# Patient Record
Sex: Female | Born: 1960 | Race: Black or African American | Hispanic: No | Marital: Single | Smoking: Never smoker
Health system: Southern US, Community
[De-identification: ages and names within clinical notes are randomized; demographics above are authoritative.]

## PROBLEM LIST (undated history)

## (undated) DIAGNOSIS — I499 Cardiac arrhythmia, unspecified: Secondary | ICD-10-CM

## (undated) DIAGNOSIS — K219 Gastro-esophageal reflux disease without esophagitis: Secondary | ICD-10-CM

## (undated) DIAGNOSIS — D649 Anemia, unspecified: Secondary | ICD-10-CM

## (undated) DIAGNOSIS — I10 Essential (primary) hypertension: Secondary | ICD-10-CM

## (undated) DIAGNOSIS — R51 Headache: Secondary | ICD-10-CM

## (undated) DIAGNOSIS — F419 Anxiety disorder, unspecified: Secondary | ICD-10-CM

## (undated) HISTORY — PX: FOOT SURGERY: SHX648

## (undated) HISTORY — DX: Essential (primary) hypertension: I10

## (undated) HISTORY — PX: WISDOM TOOTH EXTRACTION: SHX21

## (undated) HISTORY — PX: COLPOSCOPY: SHX161

## (undated) HISTORY — DX: Headache: R51

---

## 1998-07-12 ENCOUNTER — Inpatient Hospital Stay (HOSPITAL_COMMUNITY): Admission: AD | Admit: 1998-07-12 | Discharge: 1998-07-15 | Payer: Self-pay | Admitting: Obstetrics and Gynecology

## 1999-01-28 ENCOUNTER — Other Ambulatory Visit: Admission: RE | Admit: 1999-01-28 | Discharge: 1999-01-28 | Payer: Self-pay | Admitting: Obstetrics and Gynecology

## 1999-02-18 ENCOUNTER — Emergency Department (HOSPITAL_COMMUNITY): Admission: EM | Admit: 1999-02-18 | Discharge: 1999-02-18 | Payer: Self-pay | Admitting: Emergency Medicine

## 1999-02-21 ENCOUNTER — Encounter: Admission: RE | Admit: 1999-02-21 | Discharge: 1999-02-21 | Payer: Self-pay | Admitting: Emergency Medicine

## 2000-02-03 ENCOUNTER — Encounter: Payer: Self-pay | Admitting: Emergency Medicine

## 2000-02-03 ENCOUNTER — Encounter: Admission: RE | Admit: 2000-02-03 | Discharge: 2000-02-03 | Payer: Self-pay | Admitting: Emergency Medicine

## 2000-02-27 ENCOUNTER — Other Ambulatory Visit: Admission: RE | Admit: 2000-02-27 | Discharge: 2000-02-27 | Payer: Self-pay | Admitting: Obstetrics and Gynecology

## 2001-03-11 ENCOUNTER — Emergency Department (HOSPITAL_COMMUNITY): Admission: EM | Admit: 2001-03-11 | Discharge: 2001-03-11 | Payer: Self-pay | Admitting: Emergency Medicine

## 2002-02-24 ENCOUNTER — Other Ambulatory Visit: Admission: RE | Admit: 2002-02-24 | Discharge: 2002-02-24 | Payer: Self-pay | Admitting: Obstetrics and Gynecology

## 2003-06-29 ENCOUNTER — Other Ambulatory Visit: Admission: RE | Admit: 2003-06-29 | Discharge: 2003-06-29 | Payer: Self-pay | Admitting: Obstetrics and Gynecology

## 2004-08-29 ENCOUNTER — Other Ambulatory Visit: Admission: RE | Admit: 2004-08-29 | Discharge: 2004-08-29 | Payer: Self-pay | Admitting: Family Medicine

## 2005-09-29 ENCOUNTER — Encounter: Admission: RE | Admit: 2005-09-29 | Discharge: 2005-09-29 | Payer: Self-pay | Admitting: Obstetrics and Gynecology

## 2008-10-06 ENCOUNTER — Ambulatory Visit (HOSPITAL_COMMUNITY): Admission: RE | Admit: 2008-10-06 | Discharge: 2008-10-06 | Payer: Self-pay | Admitting: Obstetrics and Gynecology

## 2010-06-02 ENCOUNTER — Encounter: Payer: Self-pay | Admitting: Obstetrics and Gynecology

## 2010-06-24 ENCOUNTER — Inpatient Hospital Stay (INDEPENDENT_AMBULATORY_CARE_PROVIDER_SITE_OTHER)
Admission: RE | Admit: 2010-06-24 | Discharge: 2010-06-24 | Disposition: A | Discharge status: Home / Self Care | Payer: 59 | Source: Ambulatory Visit | Attending: Emergency Medicine | Admitting: Emergency Medicine

## 2010-06-24 DIAGNOSIS — G43009 Migraine without aura, not intractable, without status migrainosus: Secondary | ICD-10-CM

## 2010-07-02 ENCOUNTER — Encounter: Payer: Self-pay | Admitting: Internal Medicine

## 2010-07-02 ENCOUNTER — Telehealth: Payer: Self-pay | Admitting: Internal Medicine

## 2010-07-02 ENCOUNTER — Ambulatory Visit (INDEPENDENT_AMBULATORY_CARE_PROVIDER_SITE_OTHER): Payer: 59 | Admitting: Internal Medicine

## 2010-07-02 DIAGNOSIS — R079 Chest pain, unspecified: Secondary | ICD-10-CM

## 2010-07-02 DIAGNOSIS — R51 Headache: Secondary | ICD-10-CM

## 2010-07-02 DIAGNOSIS — R030 Elevated blood-pressure reading, without diagnosis of hypertension: Secondary | ICD-10-CM

## 2010-07-02 MED ORDER — SUMATRIPTAN SUCCINATE 25 MG PO TABS: 25.0000 mg | ORAL_TABLET | ORAL | Status: AC | PRN

## 2010-07-02 MED ORDER — DICLOFENAC SODIUM 50 MG PO TBEC: 50.0000 mg | DELAYED_RELEASE_TABLET | Freq: Two times a day (BID) | ORAL | Status: DC | PRN

## 2010-07-02 NOTE — Progress Notes (Signed)
  Subjective:    Patient ID: Ann [name], female    DOB: 1960/06/30, 50 y.o.   MRN: [id number]  HPI Pt is a pleasant 50 yo female who presents to clinic for evaluation of headaches. Notes ~3wk h/o intermittent ha's now daily. Location initially upper paranasal but did not respond to otc sinus medication. Recently ha's have been right retororbital and posterior. Tylenol does not help. Does not identify obvious trigger. Went to UC received prescription for midrin and prednisone but did not fill. Notes now preceeding the ha's a decrease in visual acuity and what she describes as possible field cuts in ~2 o'clock and 7 o'clock position. Sx's resolve after 15-20 mins then ha develops. Denies focal weakness or difficulty with speech. Also has noted intermittent left arm numbness unrelated to ha that seems to originate from neck. Did have left upper CP previously associated with arm numbness and wonders if related to pinched nerve.   Reviewed PMH, PSH, medications, allergies, social hx and family hx.    Review of Systems  Constitutional: Negative for fever, chills and fatigue.  HENT: Positive for congestion. Negative for ear pain, facial swelling and neck pain.   Eyes: Positive for visual disturbance. Negative for photophobia, pain and discharge.  Respiratory: Negative for cough, shortness of breath and wheezing.   Cardiovascular: Positive for chest pain. Negative for palpitations.  Gastrointestinal: Positive for nausea. Negative for vomiting, abdominal pain, diarrhea, constipation and blood in stool.  Genitourinary: Negative for hematuria, flank pain, decreased urine volume and difficulty urinating.  Musculoskeletal: Positive for arthralgias. Negative for back pain and gait problem.  Skin: Negative for color change, pallor and rash.  Neurological: Positive for numbness and headaches. Negative for dizziness, tremors, seizures, syncope, facial asymmetry, speech difficulty and weakness.  Hematological:  Negative for adenopathy. Does not bruise/bleed easily.  Psychiatric/Behavioral: Negative for behavioral problems, confusion and agitation.       Objective:   Physical Exam  [nursing notereviewed. Constitutional: She appears well-developed and well-nourished. No distress.  HENT:  Head: Normocephalic and atraumatic.  Right Ear: External ear normal.  Left Ear: External ear normal.  Nose: Nose normal.  Mouth/Throat: Oropharynx is clear and moist. No oropharyngeal exudate.  Eyes: Conjunctivae and EOM are normal. Pupils are equal, round, and reactive to light. Right eye exhibits no discharge. Left eye exhibits no discharge. No scleral icterus. Right eye exhibits no nystagmus. Left eye exhibits no nystagmus.       No field cuts noted on exam  Neck: Neck supple. No JVD present.  Cardiovascular: Normal rate, regular rhythm and normal heart sounds.  Exam reveals no gallop and no friction rub.   No murmur heard. Pulmonary/Chest: Effort normal and breath sounds normal. No respiratory distress. She has no wheezes. She has no rales.  Musculoskeletal: Normal range of motion.  Lymphadenopathy:    She has no cervical adenopathy.  Neurological: She is alert. She has normal strength. She is not disoriented. She displays no tremor. No cranial nerve deficit or sensory deficit. She exhibits normal muscle tone. Coordination and gait normal.  Skin: She is not diaphoretic.          Assessment & Plan:

## 2010-07-02 NOTE — Telephone Encounter (Signed)
Pt was just in for ov and is req to get a doctors note for her work. Pls call pt when this is ready for pickup.

## 2010-07-02 NOTE — Telephone Encounter (Signed)
Pharmacy call to verify sig on imitrex

## 2010-07-02 NOTE — Telephone Encounter (Signed)
Done and pt has picked up 

## 2010-07-03 ENCOUNTER — Encounter: Payer: Self-pay | Admitting: Internal Medicine

## 2010-07-03 DIAGNOSIS — IMO0001 Reserved for inherently not codable concepts without codable children: Secondary | ICD-10-CM | POA: Insufficient documentation

## 2010-07-03 DIAGNOSIS — R519 Headache, unspecified: Secondary | ICD-10-CM | POA: Insufficient documentation

## 2010-07-03 DIAGNOSIS — R51 Headache: Secondary | ICD-10-CM | POA: Insufficient documentation

## 2010-07-03 DIAGNOSIS — R079 Chest pain, unspecified: Secondary | ICD-10-CM | POA: Insufficient documentation

## 2010-07-03 NOTE — Assessment & Plan Note (Signed)
New onset HA pattern with visual disturbance (field cuts). Schedule brain mri. Attempt nsaid prn with food and no other nsaid for mild ha. Attempt imitrex prn more severe ha prn. Schedule formal eye exam and close followup.

## 2010-07-03 NOTE — Assessment & Plan Note (Signed)
Atypical sx's. EKG obtained demonstrates nsr 77 with nl intervals, nl axis and no evidence of acute ischemic change.

## 2010-07-03 NOTE — Assessment & Plan Note (Signed)
Isolated elevation. Monitor as outpt, record results for review and followup in clinic as scheduled.

## 2010-07-09 ENCOUNTER — Other Ambulatory Visit: Payer: 59

## 2010-07-11 ENCOUNTER — Ambulatory Visit
Admission: RE | Admit: 2010-07-11 | Discharge: 2010-07-11 | Disposition: A | Discharge status: Home / Self Care | Payer: 59 | Source: Ambulatory Visit | Attending: Internal Medicine | Admitting: Internal Medicine

## 2010-07-11 DIAGNOSIS — R51 Headache: Secondary | ICD-10-CM

## 2010-07-15 ENCOUNTER — Telehealth: Payer: Self-pay

## 2010-07-15 NOTE — Telephone Encounter (Signed)
Pt aware.

## 2010-07-15 NOTE — Telephone Encounter (Signed)
Message copied by Kyung Rudd on Mon Jul 15, 2010  5:11 PM ------      Message from: Letitia Libra, Maisie Fus      Created: Fri Jul 12, 2010 12:18 PM       pls notify mri of brain did NOT show any mass/tumor or stroke

## 2010-07-16 ENCOUNTER — Ambulatory Visit: Payer: 59 | Admitting: Internal Medicine

## 2010-08-02 ENCOUNTER — Ambulatory Visit: Payer: 59 | Admitting: Internal Medicine

## 2010-09-26 ENCOUNTER — Other Ambulatory Visit (HOSPITAL_COMMUNITY): Payer: 59

## 2010-10-01 ENCOUNTER — Ambulatory Visit (HOSPITAL_COMMUNITY): Admission: RE | Admit: 2010-10-01 | Payer: 59 | Source: Ambulatory Visit | Admitting: Obstetrics and Gynecology

## 2011-07-31 ENCOUNTER — Encounter: Payer: 59 | Admitting: Obstetrics & Gynecology

## 2011-08-07 ENCOUNTER — Encounter: Payer: Self-pay | Admitting: Obstetrics & Gynecology

## 2011-08-07 ENCOUNTER — Ambulatory Visit (INDEPENDENT_AMBULATORY_CARE_PROVIDER_SITE_OTHER): Payer: Self-pay | Admitting: Obstetrics & Gynecology

## 2011-08-07 VITALS — BP 136/72 | HR 92 | Temp 99.4°F | Ht 63.0 in | Wt 233.1 lb

## 2011-08-07 DIAGNOSIS — N920 Excessive and frequent menstruation with regular cycle: Secondary | ICD-10-CM

## 2011-08-07 DIAGNOSIS — D219 Benign neoplasm of connective and other soft tissue, unspecified: Secondary | ICD-10-CM | POA: Insufficient documentation

## 2011-08-07 DIAGNOSIS — D259 Leiomyoma of uterus, unspecified: Secondary | ICD-10-CM

## 2011-08-07 MED ORDER — MEDROXYPROGESTERONE ACETATE 10 MG PO TABS: 20.0000 mg | ORAL_TABLET | Freq: Every day | ORAL | Status: DC

## 2011-08-07 NOTE — Progress Notes (Signed)
History:  51 y.o. G3P2 here today for discussion about management of menorrhagia and fibroids.  She was a patient of Dr. Harlene Salts but lost her insurance and was sent here.  In 05/2011, she underwent endometrial biopsy and pap smear that were normal.  In-office ultrasound revealed 14 x 13 x 13 cm fibroid uterus with multiple fibroids, biggest 4-5 cm. No mention about location within uterus, but there was no difficulty in obtaining the endometrial biopsy; Dr. Solon Augusta does write that the cervix is deviated anteriorly because of a fibroid in the cul-de-sac. Patient does not want "to be cut", does not want medications/IUD, is interested in endometrial ablation.  No other associated symptoms.  The following portions of the patient's history were reviewed and updated as appropriate: allergies, current medications, past family history, past medical history, past social history, past surgical history and problem list.  Review of Systems:  A comprehensive review of systems was negative.  Objective:  Physical Exam Blood pressure 136/72, pulse 92, temperature 99.4 F (37.4 C), temperature source Oral, height 5\' 3"  (1.6 m), weight 233 lb 1.6 oz (105.733 kg), last menstrual period 07/21/2011. Gen: NAD Abd: Soft, nontender and nondistended; enlarged uterus palpated Pelvic: Deferred as per patient request  Assessment & Plan:   Discussed endometrial ablation in the form of HTA; risks/benefits reviewed in detail.    Discussed alternative surgeries with myomectomy, hysterectomy; also other modalities with hormones, Depo Lupron and she declines all these.    All questions were answered.  She was told that she will be contacted by our surgical scheduler regarding the time and date of her surgery; routine preoperative instructions of having nothing to eat or drink after midnight on the day prior to surgery and also coming to the hospital 1 1/2 hours prior to her time of surgery were also emphasized.  She was told she may  be called for a preoperative appointment about a week prior to surgery and will be given further preoperative instructions at that visit.  Printed patient education handouts about the procedure was given to the patient to review at home.  In the meantime, Provera was e-prescribed to help with the bleeding; bleeding and pain precautions reviewed.

## 2011-08-07 NOTE — Patient Instructions (Signed)
Fibroids You have been diagnosed as having a fibroid. Fibroids are smooth muscle lumps (tumors) which can occur any place in a woman's body. They are usually in the womb (uterus). The most common problem (symptom) of fibroids is bleeding. Over time this may cause low red blood cells (anemia). Other symptoms include feelings of pressure and pain in the pelvis. The diagnosis (learning what is wrong) of fibroids is made by physical exam. Sometimes tests such as an ultrasound are used. This is helpful when fibroids are felt around the ovaries and to look for tumors. TREATMENT   Most fibroids do not need surgical or medical treatment. Sometimes a tissue sample (biopsy) of the lining of the uterus is done to rule out cancer. If there is no cancer and only a small amount of bleeding, the problem can be watched.   Hormonal treatment can improve the problem.   When surgery is needed, it can consist of removing the fibroid. Vaginal birth may not be possible after the removal of fibroids. This depends on where they are and the extent of surgery. When pregnancy occurs with fibroids it is usually normal.   Your caregiver can help decide which treatments are best for you.  HOME CARE INSTRUCTIONS   Do not use aspirin as this may increase bleeding problems.   If your periods (menses) are heavy, record the number of pads or tampons used per month. Bring this information to your caregiver. This can help them determine the best treatment for you.  SEEK IMMEDIATE MEDICAL CARE IF:  You have pelvic pain or cramps not controlled with medications, or experience a sudden increase in pain.   You have an increase of pelvic bleeding between and during menses.   You feel lightheaded or have fainting spells.   You develop worsening belly (abdominal) pain.  Document Released: 04/25/2000 Document Revised: 04/17/2011 Document Reviewed: 12/16/2007 [facility] Patient Information 2012 [place], [place].  Uterine Artery  Embolization for Fibroids Uterine fibroids are non-cancerous (benign) smooth muscle tumors of the uterus. When they become large, they may produce symptoms of pain and bleeding. Fibroids are sometimes individually removed during surgery or removed with the uterus (hysterectomy).  One non-surgical treatment used to shrink fibroids is called uterine artery embolization. A specialist (interventional radiologist) uses a thin plastic hose(catheter) to inject material that blocks off the blood supply to the fibroid. In time, this causes the fibroid to shrink. PROCEDURE  Under local anesthetic (a medication that numbs part of the body) the radiologist makes a small cut in the groin. A catheter is then inserted into the main artery of the leg. Using fluoroscopy, your radiologist guides the catheter through the artery to the uterus. A series of images are taken while dye is injected. This is done to provide a road map of the blood supply to the uterus and fibroids. Tiny plastic spheres about the size of sand grains are then injected through the catheter. Metal coils may sometimes also be used to help block the artery. The particles lodge in tiny branches of the uterine artery that supplies blood to the fibroids. The procedure is repeated on the artery that supplies the other side of the uterus. The hospital stay is usually overnight. Normal activity can resume after about a week. Mild pain and cramping following the procedure is easily treated with medication and anti-inflammatory drugs. These usually last only a couple days.  RISKS AND COMPLICATIONS  Injury to the uterus from decreased blood supply may happen.   This could  require removal of the uterus (hysterectomy).   Pain and bleeding can occur.   Infection and abscess (a cyst filled with pus).   A cyst filled with blood (hematoma).   Blood infection (septicemia).   Amenorrhea (no menstrual period).   Dying of tissue cells that cannot recover  (necrosis) to the bladder or lips of the vagina.   Fistula (a connection between organs or from organ to the skin).   Blood clot in the lung (pulmonary embolus).   Rarely death.  EXPECTED OUTCOME An ultrasound or MRI is done in 6 months to make sure the fibroids have shrunk. The fibroids usually shrink to about half their original size. In most cases these effects are long lasting.   The uterus also shrinks but does not die. You may not be able to get pregnant following this procedure.   It cannot be estimated what the effects of the procedure will be on menses. Usually there is less bleeding.   The procedure may cause premature menopause or loss of menstrual cycle.  HOME CARE INSTRUCTIONS   Follow your caregiver's advice regarding medications given to you, diet, activity and when to begin sexual activity.   See your caregiver for follow up care as directed.   Do not take aspirin it can cause bleeding. Only take over-the-counter or prescription medicines for pain, discomfort, or fever as directed by your caregiver.   Care for and change dressing as directed.  SEEK MEDICAL CARE IF:   You develop a temperature of 102 F (38.9 C) or higher.   There is redness, swelling and pain around the wound.   You have pus draining from the wound.   You develop a rash.  SEEK IMMEDIATE MEDICAL CARE IF:   You have bleeding from the wound.   You have difficulty breathing.   You develop chest pain.   You develop belly (abdominal) pain.   You develop leg pain.   You become dizzy and pass out.  Document Released: 07/14/2005 Document Revised: 04/17/2011 Document Reviewed: 06/10/2007 [facility] Patient Information 2012 [place], [place].  Myomectomy Myoma is a non-cancerous tumor made up of fibrous tissue. It is also called leiomyoma, but more often called a fibroid tumor. Myomectomy is the removal of a fibroid tumor without removing another organ, like the uterus or ovary, with it. Fibroids  range from the size of a pea to a grapefruit. They are rarely cancerous. Myomas only need treatment when they are growing or when they cause symptoms, such aspain, pressure, bleeding, and pain with intercourse. LET YOUR CAREGIVER KNOW ABOUT:  Any allergies, especially to medicines.   If you develop a cold or an infection before your surgery.   Medicines taken, including vitamins, herbs, eyedrops, over-ther-counter medicines, and creams.   Use of steroids (by mouth or creams).   Previous problems with numbing medicines.   History of blood clots or other bleeding problems.   Other health problems, such as diabetes, kidney, heart, or lung problems.   Previous surgery.   Possibility of pregnancy, if this applies.  RISKS AND COMPLICATIONS   Excessive bleeding.   Infection.   Injury to other organs.   Blood clots in the legs, chest, and brain.   Scar tissue (adhesions) on other organs and in the pelvis.   Death during or after the surgery.  BEFORE THE PROCEDURE  Follow your caregiver's advice regarding your surgery and preparing for surgery.   Avoid taking aspirin or blood thinners as directed by your caregiver.  DO NOT eat or drink anything after midnight on the night before surgery, or as directed by your caregiver.   DO NOT smoke (if you smoke) for 2 weeks before the surgery.   DO NOT drink alcohol the day before the surgery.   If you are admitted the day of the surgery,arrive1 hour before your surgery is scheduled.   Arrange to have someone take you home from the hospital.   Arrange to have someone care for you when you go home.  PROCEDURE There are several ways to perform a myomectomy:  Hysteroscopy myomectomy. A lighted tube is inserted inside the uterus. The tube will remove the fibroid. This is used when the fibroid is inside the cavity of the uterus.   Laparoscopic myomectomy. A long, lighted tube is inserted through 2 or 3 small incisions to see the organs  in the pelvis. The fibroid is removed.   Myomectomy through a sugical cut (incicion) in the abdomen. The fibroid is removed through an incision made in the stomach. This way is performed when thethe fibroid cannot be removed with a hysteroscope or laprascope.  AFTER THE PROCEDURE  If you had laparoscopic or hysteroscopic myomectomy, you may go home the same day or stay overnight.   If you had abdominal myomectomy, you may stay in the hospital a few days.   Your intravenous (IV)access tube and catheter will be removed in 1 or 2 days.   If you stay in the hospital, your caregiver will order pain medicine and a sleeping pill, if needed.   You may be placed on an antibiotic medicine, if needed.   You may be given written instructions and medicines before you are sent home.  Document Released: 02/23/2007 Document Revised: 04/17/2011 Document Reviewed: 03/07/2009 [facility] Patient Information 2012 [place], [place].  Endometrial Ablation Endometrial ablation removes the lining of the uterus (endometrium). It is usually a same day, outpatient treatment. Ablation helps avoid major surgery (such as a hysterectomy). A hysterectomy is removal of the cervix and uterus. Endometrial ablation has less risk and complications, has a shorter recovery period and is less expensive. After endometrial ablation, most women will have little or no menstrual bleeding. You may not keep your fertility. Pregnancy is no longer likely after this procedure but if you are pre-menopausal, you still need to use a reliable method of birth control following the procedure because pregnancy can occur. REASONS TO HAVE THE PROCEDURE MAY INCLUDE:  Heavy periods.   Bleeding that is causing anemia.   Anovulatory bleeding, very irregular, bleeding.   Bleeding submucous fibroids (on the lining inside the uterus) if they are smaller than 3 centimeters.  REASONS NOT TO HAVE THE PROCEDURE MAY INCLUDE:  You wish to have more children.     You have a pre-cancerous or cancerous problem. The cause of any abnormal bleeding must be diagnosed before having the procedure.   You have pain coming from the uterus.   You have a submucus fibroid larger than 3 centimeters.   You recently had a baby.   You recently had an infection in the uterus.   You have a severe retro-flexed, tipped uterus and cannot insert the instrument to do the ablation.   You had a Cesarean section or deep major surgery on the uterus.   The inner cavity of the uterus is too large for the endometrial ablation instrument.  RISKS AND COMPLICATIONS   Perforation of the uterus.   Bleeding.   Infection of the uterus, bladder or vagina.  Injury to surrounding organs.   Cutting the cervix.   An air bubble to the lung (air embolus).   Pregnancy following the procedure.   Failure of the procedure to help the problem requiring hysterectomy.   Decreased ability to diagnose cancer in the lining of the uterus.  BEFORE THE PROCEDURE  The lining of the uterus must be tested to make sure there is no pre-cancerous or cancer cells present.   Medications may be given to make the lining of the uterus thinner.   Ultrasound may be used to evaluate the size and look for abnormalities of the uterus.   Future pregnancy is not desired.  PROCEDURE  There are different ways to destroy the lining of the uterus.   Resectoscope - radio frequency-alternating electric current is the most common one used.   Cryotherapy - freezing the lining of the uterus.   Heated Free Liquid - heated salt (saline) solution inserted into the uterus.   Microwave - uses high energy microwaves in the uterus.   Thermal Balloon - a catheter with a balloon tip is inserted into the uterus and filled with heated fluid.  Your caregiver will talk with you about the method used in this clinic. They will also instruct you on the pros and cons of the procedure. Endometrial ablation is  performed along with a procedure called operative hysteroscopy. A narrow viewing tube is inserted through the birth canal (vagina) and through the cervix into the uterus. A tiny camera attached to the viewing tube (hysteroscope) allows the uterine cavity to be shown on a TV monitor during surgery. Your uterus is filled with a harmless liquid to make the procedure easier. The lining of the uterus is then removed. The lining can also be removed with a resectoscope which allows your surgeon to cut away the lining of the uterus under direct vision. Usually, you will be able to go home within an hour after the procedure. HOME CARE INSTRUCTIONS   Do not drive for 24 hours.   No tampons, douching or intercourse for 2 weeks or until your caregiver approves.   Rest at home for 24 to 48 hours. You may then resume normal activities unless told differently by your caregiver.   Take your temperature two times a day for 4 days, and record it.   Take any medications your caregiver has ordered, as directed.   Use some form of contraception if you are pre-menopausal and do not want to get pregnant.  Bleeding after the procedure is normal. It varies from light spotting and mildly watery to bloody discharge for 4 to 6 weeks. You may also have mild cramping. Only take over-the-counter or prescription medicines for pain, discomfort, or fever as directed by your caregiver. Do not use aspirin, as this may aggravate bleeding. Frequent urination during the first 24 hours is normal. You will not know how effective your surgery is until at least 3 months after the surgery. SEEK IMMEDIATE MEDICAL CARE IF:   Bleeding is heavier than a normal menstrual cycle.   An oral temperature above 102 F (38.9 C) develops.   You have increasing cramps or pains not relieved with medication or develop belly (abdominal) pain which does not seem to be related to the same area of earlier cramping and pain.   You are light headed, weak or  have fainting episodes.   You develop pain in the shoulder strap areas.   You have chest or leg pain.   You have abnormal  vaginal discharge.   You have painful urination.  Document Released: 03/07/2004 Document Revised: 04/17/2011 Document Reviewed: 06/05/2007 [facility] Patient Information 2012 [place], [place].  Hysterectomy Information  A hysterectomy is a procedure where your uterus is surgically removed. It will no longer be possible to have menstrual periods or to become pregnant. The tubes and ovaries can be removed (bilateral salpingo-oopherectomy) during this surgery as well.  REASONS FOR A HYSTERECTOMY  Persistent, abnormal bleeding.   Lasting (chronic) pelvic pain or infection.   The lining of the uterus (endometrium) starts growing outside the uterus (endometriosis).   The endometrium starts growing in the muscle of the uterus (adenomyosis).   The uterus falls down into the vagina (pelvic organ prolapse).   Symptomatic uterine fibroids.   Precancerous cells.   Cervical cancer or uterine cancer.  TYPES OF HYSTERECTOMIES  Supracervical hysterectomy. This type removes the top part of the uterus, but not the cervix.   Total hysterectomy. This type removes the uterus and cervix.   Radical hysterectomy. This type removes the uterus, cervix, and the fibrous tissue that holds the uterus in place in the pelvis (parametrium).  WAYS A HYSTERECTOMY CAN BE PERFORMED  Abdominal hysterectomy. A large surgical cut (incision) is made in the abdomen. The uterus is removed through this incision.   Vaginal hysterectomy. An incision is made in the vagina. The uterus is removed through this incision. There are no abdominal incisions.   Conventional laparoscopic hysterectomy. A thin, lighted tube with a camera (laparoscope) is inserted into 3 or 4 small incisions in the abdomen. The uterus is cut into small pieces. The small pieces are removed through the incisions, or they are removed  through the vagina.   Laparoscopic assisted vaginal hysterectomy (LAVH). Three or four small incisions are made in the abdomen. Part of the surgery is performed laparoscopically and part vaginally. The uterus is removed through the vagina.   Robot-assisted laparoscopic hysterectomy. A laparoscope is inserted into 3 or 4 small incisions in the abdomen. A computer-controlled device is used to give the surgeon a 3D image. This allows for more precise movements of surgical instruments. The uterus is cut into small pieces and removed through the incisions or removed through the vagina.  RISKS OF HYSTERECTOMY   Bleeding and risk of blood transfusion. Tell your caregiver if you do not want to receive any blood products.   Blood clots in the legs or lung.   Infection.   Injury to surrounding organs.   Anesthesia problems or side effects.   Conversion to an abdominal hysterectomy.  WHAT TO EXPECT AFTER A HYSTERECTOMY  You will be given pain medicine.   You will need to have someone with you for the first 3 to 5 days after you go home.   You will need to follow up with your surgeon in 2 to 4 weeks after surgery to evaluate your progress.   You may have early menopause symptoms like hot flashes, night sweats, and insomnia.   If you had a hysterectomy for a problem that was not a cancer or a condition that could lead to cancer, then you no longer need Pap tests. However, even if you no longer need a Pap test, a regular exam is a good idea to make sure no other problems are starting.  Document Released: 10/22/2000 Document Revised: 04/17/2011 Document Reviewed: 12/07/2010 [facility] Patient Information 2012 [place], [place].

## 2011-08-18 ENCOUNTER — Encounter (HOSPITAL_COMMUNITY): Payer: Self-pay | Admitting: Pharmacist

## 2011-08-18 ENCOUNTER — Ambulatory Visit (INDEPENDENT_AMBULATORY_CARE_PROVIDER_SITE_OTHER): Payer: Self-pay | Admitting: Advanced Practice Midwife

## 2011-08-18 ENCOUNTER — Encounter: Payer: Self-pay | Admitting: Advanced Practice Midwife

## 2011-08-18 VITALS — BP 142/95 | HR 80 | Temp 98.1°F | Ht 61.0 in | Wt 229.4 lb

## 2011-08-18 DIAGNOSIS — N760 Acute vaginitis: Secondary | ICD-10-CM | POA: Insufficient documentation

## 2011-08-18 DIAGNOSIS — Z113 Encounter for screening for infections with a predominantly sexual mode of transmission: Secondary | ICD-10-CM

## 2011-08-18 DIAGNOSIS — N920 Excessive and frequent menstruation with regular cycle: Secondary | ICD-10-CM

## 2011-08-18 DIAGNOSIS — B373 Candidiasis of vulva and vagina: Secondary | ICD-10-CM

## 2011-08-18 LAB — POCT PREGNANCY, URINE: Preg Test, Ur: NEGATIVE

## 2011-08-18 MED ORDER — TERCONAZOLE 80 MG VA SUPP: 80.0000 mg | Freq: Every day | VAGINAL | Status: AC

## 2011-08-18 NOTE — Progress Notes (Signed)
  Subjective:    Ann [name] is a 51 y.o. female who presents for sexually transmitted disease check. Sexual history reviewed with the patient. STI Exposure: sexual contact with individual with uncertain background 1 week ago. Previous history of STI none. Current symptoms vaginal discharge: white, vaginal irritation: moderate. Contraception: none Menstrual History: OB History    Grav Para Term Preterm Abortions TAB SAB Ect Mult Living                   Patient's last menstrual period was 07/21/2011.    The following portions of the patient's history were reviewed and updated as appropriate: allergies, current medications, past family history, past medical history, past social history, past surgical history and problem list.  Review of Systems Pertinent items are noted in HPI.    Objective:    BP 142/95  Pulse 80  Temp(Src) 98.1 F (36.7 C) (Oral)  Ht 5\' 1"  (1.549 m)  Wt 229 lb 6.4 oz (104.055 kg)  BMI 43.34 kg/m2  LMP 07/21/2011 General:   alert and no distress  Lymph Nodes:   Cervical, supraclavicular, and axillary nodes normal.  Pelvis:  Vulva and vagina appear normal. Bimanual exam reveals normal uterus and adnexa. External genitalia: normal general appearance Cervix: normal appearance  Cultures:  GC and Chlamydia genprobes and wet prep      Assessment:    Possible STD exposure    Plan:    See orders for STD cultures and assays RTC PRN   Concerned over cost of surgery. Financial forms willnot be reviewed for 4 wks. Instructed to talk to billing office

## 2011-08-18 NOTE — Patient Instructions (Signed)
Vaginitis Vaginitis is an infection. It causes soreness, swelling, and redness (inflammation) of the vagina. Many of these infections are sexually transmitted diseases (STDs). Having unprotected sex can cause further problems and complications such as:  Chronic pelvic pain.   Infertility.   Unwanted pregnancy.   Abortion.   Tubal pregnancy.   Infection passed on to the newborn.   Cancer.  CAUSES   Monilia. This is a yeast or fungus infection, not an STD.   Bacterial vaginosis. The normal balance of bacteria in the vagina is disrupted and is replaced by an overgrowth of certain bacteria.   Gonorrhea, chlamydia. These are bacterial infections that are STDs.   Vaginal sponges, diaphragms, and intrauterine devices.   Trichomoniasis. This is a STD infection caused by a parasite.   Viruses like herpes and human papillomavirus. Both are STDs.   Pregnancy.   Immunosuppression. This occurs with certain conditions such as HIV infection or cancer.   Using bubble bath.   Taking certain antibiotic medicines.   Sporadic recurrence can occur if you become sick.   Diabetes.   Steroids.   Allergic reaction. If you have an allergy to:   Douches.   Soaps.   Spermicides.   Condoms.   Scented tampons or vaginal sprays.  SYMPTOMS   Abnormal vaginal discharge.   Itching of the vagina.   Pain in the vagina.   Swelling of the vagina.  In some cases, there are no symptoms. TREATMENT  Treatment will vary depending on the type of infection.  Bacteria or trichomonas are usually treated with oral antibiotics and sometimes vaginal cream or suppositories.   Monilia vaginitis is usually treated with vaginal creams, suppositories, or oral antifungal pills.   Viral vaginitis has no cure. However, the symptoms of herpes (a viral vaginitis) can be treated to relieve the discomfort. Human papillomavirus has no symptoms. However, there are treatments for the diseases caused by human  papillomavirus.   With allergic vaginitis, you need to stop using the product that is causing the problem. Vaginal creams can be used to treat the symptoms.   When treating an STD, the sex partner should also be treated.  HOME CARE INSTRUCTIONS   Take all the medicines as directed by your caregiver.   Do not use scented tampons, soaps, or vaginal sprays.   Do not douche.   Tell your sex partner if you have a vaginal infection or an STD.   Do not have sexual intercourse until you have treated the vaginitis.   Practice safe sex by using condoms.  SEEK MEDICAL CARE IF:   You have abdominal pain.   Your symptoms get worse during treatment.  Document Released: 02/23/2007 Document Revised: 04/17/2011 Document Reviewed: 10/19/2008 [employer] Patient Information 2012 [employer]. 

## 2011-08-19 LAB — WET PREP, GENITAL

## 2011-08-25 ENCOUNTER — Other Ambulatory Visit (HOSPITAL_COMMUNITY): Payer: Self-pay

## 2011-08-28 ENCOUNTER — Encounter (HOSPITAL_COMMUNITY): Admission: RE | Payer: Self-pay | Source: Ambulatory Visit

## 2011-08-28 ENCOUNTER — Ambulatory Visit (HOSPITAL_COMMUNITY): Admission: RE | Admit: 2011-08-28 | Payer: Self-pay | Source: Ambulatory Visit | Admitting: Obstetrics & Gynecology

## 2011-08-28 SURGERY — DILATATION & CURETTAGE/HYSTEROSCOPY WITH HYDROTHERMAL ABLATION
Anesthesia: Choice | Site: Vagina

## 2012-08-06 ENCOUNTER — Encounter (HOSPITAL_COMMUNITY): Payer: Self-pay | Admitting: *Deleted

## 2012-08-06 ENCOUNTER — Emergency Department (HOSPITAL_COMMUNITY)
Admission: EM | Admit: 2012-08-06 | Discharge: 2012-08-06 | Disposition: A | Discharge status: Home / Self Care | Payer: Self-pay | Source: Home / Self Care | Attending: Family Medicine | Admitting: Family Medicine

## 2012-08-06 DIAGNOSIS — M62838 Other muscle spasm: Secondary | ICD-10-CM | POA: Insufficient documentation

## 2012-08-06 DIAGNOSIS — M25519 Pain in unspecified shoulder: Secondary | ICD-10-CM

## 2012-08-06 DIAGNOSIS — D259 Leiomyoma of uterus, unspecified: Secondary | ICD-10-CM

## 2012-08-06 DIAGNOSIS — M25512 Pain in left shoulder: Secondary | ICD-10-CM

## 2012-08-06 DIAGNOSIS — R51 Headache: Secondary | ICD-10-CM

## 2012-08-06 DIAGNOSIS — I10 Essential (primary) hypertension: Secondary | ICD-10-CM

## 2012-08-06 DIAGNOSIS — D219 Benign neoplasm of connective and other soft tissue, unspecified: Secondary | ICD-10-CM

## 2012-08-06 DIAGNOSIS — R030 Elevated blood-pressure reading, without diagnosis of hypertension: Secondary | ICD-10-CM

## 2012-08-06 MED ORDER — ACETAMINOPHEN 325 MG PO TABS: 650.0000 mg | ORAL_TABLET | Freq: Four times a day (QID) | ORAL | Status: DC | PRN

## 2012-08-06 MED ORDER — LISINOPRIL-HYDROCHLOROTHIAZIDE 10-12.5 MG PO TABS: 1.0000 | ORAL_TABLET | Freq: Every day | ORAL | Status: DC

## 2012-08-06 MED ORDER — CYCLOBENZAPRINE HCL 5 MG PO TABS: 5.0000 mg | ORAL_TABLET | Freq: Every evening | ORAL | Status: DC | PRN

## 2012-08-06 NOTE — ED Provider Notes (Signed)
History     CSN: [id number]  Arrival date & time 08/06/12  1511   First MD Initiated Contact with Patient 08/06/12 1531      Chief Complaint  Patient presents with  . Shoulder Pain   Patient is a 52 y.o. female presenting with shoulder pain.  Shoulder Pain   Pt reports that she has been having neck pain to left side of neck to left shoulder and elbow.  Pt reports that she only feels discomfort with certain twisting and pulling movements of the neck and shoulder.  No loss of strength.  No chest pain or shortness of breath.  No headaches reported.   Past Medical History  Diagnosis Date  . Headache        Uterine fibroids   History reviewed. No pertinent past surgical history.  Family History  Problem Relation Age of Onset  . Arthritis Mother   . Stroke Mother   . Hypertension Mother   . Arthritis Father   . Cancer Father     prostate  . Stroke Father   . Alcohol abuse Brother   . Stroke Brother   . Cancer Paternal Aunt     breast and uterine    History  Substance Use Topics  . Smoking status: Never Smoker   . Smokeless tobacco: Never Used  . Alcohol Use: Yes    OB History   Grav Para Term Preterm Abortions TAB SAB Ect Mult Living                 Review of Systems  Constitutional: Negative.   HENT: Negative.   Musculoskeletal: Positive for myalgias and arthralgias.  All other systems reviewed and are negative.    Allergies  Review of patient's allergies indicates no known allergies.  Home Medications   Current Outpatient Rx  Name  Route  Sig  Dispense  Refill  . acetaminophen (TYLENOL) 500 MG tablet   Oral   Take 500 mg by mouth every 6 (six) hours as needed.           . Cholecalciferol (VITAMIN D PO)   Oral   Take 1 tablet by mouth once a week.         . IBUPROFEN PO   Oral   Take 500 mg by mouth as needed. Prn pain         . medroxyPROGESTERone (PROVERA) 10 MG tablet   Oral   Take 2 tablets (20 mg total) by mouth daily.   30  tablet   2   . Multiple Vitamin (MULTIVITAMIN) tablet   Oral   Take 1 tablet by mouth daily.           BP 165/96  Pulse 81  Temp(Src) 98.3 F (36.8 C) (Oral)  SpO2 100%  LMP 05/20/2012  Physical Exam  Nursing note and vitals reviewed. Constitutional: She is oriented to person, place, and time. She appears well-developed and well-nourished.  HENT:  Head: Normocephalic and atraumatic.  Eyes: Conjunctivae and EOM are normal. Pupils are equal, round, and reactive to light.  Neck: Normal range of motion. Neck supple.  Cardiovascular: Normal rate, regular rhythm and normal heart sounds.   Pulmonary/Chest: Effort normal and breath sounds normal.  Abdominal: Soft. Bowel sounds are normal.  Musculoskeletal: Normal range of motion. She exhibits tenderness. She exhibits no edema.       Right shoulder: She exhibits tenderness. She exhibits normal range of motion, no bony tenderness, no swelling, no effusion and no crepitus.  Arms: Neurological: She is alert and oriented to person, place, and time. No cranial nerve deficit. Coordination normal.  Skin: Skin is warm and dry.  Psychiatric: She has a normal mood and affect. Her behavior is normal. Judgment and thought content normal.   ED Course  Procedures (including critical care time)  Labs Reviewed - No data to display No results found.  No diagnosis found.  MDM  IMPRESSION Hypertension, untreated  Neck spasm, Neck pain Muscle spasm   RECOMMENDATIONS / PLAN Treat hypertension today zestoretic 10/12.5 - take 1 po daily Trial of acetaminophen 625 mg po every 6 hours prn pain Cyclobenzaprine 5 mg po every evening as needed for spasm RTC in 3 days if no improvement  FOLLOW UP 2 weeks for BP check 1 month for office visit for HTN  The patient was given clear instructions to go to ER or return to medical center if symptoms don't improve, worsen or new problems develop.  The patient verbalized understanding.  The patient  was told to call to get lab results if they haven't heard anything in the next week.           Cleora Fleet, MD 08/07/12 1104

## 2012-08-06 NOTE — ED Notes (Signed)
Pt states pain in left shoulder extending to neck and midway into her back X 2 days.

## 2012-08-09 NOTE — ED Notes (Signed)
Patient called stating her flexeril  Script was not sent to [facility]-pat the nurse called it in to [facility] @ [phone]

## 2012-08-18 ENCOUNTER — Emergency Department (INDEPENDENT_AMBULATORY_CARE_PROVIDER_SITE_OTHER): Payer: Self-pay

## 2012-08-18 ENCOUNTER — Emergency Department (HOSPITAL_COMMUNITY)
Admission: EM | Admit: 2012-08-18 | Discharge: 2012-08-18 | Disposition: A | Discharge status: Home / Self Care | Payer: Self-pay | Source: Home / Self Care

## 2012-08-18 ENCOUNTER — Encounter (HOSPITAL_COMMUNITY): Payer: Self-pay

## 2012-08-18 DIAGNOSIS — M25519 Pain in unspecified shoulder: Secondary | ICD-10-CM

## 2012-08-18 DIAGNOSIS — M25512 Pain in left shoulder: Secondary | ICD-10-CM

## 2012-08-18 DIAGNOSIS — M62838 Other muscle spasm: Secondary | ICD-10-CM

## 2012-08-18 MED ORDER — OXYCODONE-ACETAMINOPHEN 5-325 MG PREPACK: 1.0000 | ORAL_TABLET | Freq: Three times a day (TID) | ORAL | Status: DC | PRN

## 2012-08-18 MED ORDER — CYCLOBENZAPRINE HCL 5 MG PO TABS: 5.0000 mg | ORAL_TABLET | Freq: Three times a day (TID) | ORAL | Status: DC | PRN

## 2012-08-18 NOTE — ED Provider Notes (Signed)
History     CSN: [id number]  Arrival date & time 08/18/12  1224   First MD Initiated Contact with Patient 08/18/12 1241      Chief Complaint  Patient presents with  . Neck Pain    (Consider location/radiation/quality/duration/timing/severity/associated sxs/prior treatment) HPI Pt is 52 yo female who presents with main concern of progressively worsening neck pain, intermittent and sharp, radiating to shoulders and worse with movement, no specific alleviating factors, no similar events in the past. She denies any specific focal neurological weakness, no specific systemic symptoms. No known traumas.  Past Medical History  Diagnosis Date  . Headache     History reviewed. No pertinent past surgical history.  Family History  Problem Relation Age of Onset  . Arthritis Mother   . Stroke Mother   . Hypertension Mother   . Arthritis Father   . Cancer Father     prostate  . Stroke Father   . Alcohol abuse Brother   . Stroke Brother   . Cancer Paternal Aunt     breast and uterine    History  Substance Use Topics  . Smoking status: Never Smoker   . Smokeless tobacco: Never Used  . Alcohol Use: Yes    OB History   Grav Para Term Preterm Abortions TAB SAB Ect Mult Living                  Review of Systems Constitutional: Negative for fever, chills, diaphoresis, activity change, appetite change and fatigue.  HENT: Negative for ear pain, nosebleeds, congestion, facial swelling, rhinorrhea, neck pain, neck stiffness and ear discharge.   Eyes: Negative for pain, discharge, redness, itching and visual disturbance.  Respiratory: Negative for cough, choking, chest tightness, shortness of breath, wheezing and stridor.   Cardiovascular: Negative for chest pain, palpitations and leg swelling.  Gastrointestinal: Negative for abdominal distention.  Genitourinary: Negative for dysuria, urgency, frequency, hematuria, flank pain, decreased urine volume, difficulty urinating and  dyspareunia.  Musculoskeletal: Negative for back pain, joint swelling, arthralgias and gait problem.  Neurological: Negative for dizziness, tremors, seizures, syncope, facial asymmetry, speech difficulty, weakness, light-headedness, numbness and headaches.  Hematological: Negative for adenopathy. Does not bruise/bleed easily.  Psychiatric/Behavioral: Negative for hallucinations, behavioral problems, confusion, dysphoric mood, decreased concentration and agitation.    Allergies  Review of patient's allergies indicates no known allergies.  Home Medications   Current Outpatient Rx  Name  Route  Sig  Dispense  Refill  . acetaminophen (TYLENOL) 325 MG tablet   Oral   Take 2 tablets (650 mg total) by mouth every 6 (six) hours as needed for pain.   30 tablet      . Cholecalciferol (VITAMIN D PO)   Oral   Take 1 tablet by mouth once a week.         . cyclobenzaprine (FLEXERIL) 5 MG tablet   Oral   Take 1 tablet (5 mg total) by mouth 3 (three) times daily as needed for muscle spasms.   65 tablet   0   . IBUPROFEN PO   Oral   Take 500 mg by mouth as needed. Prn pain         . lisinopril-hydrochlorothiazide (ZESTORETIC) 10-12.5 MG per tablet   Oral   Take 1 tablet by mouth daily.   30 tablet   3   . EXPIRED: medroxyPROGESTERone (PROVERA) 10 MG tablet   Oral   Take 2 tablets (20 mg total) by mouth daily.   30 tablet  2   . Multiple Vitamin (MULTIVITAMIN) tablet   Oral   Take 1 tablet by mouth daily.         Marland Kitchen oxyCODONE-acetaminophen (PERCOCET) 5-325 mg TABS   Oral   Take 6 tablets by mouth every 8 (eight) hours as needed.   45 tablet   0     BP 125/83  Pulse 93  Temp(Src) 97.7 F (36.5 C) (Oral)  Resp 17  SpO2 100%  LMP 05/20/2012  Physical Exam  Constitutional: Appears well-developed and well-nourished. No distress.  HENT: Normocephalic. External right and left ear normal. Oropharynx is clear and moist.  Eyes: Conjunctivae and EOM are normal. PERRLA,  no scleral icterus.  Neck: ROM difficult to examine due to pain. Neck supple. No JVD. No tracheal deviation. No thyromegaly.  CVS: RRR, S1/S2 +, no murmurs, no gallops, no carotid bruit.  Pulmonary: Effort and breath sounds normal, no stridor, rhonchi, wheezes, rales.  Abdominal: Soft. BS +,  no distension, tenderness, rebound or guarding.  Musculoskeletal: Normal range of motion. No edema and no tenderness.  Lymphadenopathy: No lymphadenopathy noted, cervical, inguinal. Neuro: Alert. Normal reflexes, muscle tone coordination. No cranial nerve deficit. Skin: Skin is warm and dry. No rash noted. Not diaphoretic. No erythema. No pallor.  Psychiatric: Normal mood and affect. Behavior, judgment, thought content normal.    ED Course  Procedures (including critical care time)  Labs Reviewed - No data to display No results found.   1. Shoulder pain, left - no acute findings on xray, analgesia as needed, discussed physical therapy, pt will decide and let us know   2. Neck muscle spasm - analgesia as needed      MDM  Refill on medications, neck pain

## 2012-08-18 NOTE — ED Notes (Signed)
Patient states still having pain in her left side of neck and shoulder area Denies any injury

## 2012-11-17 ENCOUNTER — Ambulatory Visit: Payer: Medicaid Other | Attending: Family Medicine | Admitting: Internal Medicine

## 2012-11-17 ENCOUNTER — Other Ambulatory Visit (HOSPITAL_COMMUNITY)
Admission: RE | Admit: 2012-11-17 | Discharge: 2012-11-17 | Disposition: A | Discharge status: Home / Self Care | Payer: No Typology Code available for payment source | Source: Ambulatory Visit | Attending: Family Medicine | Admitting: Family Medicine

## 2012-11-17 ENCOUNTER — Encounter: Payer: Self-pay | Admitting: Internal Medicine

## 2012-11-17 ENCOUNTER — Other Ambulatory Visit (HOSPITAL_COMMUNITY)
Admission: RE | Admit: 2012-11-17 | Discharge: 2012-11-17 | Disposition: A | Discharge status: Home / Self Care | Payer: Medicaid Other | Source: Ambulatory Visit | Attending: Internal Medicine | Admitting: Internal Medicine

## 2012-11-17 VITALS — BP 108/72 | HR 74 | Temp 98.7°F | Resp 16 | Ht 62.0 in | Wt 216.0 lb

## 2012-11-17 DIAGNOSIS — N760 Acute vaginitis: Secondary | ICD-10-CM

## 2012-11-17 DIAGNOSIS — Z0000 Encounter for general adult medical examination without abnormal findings: Secondary | ICD-10-CM | POA: Insufficient documentation

## 2012-11-17 DIAGNOSIS — Z1151 Encounter for screening for human papillomavirus (HPV): Secondary | ICD-10-CM | POA: Insufficient documentation

## 2012-11-17 DIAGNOSIS — Z23 Encounter for immunization: Secondary | ICD-10-CM | POA: Insufficient documentation

## 2012-11-17 DIAGNOSIS — Z113 Encounter for screening for infections with a predominantly sexual mode of transmission: Secondary | ICD-10-CM | POA: Insufficient documentation

## 2012-11-17 DIAGNOSIS — E669 Obesity, unspecified: Secondary | ICD-10-CM | POA: Insufficient documentation

## 2012-11-17 DIAGNOSIS — Z01419 Encounter for gynecological examination (general) (routine) without abnormal findings: Secondary | ICD-10-CM | POA: Insufficient documentation

## 2012-11-17 DIAGNOSIS — B3731 Acute candidiasis of vulva and vagina: Secondary | ICD-10-CM | POA: Insufficient documentation

## 2012-11-17 DIAGNOSIS — I10 Essential (primary) hypertension: Secondary | ICD-10-CM | POA: Insufficient documentation

## 2012-11-17 DIAGNOSIS — B373 Candidiasis of vulva and vagina: Secondary | ICD-10-CM | POA: Insufficient documentation

## 2012-11-17 LAB — LIPID PANEL
HDL: 64 mg/dL (ref >39)
Triglycerides: 80 mg/dL (ref <150)

## 2012-11-17 LAB — POCT URINE PREGNANCY: Preg Test, Ur: NEGATIVE

## 2012-11-17 LAB — BASIC METABOLIC PANEL
Potassium: 4.2 mEq/L (ref 3.5–5.3)
Sodium: 136 mEq/L (ref 135–145)

## 2012-11-17 LAB — CBC
MCHC: 33.6 g/dL (ref 30.0–36.0)
Platelets: 264 10*3/uL (ref 150–400)
RDW: 15.3 % (ref 11.5–15.5)

## 2012-11-17 LAB — HEMOGLOBIN A1C
Hgb A1c MFr Bld: 5.3 % (ref <5.7)
Mean Plasma Glucose: 105 mg/dL (ref <117)

## 2012-11-17 MED ORDER — FLUCONAZOLE 100 MG PO TABS: 100.0000 mg | ORAL_TABLET | Freq: Every day | ORAL | Status: DC

## 2012-11-17 NOTE — Progress Notes (Signed)
Pt here for annual physical. Denies pain. vss

## 2012-11-17 NOTE — Patient Instructions (Signed)
Low impact exercise 30 minutes a day 5 times a week, low carbohydrate heart healthy diet.  Call back in 2 days to get all test results. Followup with recommended GI physician in [facility] physician for colonoscopy, mammogram and Pap smear.

## 2012-11-17 NOTE — Progress Notes (Signed)
Patient ID: Ann [name], female   DOB: 03-06-1961, 52 y.o.   MRN: [id number] Patient Demographics  Ann [name], is a 52 y.o. female  WUJ:[id number]  [id number]  DOB - [birth year]  Chief Complaint  Patient presents with  . Annual Exam        Subjective:   Subjective and review of systems.  [name] with History of hypertension, obesity, recurrent vaginitis is here for routine and will history and physical, her only complaint is some intermittent vaginal discharge which is itchy at times. She has had multiple infections with candida in the past.  Denies any subjective complaints except as above, no active headache, no chest abdominal pain at this time, not short of breath. No focal weakness which is new. Full 10 point review of systems was obtained except as above all other review of systems is negative.  Objective:   Past medical and surgical history      Date Noted  . Hypertension 08/06/2012  . Neck muscle spasm 08/06/2012  . Shoulder pain 08/06/2012  . Vaginitis 08/18/2011  . Fibroids 08/07/2011  . Menorrhagia 08/07/2011  . Headache 07/03/2010  . Chest pain 07/03/2010  . Elevated BP 07/03/2010   Personal history. Denies regular smoking, occasional wine intake.  Family history positive for CVA in her brother in his 23s, no history of breast cancer or colon cancer  Filed Vitals:   11/17/12 0923  BP: 108/72  Pulse: 74  Temp: 98.7 F (37.1 C)  TempSrc: Oral  Resp: 16  Height: 5\' 2"  (1.575 m)  Weight: 216 lb (97.977 kg)  SpO2: 100%     Exam  Awake Alert, Oriented X 3, No new F.N deficits, Normal affect [place].AT,PERRAL Bilateral breast exam unremarkable no palpable lumps or masses, no palpable lymph nodes cervical or axillary area. Supple Neck,No JVD, No cervical lymphadenopathy appriciated.  Symmetrical Chest wall movement, Good air movement bilaterally, CTAB RRR,No Gallops,Rubs or new Murmurs, No Parasternal Heave +ve B.Sounds, Abd Soft, Non tender,  No organomegaly appriciated, No rebound - guarding or rigidity. No Cyanosis, Clubbing or edema, No new Rash or bruise   Bimanual vaginal exam reveals no adnexal tenderness, scant yellowish discharge no foul smell.    Data Review   CBC No results found for this basename: WBC, HGB, HCT, PLT, MCV, MCH, MCHC, RDW, NEUTRABS, LYMPHSABS, MONOABS, EOSABS, BASOSABS, BANDABS, BANDSABD,  in the last 168 hours  Chemistries   No results found for this basename: NA, K, CL, CO2, GLUCOSE, BUN, CREATININE, GFRCGP, CALCIUM, MG, AST, ALT, ALKPHOS, BILITOT,  in the last 168 hours ------------------------------------------------------------------------------------------------------------------ No results found for this basename: HGBA1C,  in the last 72 hours ------------------------------------------------------------------------------------------------------------------ No results found for this basename: CHOL, HDL, LDLCALC, TRIG, CHOLHDL, LDLDIRECT,  in the last 72 hours ------------------------------------------------------------------------------------------------------------------ No results found for this basename: TSH, T4TOTAL, FREET3, T3FREE, THYROIDAB,  in the last 72 hours ------------------------------------------------------------------------------------------------------------------ No results found for this basename: VITAMINB12, FOLATE, FERRITIN, TIBC, IRON, RETICCTPCT,  in the last 72 hours  Coagulation profile  No results found for this basename: INR, PROTIME,  in the last 168 hours     Prior to Admission medications   Medication Sig Start Date End Date Taking? Authorizing Provider  acetaminophen (TYLENOL) 325 MG tablet Take 2 tablets (650 mg total) by mouth every 6 (six) hours as needed for pain. 08/06/12   Clanford Cyndie Mull, MD  Cholecalciferol (VITAMIN D PO) Take 1 tablet by mouth once a week.    Historical Provider, MD  cyclobenzaprine (FLEXERIL) 5  MG tablet Take 1 tablet (5 mg  total) by mouth 3 (three) times daily as needed for muscle spasms. 08/18/12   Dorothea Ogle, MD  fluconazole (DIFLUCAN) 100 MG tablet Take 1 tablet (100 mg total) by mouth daily. 11/17/12   Leroy Sea, MD  IBUPROFEN PO Take 500 mg by mouth as needed. Prn pain    Historical Provider, MD  lisinopril-hydrochlorothiazide (ZESTORETIC) 10-12.5 MG per tablet Take 1 tablet by mouth daily. 08/06/12   Clanford Cyndie Mull, MD  medroxyPROGESTERone (PROVERA) 10 MG tablet Take 2 tablets (20 mg total) by mouth daily. 08/07/11 08/06/12  Tereso Newcomer, MD  Multiple Vitamin (MULTIVITAMIN) tablet Take 1 tablet by mouth daily.    Historical Provider, MD  oxyCODONE-acetaminophen (PERCOCET) 5-325 mg TABS Take 6 tablets by mouth every 8 (eight) hours as needed. 08/18/12   Dorothea Ogle, MD     Assessment & Plan   1. Hypertension. Stable continue present lisinopril HCTZ combination.   2. Annual history and physical. Ordered referrals for mammogram, Pap smear and colonoscopy. Ordered baseline labs CBC, BMP, lipid panel, TSH and A1c. She received tetanus vaccineTDaVP.   3. Recurrent Candida infection. Gave her Diflucan prescription we'll treat her for 5 days as this has been recurrent for the last 6 months, have sent out urine ancillary specimen for STD testing. Patient to come back in couple of weeks for followup results. She's been told to call in 2 days to get results over the phone also.   4. Obesity counseled on diet and low impact exercise 35 minutes today at least 5 times a week.    Leroy Sea M.D on 11/17/2012 at 9:38 AM

## 2012-11-18 LAB — URINALYSIS W MICROSCOPIC + REFLEX CULTURE
Bacteria, UA: NONE SEEN
Bilirubin Urine: NEGATIVE
Casts: NONE SEEN
Glucose, UA: NEGATIVE mg/dL
Hgb urine dipstick: NEGATIVE
Ketones, ur: NEGATIVE mg/dL
Nitrite: NEGATIVE
pH: 5.5 (ref 5.0–8.0)

## 2012-11-19 ENCOUNTER — Telehealth: Payer: Self-pay | Admitting: Family Medicine

## 2012-11-19 LAB — URINE CULTURE
Colony Count: NO GROWTH
Organism ID, Bacteria: NO GROWTH

## 2012-11-19 NOTE — Telephone Encounter (Signed)
Patient given results Already taking medication

## 2012-11-19 NOTE — Telephone Encounter (Signed)
Pt was here 11/17/12 and had tetnus shot and bloodwork done and was wondering if there was an additional cost for TB shot because she wants a vaccine. Please call back to clarify why she did not receive during visit and if there is an additional charge if she comes back.

## 2012-11-19 NOTE — Telephone Encounter (Signed)
Message copied by Lestine Mount on Fri Nov 19, 2012  5:38 PM ------      Message from: Cleora Fleet      Created: Fri Nov 19, 2012  5:09 PM       Please inform patient that her Pap test came back negative.  There was a positive yeast test from the findings.  Recommend she take fluconazole 150 mg take one by mouth x1 dose.  Dispense #1 no refills.  Please call if her patient.  Repeat Pap test in 3 years.            Rodney Langton, MD, CDE, FAAFP      [facility]      [facility]      [place], [place]        ------

## 2012-11-19 NOTE — Progress Notes (Signed)
Quick Note:  Please inform patient that her Pap test came back negative. There was a positive yeast test from the findings. Recommend she take fluconazole 150 mg take one by mouth x1 dose. Dispense #1 no refills. Please call if her patient. Repeat Pap test in 3 years.  Rodney Langton, MD, CDE, FAAFP [facility] [place], [place]   ______

## 2012-11-22 ENCOUNTER — Ambulatory Visit: Payer: Medicaid Other | Attending: Family Medicine

## 2012-11-22 ENCOUNTER — Telehealth: Payer: Self-pay

## 2012-11-22 DIAGNOSIS — Z0000 Encounter for general adult medical examination without abnormal findings: Secondary | ICD-10-CM

## 2012-11-22 NOTE — Telephone Encounter (Signed)
Patient aware of lab results Has taken all of her medication

## 2012-11-22 NOTE — Telephone Encounter (Signed)
Message copied by Lestine Mount on Mon Nov 22, 2012 12:40 PM ------      Message from: [facility]      Created: Mon Nov 22, 2012  8:46 AM       Please let the patient know her cultures came back positive for Candida for which she is being treated already ------

## 2012-11-22 NOTE — Progress Notes (Signed)
Quick Note:  Please let the patient know her cultures came back positive for Candida for which she is being treated already ______

## 2012-11-24 ENCOUNTER — Ambulatory Visit: Payer: Medicaid Other | Attending: Family Medicine

## 2012-11-24 ENCOUNTER — Encounter: Payer: Self-pay | Admitting: Internal Medicine

## 2012-11-24 ENCOUNTER — Encounter: Payer: Self-pay | Admitting: *Deleted

## 2012-11-24 DIAGNOSIS — Z0000 Encounter for general adult medical examination without abnormal findings: Secondary | ICD-10-CM

## 2012-11-24 LAB — TB SKIN TEST
Induration: 0 mm
TB Skin Test: NEGATIVE

## 2012-11-24 NOTE — Progress Notes (Signed)
Patient's TB skin test is negative. Letter printed.

## 2012-11-26 ENCOUNTER — Ambulatory Visit: Payer: Medicaid Other

## 2012-12-23 ENCOUNTER — Telehealth: Payer: Self-pay | Admitting: Family Medicine

## 2012-12-23 NOTE — Telephone Encounter (Signed)
Pt needs a refill on lisinopril-hydrochlorothiazide (ZESTORETIC) 10-12.5 MG per tablet Today please; pt would like script sent to [facility] in [place] please; pt was treated for a yeast infection early July and there has been a reoccurance; pt would like to be advised on what to do about infection.

## 2012-12-23 NOTE — Telephone Encounter (Signed)
12/23/12  Spoke with patient  Regarding refill on medication has an appointment schedule for Monday 12/27/12  P.[facility] BSN [facility]

## 2012-12-24 ENCOUNTER — Encounter: Payer: Self-pay | Admitting: Internal Medicine

## 2012-12-24 ENCOUNTER — Ambulatory Visit: Payer: No Typology Code available for payment source | Attending: Family Medicine | Admitting: Internal Medicine

## 2012-12-24 VITALS — BP 125/84 | HR 87 | Temp 98.6°F | Resp 16 | Ht 62.0 in | Wt 219.8 lb

## 2012-12-24 DIAGNOSIS — I10 Essential (primary) hypertension: Secondary | ICD-10-CM

## 2012-12-24 DIAGNOSIS — N760 Acute vaginitis: Secondary | ICD-10-CM | POA: Insufficient documentation

## 2012-12-24 MED ORDER — LISINOPRIL-HYDROCHLOROTHIAZIDE 10-12.5 MG PO TABS: 1.0000 | ORAL_TABLET | Freq: Every day | ORAL | Status: DC

## 2012-12-24 NOTE — Progress Notes (Signed)
Patient ID: Ann [name], female   DOB: 1961/01/19, 52 y.o.   MRN: [id number]  CC: Follow-up  HPI: Ms. Ann [name] is here for followup and also to know that lab results. No specific complaint today except that her vaginal itching persists despite management with Diflucan. She denies any vaginal discharge, she has appointment with OB/GYN next week for Pap smear and evaluation of uterine fibroid.   No Known Allergies Past Medical History  Diagnosis Date  . [id number](478.2)    Current Outpatient Prescriptions on File Prior to Visit  Medication Sig Dispense Refill  . Cholecalciferol (VITAMIN D PO) Take 1 tablet by mouth once a week.      . Multiple Vitamin (MULTIVITAMIN) tablet Take 1 tablet by mouth daily.      Marland Kitchen acetaminophen (TYLENOL) 325 MG tablet Take 2 tablets (650 mg total) by mouth every 6 (six) hours as needed for pain.  30 tablet    . cyclobenzaprine (FLEXERIL) 5 MG tablet Take 1 tablet (5 mg total) by mouth 3 (three) times daily as needed for muscle spasms.  65 tablet  0  . fluconazole (DIFLUCAN) 100 MG tablet Take 1 tablet (100 mg total) by mouth daily.  5 tablet  0  . IBUPROFEN PO Take 500 mg by mouth as needed. Prn pain      . medroxyPROGESTERone (PROVERA) 10 MG tablet Take 2 tablets (20 mg total) by mouth daily.  30 tablet  2  . oxyCODONE-acetaminophen (PERCOCET) 5-325 mg TABS Take 6 tablets by mouth every 8 (eight) hours as needed.  45 tablet  0   No current facility-administered medications on file prior to visit.   Family History  Problem Relation Age of Onset  . Arthritis Mother   . Stroke Mother   . Hypertension Mother   . Arthritis Father   . Cancer Father     prostate  . Stroke Father   . Alcohol abuse Brother   . Stroke Brother   . Cancer Paternal Aunt     breast and uterine   History   Social History  . Marital Status: Single    Spouse Name: N/A    Number of Children: N/A  . Years of Education: N/A   Occupational History  . Not on file.   Social History  Main Topics  . Smoking status: Never Smoker   . Smokeless tobacco: Never Used  . Alcohol Use: Yes  . Drug Use: No  . Sexual Activity:    Other Topics Concern  . Not on file   Social History Narrative  . No narrative on file    Review of Systems: Constitutional: Negative for fever, chills, diaphoresis, activity change, appetite change and fatigue. HENT: Negative for ear pain, nosebleeds, congestion, facial swelling, rhinorrhea, neck pain, neck stiffness and ear discharge.  Eyes: Negative for pain, discharge, redness, itching and visual disturbance. Respiratory: Negative for cough, choking, chest tightness, shortness of breath, wheezing and stridor.  Cardiovascular: Negative for chest pain, palpitations and leg swelling. Gastrointestinal: Negative for abdominal distention. Genitourinary: Negative for dysuria, urgency, frequency, hematuria, flank pain, decreased urine volume, difficulty urinating and dyspareunia.  Musculoskeletal: Negative for back pain, joint swelling, arthralgias and gait problem. Neurological: Negative for dizziness, tremors, seizures, syncope, facial asymmetry, speech difficulty, weakness, light-headedness, numbness and headaches.  Hematological: Negative for adenopathy. Does not bruise/bleed easily. Psychiatric/Behavioral: Negative for hallucinations, behavioral problems, confusion, dysphoric mood, decreased concentration and agitation.    Objective:   Filed Vitals:   12/24/12 1539  BP: 125/84  Pulse: 87  Temp: 98.6 F (37 C)  Resp: 16    Physical Exam: Constitutional: Patient appears well-developed and well-nourished. No distress. HENT: Normocephalic, atraumatic, External right and left ear normal. Oropharynx is clear and moist.  Eyes: Conjunctivae and EOM are normal. PERRLA, no scleral icterus. Neck: Normal ROM. Neck supple. No JVD. No tracheal deviation. No thyromegaly. CVS: RRR, S1/S2 +, no murmurs, no gallops, no carotid bruit.  Pulmonary: Effort  and breath sounds normal, no stridor, rhonchi, wheezes, rales.  Abdominal: Soft. BS +,  no distension, tenderness, rebound or guarding.  Musculoskeletal: Normal range of motion. No edema and no tenderness.  Lymphadenopathy: No lymphadenopathy noted, cervical, inguinal or axillary Neuro: Alert. Normal reflexes, muscle tone coordination. No cranial nerve deficit. Skin: Skin is warm and dry. No rash noted. Not diaphoretic. No erythema. No pallor. Psychiatric: Normal mood and affect. Behavior, judgment, thought content normal.  Lab Results  Component Value Date   WBC 4.5 11/17/2012   HGB 13.0 11/17/2012   HCT 38.7 11/17/2012   MCV 85.6 11/17/2012   PLT 264 11/17/2012   Lab Results  Component Value Date   CREATININE 1.07 11/17/2012   BUN 18 11/17/2012   NA 136 11/17/2012   K 4.2 11/17/2012   CL 102 11/17/2012   CO2 28 11/17/2012    Lab Results  Component Value Date   HGBA1C 5.3 11/17/2012   Lipid Panel     Component Value Date/Time   CHOL 167 11/17/2012 1001   TRIG 80 11/17/2012 1001   HDL 64 11/17/2012 1001   CHOLHDL 2.6 11/17/2012 1001   VLDL 16 11/17/2012 1001   LDLCALC 87 11/17/2012 1001       Assessment and plan:   Patient Active Problem List   Diagnosis Date Noted  . Vaginitis and vulvovaginitis 12/24/2012  . Essential hypertension, benign 12/24/2012  . Hypertension 08/06/2012  . Neck muscle spasm 08/06/2012  . Shoulder pain 08/06/2012  . Vaginitis 08/18/2011  . Fibroids 08/07/2011  . Menorrhagia 08/07/2011  . Headache 07/03/2010  . Chest pain 07/03/2010  . Elevated BP 07/03/2010   Refill lisinopril-hydrochlorothiazide 10-2.5 mg tablet by mouth daily She has an appointment with OB/GYN next week for Pap smear and fibroid evaluation and she would like to be treated for her vaginitis at that time Lab results were reviewed with patient, mostly normal  Ann [name] was given clear instructions to go to ER or return to the clinic if symptoms don't improve, worsen or new problems develop.   Ann [name] verbalized understanding.  Ann [name] was told to call to get lab results if hasn't heard anything in the next week.        Jeanann Lewandowsky, MD [facility] [place], [place] [phone]   12/24/2012, 3:50 PM

## 2012-12-24 NOTE — Progress Notes (Signed)
PT HERE FOR BP MEDICATION REFILL. RAN OUT X 1 WEEK AGO. C/O SLIGHT H/A C/O RE OCCURING  UTI INFECTION. ITCH,BURN AND IRRITATION.PT HAS [facility]PT WITH [facility] NEXT THURSDAY

## 2012-12-27 ENCOUNTER — Ambulatory Visit: Payer: Medicaid Other

## 2012-12-29 ENCOUNTER — Ambulatory Visit (AMBULATORY_SURGERY_CENTER): Payer: Self-pay

## 2012-12-29 VITALS — Ht 62.0 in | Wt 216.4 lb

## 2012-12-29 DIAGNOSIS — Z1211 Encounter for screening for malignant neoplasm of colon: Secondary | ICD-10-CM

## 2012-12-29 MED ORDER — MOVIPREP 100 G PO SOLR: 1.0000 | Freq: Once | ORAL | Status: DC

## 2012-12-30 ENCOUNTER — Encounter: Payer: Medicaid Other | Admitting: Obstetrics & Gynecology

## 2013-01-13 ENCOUNTER — Encounter: Payer: Self-pay | Admitting: Internal Medicine

## 2013-02-09 ENCOUNTER — Encounter: Payer: Self-pay | Admitting: Obstetrics & Gynecology

## 2013-02-09 ENCOUNTER — Ambulatory Visit (INDEPENDENT_AMBULATORY_CARE_PROVIDER_SITE_OTHER): Payer: No Typology Code available for payment source | Admitting: Obstetrics & Gynecology

## 2013-02-09 VITALS — BP 121/85 | HR 82 | Temp 97.4°F | Ht 62.0 in | Wt 216.5 lb

## 2013-02-09 DIAGNOSIS — D259 Leiomyoma of uterus, unspecified: Secondary | ICD-10-CM

## 2013-02-09 DIAGNOSIS — Z23 Encounter for immunization: Secondary | ICD-10-CM

## 2013-02-09 MED ORDER — INFLUENZA VIRUS VACC SPLIT PF IM SUSP: 0.5000 mL | INTRAMUSCULAR | Status: AC

## 2013-02-09 NOTE — Patient Instructions (Signed)
Hysterectomy Information  A hysterectomy is a procedure where your uterus is surgically removed. It will no longer be possible to have menstrual periods or to become pregnant. The tubes and ovaries can be removed (bilateral salpingo-oopherectomy) during this surgery as well.  REASONS FOR A HYSTERECTOMY  Persistent, abnormal bleeding.  Lasting (chronic) pelvic pain or infection.  The lining of the uterus (endometrium) starts growing outside the uterus (endometriosis).  The endometrium starts growing in the muscle of the uterus (adenomyosis).  The uterus falls down into the vagina (pelvic organ prolapse).  Symptomatic uterine fibroids.  Precancerous cells.  Cervical cancer or uterine cancer. TYPES OF HYSTERECTOMIES  Supracervical hysterectomy. This type removes the top part of the uterus, but not the cervix.  Total hysterectomy. This type removes the uterus and cervix.  Radical hysterectomy. This type removes the uterus, cervix, and the fibrous tissue that holds the uterus in place in the pelvis (parametrium). WAYS A HYSTERECTOMY CAN BE PERFORMED  Abdominal hysterectomy. A large surgical cut (incision) is made in the abdomen. The uterus is removed through this incision.  Vaginal hysterectomy. An incision is made in the vagina. The uterus is removed through this incision. There are no abdominal incisions.  Conventional laparoscopic hysterectomy. A thin, lighted tube with a camera (laparoscope) is inserted into 3 or 4 small incisions in the abdomen. The uterus is cut into small pieces. The small pieces are removed through the incisions, or they are removed through the vagina.  Laparoscopic assisted vaginal hysterectomy (LAVH). Three or four small incisions are made in the abdomen. Part of the surgery is performed laparoscopically and part vaginally. The uterus is removed through the vagina.  Robot-assisted laparoscopic hysterectomy. A laparoscope is inserted into 3 or 4 small  incisions in the abdomen. A computer-controlled device is used to give the surgeon a 3D image. This allows for more precise movements of surgical instruments. The uterus is cut into small pieces and removed through the incisions or removed through the vagina. RISKS OF HYSTERECTOMY   Bleeding and risk of blood transfusion. Tell your caregiver if you do not want to receive any blood products.  Blood clots in the legs or lung.  Infection.  Injury to surrounding organs.  Anesthesia problems or side effects.  Conversion to an abdominal hysterectomy. WHAT TO EXPECT AFTER A HYSTERECTOMY  You will be given pain medicine.  You will need to have someone with you for the first 3 to 5 days after you go home.  You will need to follow up with your surgeon in 2 to 4 weeks after surgery to evaluate your progress.  You may have early menopause symptoms like hot flashes, night sweats, and insomnia.  If you had a hysterectomy for a problem that was not a cancer or a condition that could lead to cancer, then you no longer need Pap tests. However, even if you no longer need a Pap test, a regular exam is a good idea to make sure no other problems are starting. Document Released: 10/22/2000 Document Revised: 07/21/2011 Document Reviewed: 12/07/2010 [facility] Patient Information 2014 [place], [place]. Uterine Fibroid A uterine fibroid is a growth (tumor) that occurs in a woman's uterus. This type of tumor is not cancerous and does not spread out of the uterus. A woman can have one or many fibroids, and the fiboid(s) can become quite large. A fibroid can vary in size, weight, and where it grows in the uterus. Most fibroids do not require medical treatment, but some can cause pain  or heavy bleeding during and between periods. CAUSES  A fibroid is the result of a single uterine cell that keeps growing (unregulated), which is different than most cells in the human body. Most cells have a control mechanism that keeps  them from reproducing without control.  SYMPTOMS   Bleeding.  Pelvic pain and pressure.  Bladder problems due to the size of the fibroid.  Infertility and miscarriages depending on the size and location of the fibroid. DIAGNOSIS  A diagnosis is made by physical exam. Your caregiver may feel the lumpy tumors during a pelvic exam. Important information regarding size, location, and number of tumors can be gained by having an ultrasound. It is rare that other tests, such as a CT scan or MRI, are needed. TREATMENT   Your caregiver may recommend watchful waiting. This involves getting the fibroid checked by your caregiver to see if the fibroids grow or shrink.   Hormonal treatment or an intrauterine device (IUD) may be prescribed.   Surgery may be needed to remove the fibroids (myomectomy) or the uterus (hysterectomy). This depends on your situation. When fibroids interfere with fertility and a woman wants to become pregnant, a caregiver may recommend having the fibroids removed.  HOME CARE INSTRUCTIONS  Home care depends on how you were treated. In general:   Keep all follow-up appointments with your caregiver.   Only take medicine as told by your caregiver. Do not take aspirin. It can cause bleeding.   If you have excessive periods and soak tampons or pads in a half hour or less, contact your caregiver immediately. If your periods are troublesome but not so heavy, lie down with your feet raised slightly above your heart. Place cold packs on your lower abdomen.   If your periods are heavy, write down the number of pads or tampons you use per month. Bring this information to your caregiver.   Talk to your caregiver about taking iron pills.   Include green vegetables in your diet.   If you were prescribed a hormonal treatment, take the hormonal medicines as directed.   If you need surgery, ask your caregiver for information on your specific surgery.  SEEK IMMEDIATE MEDICAL  CARE IF:  You have pelvic pain or cramps not controlled with medicines.   You have a sudden increase in pelvic pain.   You have an increase of bleeding between and during periods.   You feel lightheaded or have fainting episodes.  MAKE SURE YOU:  Understand these instructions.  Will watch your condition.  Will get help right away if you are not doing well or get worse. Document Released: 04/25/2000 Document Revised: 07/21/2011 Document Reviewed: 05/19/2011 [facility] Patient Information 2014 [place], [place].

## 2013-02-09 NOTE — Addendum Note (Signed)
Addended by: Toula Moos on: 02/09/2013 04:44 PM   Modules accepted: Orders

## 2013-02-09 NOTE — Progress Notes (Signed)
Subjective:     Patient ID: Ann [name], female   DOB: June 22, 1960, 52 y.o.   MRN: [id number]  HPI Pt c/o pelvic pain and pressure and frequent voids.  She reports that she was evaluated and advised to have a hyst with Dr. Tresa Res in 2008 but, lost her job.  She is not having regular cycles but, she feels that her pressure is worse than previously.  Her last PAP was earlier this year.  Past Medical History  Diagnosis Date  . Headache(784.0)   . Hypertension    Past Surgical History  Procedure Laterality Date  . Colposcopy    . Foot surgery      right 5th toe-bone removed n1997   Current Outpatient Prescriptions on File Prior to Visit  Medication Sig Dispense Refill  . acetaminophen (TYLENOL) 325 MG tablet Take 2 tablets (650 mg total) by mouth every 6 (six) hours as needed for pain.  30 tablet    . IBUPROFEN PO Take 500 mg by mouth as needed. Prn pain      . lisinopril-hydrochlorothiazide (ZESTORETIC) 10-12.5 MG per tablet Take 1 tablet by mouth daily.  30 tablet  3  . Multiple Vitamin (MULTIVITAMIN) tablet Take 1 tablet by mouth daily.      . medroxyPROGESTERone (PROVERA) 10 MG tablet Take 2 tablets (20 mg total) by mouth daily.  30 tablet  2  . MOVIPREP 100 G SOLR Take 1 kit (200 g total) by mouth once.  1 kit  0   No current facility-administered medications on file prior to visit.   History   Social History  . Marital Status: Single    Spouse Name: N/A    Number of Children: N/A  . Years of Education: N/A   Occupational History  . Not on file.   Social History Main Topics  . Smoking status: Never Smoker   . Smokeless tobacco: Never Used  . Alcohol Use: Yes  . Drug Use: No  . Sexual Activity: Not on file   Other Topics Concern  . Not on file   Social History Narrative  . No narrative on file   Family History  Problem Relation Age of Onset  . Arthritis Mother   . Stroke Mother   . Hypertension Mother   . Arthritis Father   . Cancer Father     prostate  .  Stroke Father   . Alcohol abuse Brother   . Stroke Brother   . Cancer Paternal Aunt     breast and uterine  . Colon cancer Neg Hx        Review of Systems     Objective:   Physical Exam BP 121/85  Pulse 82  Temp(Src) 97.4 F (36.3 C) (Oral)  Ht 5\' 2"  (1.575 m)  Wt 216 lb 8 oz (98.204 kg)  BMI 39.59 kg/m2 Lungs: CTA CV: RRR Abd: obese, NT, ND. Mass palpable at umb GU: EGBUS: no lesions Vagina: no blood in vault Cervix: no lesion; no CMT Uterus: 20 weeks sized with wide base. Adnexa: difficult to assess due to mass effect of uterus        Assessment:     Uterine fibroids. symptomatic Pt wants surgical management.  D/w pt surgical options.  suspect too large for laparoscopic hyst especially due to wide base.     Plan:     Patient desires surgical management with TAH with bilateral salpingectomy.  The risks of surgery were discussed in detail with the patient including but  not limited to: bleeding which may require transfusion or reoperation; infection which may require prolonged hospitalization or re-hospitalization and antibiotic therapy; injury to bowel, bladder, ureters and major vessels or other surrounding organs; need for additional procedures including laparotomy; thromboembolic phenomenon, incisional problems and other postoperative or anesthesia complications.  Patient was told that the likelihood that her condition and symptoms will be treated effectively with this surgical management was very high; the postoperative expectations were also discussed in detail. The patient also understands the alternative treatment options which were discussed in full. All questions were answered.  She was told that she will be contacted by our surgical scheduler regarding the time and date of her surgery; routine preoperative instructions of having nothing to eat or drink after midnight on the day prior to surgery and also coming to the hospital 1 1/2 hours prior to her time of  surgery were also emphasized.  She was told she may be called for a preoperative appointment about a week prior to surgery and will be given further preoperative instructions at that visit. Printed patient education handouts about the procedure were given to the patient to review at home.

## 2013-02-15 ENCOUNTER — Ambulatory Visit (HOSPITAL_COMMUNITY)
Admission: RE | Admit: 2013-02-15 | Discharge: 2013-02-15 | Disposition: A | Discharge status: Home / Self Care | Payer: No Typology Code available for payment source | Source: Ambulatory Visit | Attending: Obstetrics & Gynecology | Admitting: Obstetrics & Gynecology

## 2013-02-15 DIAGNOSIS — D259 Leiomyoma of uterus, unspecified: Secondary | ICD-10-CM | POA: Insufficient documentation

## 2013-02-15 DIAGNOSIS — N949 Unspecified condition associated with female genital organs and menstrual cycle: Secondary | ICD-10-CM | POA: Insufficient documentation

## 2013-02-17 ENCOUNTER — Encounter (HOSPITAL_COMMUNITY): Payer: Self-pay | Admitting: Emergency Medicine

## 2013-02-17 ENCOUNTER — Emergency Department (INDEPENDENT_AMBULATORY_CARE_PROVIDER_SITE_OTHER)
Admission: EM | Admit: 2013-02-17 | Discharge: 2013-02-17 | Disposition: A | Discharge status: Nursing Home (Includes ICF) | Payer: No Typology Code available for payment source | Source: Home / Self Care | Attending: Family Medicine | Admitting: Family Medicine

## 2013-02-17 ENCOUNTER — Emergency Department (HOSPITAL_COMMUNITY)
Admission: EM | Admit: 2013-02-17 | Discharge: 2013-02-17 | Disposition: A | Discharge status: Home / Self Care | Payer: No Typology Code available for payment source | Attending: Emergency Medicine | Admitting: Emergency Medicine

## 2013-02-17 ENCOUNTER — Ambulatory Visit: Payer: Medicaid Other | Admitting: Internal Medicine

## 2013-02-17 DIAGNOSIS — R002 Palpitations: Secondary | ICD-10-CM | POA: Insufficient documentation

## 2013-02-17 DIAGNOSIS — R0789 Other chest pain: Secondary | ICD-10-CM | POA: Diagnosis present

## 2013-02-17 DIAGNOSIS — R079 Chest pain, unspecified: Secondary | ICD-10-CM

## 2013-02-17 DIAGNOSIS — R0602 Shortness of breath: Secondary | ICD-10-CM | POA: Insufficient documentation

## 2013-02-17 DIAGNOSIS — I493 Ventricular premature depolarization: Secondary | ICD-10-CM

## 2013-02-17 DIAGNOSIS — R0601 Orthopnea: Secondary | ICD-10-CM

## 2013-02-17 DIAGNOSIS — I4949 Other premature depolarization: Secondary | ICD-10-CM

## 2013-02-17 DIAGNOSIS — Z79899 Other long term (current) drug therapy: Secondary | ICD-10-CM | POA: Insufficient documentation

## 2013-02-17 DIAGNOSIS — I10 Essential (primary) hypertension: Secondary | ICD-10-CM | POA: Insufficient documentation

## 2013-02-17 LAB — COMPREHENSIVE METABOLIC PANEL
ALT: 7 U/L (ref 0–35)
AST: 10 U/L (ref 0–37)
Alkaline Phosphatase: 32 U/L — ABNORMAL LOW (ref 39–117)
CO2: 16 mEq/L — ABNORMAL LOW (ref 19–32)
Calcium: 5.2 mg/dL — CL (ref 8.4–10.5)
Chloride: 120 mEq/L — ABNORMAL HIGH (ref 96–112)
GFR calc Af Amer: >90 mL/min (ref >90)
GFR calc non Af Amer: >90 mL/min (ref >90)
Glucose, Bld: 69 mg/dL — ABNORMAL LOW (ref 70–99)
Potassium: 2.3 mEq/L — CL (ref 3.5–5.1)
Sodium: 146 mEq/L — ABNORMAL HIGH (ref 135–145)
Total Bilirubin: 0.2 mg/dL — ABNORMAL LOW (ref 0.3–1.2)

## 2013-02-17 LAB — CBC WITH DIFFERENTIAL/PLATELET
Hemoglobin: 14.6 g/dL (ref 12.0–15.0)
Lymphocytes Relative: 31 % (ref 12–46)
Lymphs Abs: 1.9 10*3/uL (ref 0.7–4.0)
MCV: 85.3 fL (ref 78.0–100.0)
Monocytes Relative: 5 % (ref 3–12)
Neutro Abs: 3.8 10*3/uL (ref 1.7–7.7)
Neutrophils Relative %: 64 % (ref 43–77)
Platelets: 202 10*3/uL (ref 150–400)
RBC: 4.96 MIL/uL (ref 3.87–5.11)
WBC: 6 10*3/uL (ref 4.0–10.5)

## 2013-02-17 LAB — CBC
Hemoglobin: 9.8 g/dL — ABNORMAL LOW (ref 12.0–15.0)
MCH: 28.6 pg (ref 26.0–34.0)
Platelets: 147 10*3/uL — ABNORMAL LOW (ref 150–400)
RBC: 3.43 MIL/uL — ABNORMAL LOW (ref 3.87–5.11)
WBC: 4 10*3/uL (ref 4.0–10.5)

## 2013-02-17 LAB — POCT I-STAT TROPONIN I: Troponin i, poc: 0 ng/mL (ref 0.00–0.08)

## 2013-02-17 LAB — PROTIME-INR: INR: 1.21 (ref 0.00–1.49)

## 2013-02-17 LAB — BASIC METABOLIC PANEL
CO2: 25 mEq/L (ref 19–32)
Calcium: 9.4 mg/dL (ref 8.4–10.5)
Creatinine, Ser: 0.88 mg/dL (ref 0.50–1.10)
GFR calc non Af Amer: 75 mL/min — ABNORMAL LOW (ref >90)
Glucose, Bld: 87 mg/dL (ref 70–99)
Potassium: 3.7 mEq/L (ref 3.5–5.1)
Sodium: 141 mEq/L (ref 135–145)

## 2013-02-17 LAB — APTT: aPTT: 38 seconds — ABNORMAL HIGH (ref 24–37)

## 2013-02-17 MED ORDER — ALPRAZOLAM 0.25 MG PO TABS: 0.2500 mg | ORAL_TABLET | Freq: Three times a day (TID) | ORAL | Status: DC | PRN

## 2013-02-17 MED ORDER — NITROGLYCERIN 0.4 MG SL SUBL
SUBLINGUAL_TABLET | SUBLINGUAL | Status: AC
  Filled 2013-02-17: qty 25

## 2013-02-17 MED ORDER — ASPIRIN 81 MG PO CHEW
324.0000 mg | CHEWABLE_TABLET | Freq: Once | ORAL | Status: AC
  Administered 2013-02-17: 324 mg via ORAL

## 2013-02-17 MED ORDER — NITROGLYCERIN 0.4 MG SL SUBL
0.4000 mg | SUBLINGUAL_TABLET | SUBLINGUAL | Status: DC | PRN
  Administered 2013-02-17: 0.4 mg via SUBLINGUAL

## 2013-02-17 MED ORDER — RANITIDINE HCL 150 MG PO CAPS: 150.0000 mg | ORAL_CAPSULE | Freq: Every day | ORAL | Status: DC

## 2013-02-17 MED ORDER — ASPIRIN 81 MG PO CHEW
CHEWABLE_TABLET | ORAL | Status: AC
  Filled 2013-02-17: qty 4

## 2013-02-17 MED ORDER — SODIUM CHLORIDE 0.9 % IV SOLN
Freq: Once | INTRAVENOUS | Status: AC
  Administered 2013-02-17: 12:00:00 via INTRAVENOUS

## 2013-02-17 MED ORDER — ASPIRIN 81 MG PO CHEW: 324.0000 mg | CHEWABLE_TABLET | Freq: Once | ORAL | Status: DC

## 2013-02-17 MED ORDER — SODIUM CHLORIDE 0.9 % IV SOLN
1000.0000 mL | INTRAVENOUS | Status: DC
  Administered 2013-02-17: 1000 mL via INTRAVENOUS

## 2013-02-17 NOTE — ED Provider Notes (Signed)
CSN: [id number]     Arrival date & time 02/17/13  1247 History  First MD Initiated Contact with Patient 02/17/13 1248     Chief Complaint  Patient presents with  . Chest Pain   HPI Patient presents to the emergency room for further evaluation after being seen in urgent care today. Patient reports having episodes of chest pain and some shortness of breath associated with palpitations. She is has been having episodes off and on for at least for several weeks. She feels that her symptoms have increased over the last several days. Patient states that she has intermittent episodes lasting just a few minutes of tightness and heaviness. Patient states the symptoms will often be followed up burping and belching. She feels short of breath during these episodes but her dyspnea resolves when the episodes resolve. She denies any diaphoresis. Symptoms from the substernal part of her chest and her epigastric region. The pain does not radiate. She denies any weakness, numbness, diaphoresis or syncope. She does not have any nausea vomiting or abdominal pain. The patient does have a history of hypertension but denies any history of heart disease. There is no history of heart disease. She does not smoke. She has not noticed any leg swelling and does not have any history of DVT or PE  Patient was seen in urgent care Center today and was sent to return for further valuation for Past Medical History  Diagnosis Date  . Headache(784.0)   . Hypertension    Past Surgical History  Procedure Laterality Date  . Colposcopy    . Foot surgery      right 5th toe-bone removed n1997   Family History  Problem Relation Age of Onset  . Arthritis Mother   . Stroke Mother   . Hypertension Mother   . Arthritis Father   . Cancer Father     prostate  . Stroke Father   . Alcohol abuse Brother   . Stroke Brother   . Cancer Paternal Aunt     breast and uterine  . Colon cancer Neg Hx    History  Substance Use Topics  .  Smoking status: Never Smoker   . Smokeless tobacco: Never Used  . Alcohol Use: Yes   OB History   Grav Para Term Preterm Abortions TAB SAB Ect Mult Living   4 3 2 1 1 1  0 0 0 2     Review of Systems  All other systems reviewed and are negative.    Allergies  Review of patient's allergies indicates no known allergies.  Home Medications   Current Outpatient Rx  Name  Route  Sig  Dispense  Refill  . acetaminophen (TYLENOL) 500 MG tablet   Oral   Take 1,000 mg by mouth every 6 (six) hours as needed (for headache).         Marland Kitchen ibuprofen (ADVIL,MOTRIN) 200 MG tablet   Oral   Take 400 mg by mouth every 8 (eight) hours as needed for headache.          . lisinopril-hydrochlorothiazide (ZESTORETIC) 10-12.5 MG per tablet   Oral   Take 1 tablet by mouth daily.   30 tablet   3   . OVER THE COUNTER MEDICATION   Oral   Take by mouth daily. Apple cider vinegar and honey in hot water.          BP 113/64  Pulse 73  Temp(Src) 99.1 F (37.3 C) (Oral)  Resp 15  Ht 5'  2" (1.575 m)  Wt 216 lb (97.977 kg)  BMI 39.5 kg/m2  SpO2 100% Physical Exam  Nursing note and vitals reviewed. Constitutional: She appears well-developed and well-nourished. No distress.  HENT:  Head: Normocephalic and atraumatic.  Right Ear: External ear normal.  Left Ear: External ear normal.  Eyes: Conjunctivae are normal. Right eye exhibits no discharge. Left eye exhibits no discharge. No scleral icterus.  Neck: Neck supple. No tracheal deviation present.  Cardiovascular: Normal rate, regular rhythm and intact distal pulses.   Pulmonary/Chest: Effort normal and breath sounds normal. No stridor. No respiratory distress. She has no wheezes. She has no rales.  Abdominal: Soft. Bowel sounds are normal. She exhibits no distension. There is no tenderness. There is no rebound and no guarding.  Musculoskeletal: She exhibits no edema and no tenderness.  Neurological: She is alert. She has normal strength. No  sensory deficit. Cranial nerve deficit:  no gross defecits noted. She exhibits normal muscle tone. She displays no seizure activity. Coordination normal.  Skin: Skin is warm and dry. No rash noted.  Psychiatric: She has a normal mood and affect.    ED Course  Procedures (including critical care time) Labs Review Labs Reviewed  CBC - Abnormal; Notable for the following:    RBC 3.43 (*)    Hemoglobin 9.8 (*)    HCT 29.1 (*)    Platelets 147 (*)    All other components within normal limits  COMPREHENSIVE METABOLIC PANEL - Abnormal; Notable for the following:    Sodium 146 (*)    Potassium 2.3 (*)    Chloride 120 (*)    CO2 16 (*)    Glucose, Bld 69 (*)    Calcium 5.2 (*)    Total Protein 4.5 (*)    Albumin 2.1 (*)    Alkaline Phosphatase 32 (*)    Total Bilirubin 0.2 (*)    All other components within normal limits  APTT - Abnormal; Notable for the following:    aPTT 38 (*)    All other components within normal limits  PROTIME-INR  LIPASE, BLOOD  BASIC METABOLIC PANEL  POCT I-STAT TROPONIN I   Imaging Review No results found.  EKG Interpretation     Ventricular Rate:  100 PR Interval:  154 QRS Duration: 92 QT Interval:  341 QTC Calculation: 440 R Axis:   35 Text Interpretation:  Sinus tachycardia Multiple ventricular premature complexes Consider right atrial enlargement           EKG compared to EKG performed 07/02/2010 no significant changes other than the PVCs MDM  Pt presents with chest pain that sounds atypical in nature.  Initial troponin negative.  Will check second set.  Electrolytes are significantly abnormal which does not fit clinical scenario. Plan on repeat bmet and troponin.  If negative, pt is low risk and symptoms are not concerning for ACS I will start her on a PPI and follow up with pcp as outpatient  Celene Kras, MD 02/17/13 1627

## 2013-02-17 NOTE — ED Provider Notes (Signed)
Ann [name] is a 52 y.o. female who presents to Urgent Care today for central chest pain palpitations or orthopnea. Patient has noted intermittent episodes of chest tightness and heaviness over the past few weeks. Over the past few days her symptoms become more frequent. This morning she noticed central chest heaviness and tightness described as a weight pressing on her chest. The symptoms have persisted all morning and not been alleviated. She additionally notes an inability to lay flat and she becomes short of breath. Additionally she notes palpitations. She denies significant chest pain or radiating pain weakness or numbness. She denies any significant trouble breathing currently. She denies any nausea vomiting or diarrhea. She has not tried any medications for this issue yet. Her past medical history significant for hypertension for which she takes lisinopril.   Past Medical History  Diagnosis Date  . Headache(784.0)   . Hypertension    History  Substance Use Topics  . Smoking status: Never Smoker   . Smokeless tobacco: Never Used  . Alcohol Use: Yes   ROS as above Medications reviewed. Current Facility-Administered Medications  Medication Dose Route Frequency Provider Last Rate Last Dose  . 0.9 %  sodium chloride infusion   Intravenous Once Rodolph Bong, MD      . aspirin chewable tablet 324 mg  324 mg Oral Once Rodolph Bong, MD      . nitroGLYCERIN (NITROSTAT) SL tablet 0.4 mg  0.4 mg Sublingual Q5 min PRN Rodolph Bong, MD       Current Outpatient Prescriptions  Medication Sig Dispense Refill  . lisinopril-hydrochlorothiazide (ZESTORETIC) 10-12.5 MG per tablet Take 1 tablet by mouth daily.  30 tablet  3  . acetaminophen (TYLENOL) 325 MG tablet Take 2 tablets (650 mg total) by mouth every 6 (six) hours as needed for pain.  30 tablet    . IBUPROFEN PO Take 500 mg by mouth as needed. Prn pain      . medroxyPROGESTERone (PROVERA) 10 MG tablet Take 2 tablets (20 mg total) by mouth  daily.  30 tablet  2  . MOVIPREP 100 G SOLR Take 1 kit (200 g total) by mouth once.  1 kit  0  . Multiple Vitamin (MULTIVITAMIN) tablet Take 1 tablet by mouth daily.        Exam:  BP 156/89  Pulse 83  Temp(Src) 99.3 F (37.4 C) (Oral)  Resp 18  SpO2 100% Gen: Well NAD HEENT: EOMI,  MMM Lungs: CTABL Nl WOB Heart: RRR no MRG Abd: NABS, NT, ND Exts: Non edematous BL  LE, warm and well perfused.   No results found for this or any previous visit (from the past 24 hour(s)). No results found.  Twelve-lead EKG shows sinus rhythm at 108 beats per minute with frequent PVCs (more than 10 in 60 seconds). Additionally she has inverted T waves in V1 V2 V3 which is new. She does not have any Q waves or ST segment elevation or depression  Assessment and Plan: 52 y.o. female with chest pain palpitations or orthopnea. This is concerning for acute coronary syndrome. Plan to give nitroglycerin, aspirin, oxygen. Additionally we'll start IV and transferred to the emergency room via EMS for further evaluation and management.      Rodolph Bong, MD 02/17/13 [phone]

## 2013-02-17 NOTE — ED Notes (Signed)
Dr. Roselyn Bering at the bedside.

## 2013-02-17 NOTE — ED Notes (Signed)
Per [name], RN... Nitrostat 0.4mg  given to Ann [name], EMT for [facility]... Pt c/o cest pain to him 9/10

## 2013-02-17 NOTE — ED Provider Notes (Signed)
4:25 PM Accepted care from Dr. Lynelle Doctor. 52F here w/ atypical cp. Labs appear abn w/ electrolyte abnormalities, but suspect lab error as pt appears well otherwise. Plan to get delta trop and repeat labwork.   5:52 PM: I interpreted/reviewed the labs and/or imaging which were non-contributory. Delta trop neg, pt continues to appear well. Will start zantac and give pt a few xanax as she thinks anxiety may be a component.  I have discussed the diagnosis/risks/treatment options with the patient and believe the pt to be eligible for discharge home to follow-up with pcp as scheduled next wednesday. We also discussed returning to the ED immediately if new or worsening sx occur. We discussed the sx which are most concerning (e.g., worsening cp, sob, fever) that necessitate immediate return. Any new prescriptions provided to the patient are listed below.  New Prescriptions   ALPRAZOLAM (XANAX) 0.25 MG TABLET    Take 1 tablet (0.25 mg total) by mouth 3 (three) times daily as needed for anxiety.   RANITIDINE (ZANTAC) 150 MG CAPSULE    Take 1 capsule (150 mg total) by mouth daily.   Clinical Impression 1. Atypical chest pain      Junius Argyle, MD 02/17/13 [phone]

## 2013-02-17 NOTE — ED Notes (Signed)
Dr. Lynelle Doctor back in to speak with patient

## 2013-02-17 NOTE — ED Notes (Signed)
Report given to [facility]  

## 2013-02-17 NOTE — ED Notes (Signed)
Pt c/o chest discomfort onset 4 days Reports this morning the pain was worse... Describes pain as "heavy" w/palpitations  At the moment, has no pain... Pain radiated to the back w/sharp pains and to left shoulder and also to epigastric/abd area Denies: HA, SOB, blurry vision, edema, weakness She is alert and oriented w/no signs of acute distress.... Husband is in the room w/pt.

## 2013-02-17 NOTE — ED Notes (Signed)
Lorelle Gibbs from the lab called to report new Hemoglobin that was significantly higher than previous result. [facility] RN notified.

## 2013-02-17 NOTE — ED Notes (Signed)
[facility] Tx for on/off CP. ASA 324mg  given, Nitro x1. CP free at this time. Multiple PVC, occasional bigeminy, trigeminy. NO SOB.

## 2013-02-17 NOTE — ED Notes (Signed)
Placed pt on monitor and on 2 litters of O2 per Dr. Denyse Amass.... Pt is stable

## 2013-02-18 ENCOUNTER — Ambulatory Visit: Payer: No Typology Code available for payment source | Attending: Internal Medicine | Admitting: Internal Medicine

## 2013-02-18 ENCOUNTER — Ambulatory Visit
Admission: RE | Admit: 2013-02-18 | Discharge: 2013-02-18 | Disposition: A | Discharge status: Home / Self Care | Payer: Medicaid Other | Source: Ambulatory Visit | Attending: Internal Medicine | Admitting: Internal Medicine

## 2013-02-18 VITALS — BP 119/81 | HR 86 | Temp 98.1°F | Resp 16 | Wt 215.6 lb

## 2013-02-18 DIAGNOSIS — I10 Essential (primary) hypertension: Secondary | ICD-10-CM

## 2013-02-18 DIAGNOSIS — R0789 Other chest pain: Secondary | ICD-10-CM | POA: Insufficient documentation

## 2013-02-18 LAB — COMPREHENSIVE METABOLIC PANEL
ALT: 13 U/L (ref 0–35)
AST: 19 U/L (ref 0–37)
Albumin: 4.1 g/dL (ref 3.5–5.2)
CO2: 28 mEq/L (ref 19–32)
Calcium: 9.8 mg/dL (ref 8.4–10.5)
Chloride: 103 mEq/L (ref 96–112)
Potassium: 4.2 mEq/L (ref 3.5–5.3)
Sodium: 137 mEq/L (ref 135–145)
Total Protein: 7.6 g/dL (ref 6.0–8.3)

## 2013-02-18 MED ORDER — LORAZEPAM 0.5 MG PO TABS: 0.5000 mg | ORAL_TABLET | Freq: Two times a day (BID) | ORAL | Status: DC | PRN

## 2013-02-18 NOTE — Patient Instructions (Signed)
Chest Pain (Nonspecific) °It is often hard to give a specific diagnosis for the cause of chest pain. There is always a chance that your pain could be related to something serious, such as a heart attack or a blood clot in the lungs. You need to follow up with your caregiver for further evaluation. °CAUSES  °· Heartburn. °· Pneumonia or bronchitis. °· Anxiety or stress. °· Inflammation around your heart (pericarditis) or lung (pleuritis or pleurisy). °· A blood clot in the lung. °· A collapsed lung (pneumothorax). It can develop suddenly on its own (spontaneous pneumothorax) or from injury (trauma) to the chest. °· Shingles infection (herpes zoster virus). °The chest wall is composed of bones, muscles, and cartilage. Any of these can be the source of the pain. °· The bones can be bruised by injury. °· The muscles or cartilage can be strained by coughing or overwork. °· The cartilage can be affected by inflammation and become sore (costochondritis). °DIAGNOSIS  °Lab tests or other studies, such as X-rays, electrocardiography, stress testing, or cardiac imaging, may be needed to find the cause of your pain.  °TREATMENT  °· Treatment depends on what may be causing your chest pain. Treatment may include: °· Acid blockers for heartburn. °· Anti-inflammatory medicine. °· Pain medicine for inflammatory conditions. °· Antibiotics if an infection is present. °· You may be advised to change lifestyle habits. This includes stopping smoking and avoiding alcohol, caffeine, and chocolate. °· You may be advised to keep your head raised (elevated) when sleeping. This reduces the chance of acid going backward from your stomach into your esophagus. °· Most of the time, nonspecific chest pain will improve within 2 to 3 days with rest and mild pain medicine. °HOME CARE INSTRUCTIONS  °· If antibiotics were prescribed, take your antibiotics as directed. Finish them even if you start to feel better. °· For the next few days, avoid physical  activities that bring on chest pain. Continue physical activities as directed. °· Do not smoke. °· Avoid drinking alcohol. °· Only take over-the-counter or prescription medicine for pain, discomfort, or fever as directed by your caregiver. °· Follow your caregiver's suggestions for further testing if your chest pain does not go away. °· Keep any follow-up appointments you made. If you do not go to an appointment, you could develop lasting (chronic) problems with pain. If there is any problem keeping an appointment, you must call to reschedule. °SEEK MEDICAL CARE IF:  °· You think you are having problems from the medicine you are taking. Read your medicine instructions carefully. °· Your chest pain does not go away, even after treatment. °· You develop a rash with blisters on your chest. °SEEK IMMEDIATE MEDICAL CARE IF:  °· You have increased chest pain or pain that spreads to your arm, neck, jaw, back, or abdomen. °· You develop shortness of breath, an increasing cough, or you are coughing up blood. °· You have severe back or abdominal pain, feel nauseous, or vomit. °· You develop severe weakness, fainting, or chills. °· You have a fever. °THIS IS AN EMERGENCY. Do not wait to see if the pain will go away. Get medical help at once. Call your local emergency services (911 in [place] Do not drive yourself to the hospital. °MAKE SURE YOU:  °· Understand these instructions. °· Will watch your condition. °· Will get help right away if you are not doing well or get worse. °Document Released: 02/05/2005 Document Revised: 07/21/2011 Document Reviewed: 12/02/2007 °ExitCare® Patient Information ©2014 [employer]   [employer]. ° °

## 2013-02-18 NOTE — Progress Notes (Signed)
Patient was seen yesterday at [facility] for racing heart Was transferred to the ED States she was diagnosed with anxiety Feels much better today

## 2013-02-18 NOTE — Progress Notes (Signed)
Patient ID: Ann [name], female   DOB: October 16, 1960, 52 y.o.   MRN: [id number]  CC: follow up  HPI: Pt is 52 yo female with HTN who presents to clinic for follow up. She explains she was in ED yesterday for evaluation of persistent substernal chest pain associated with palpitations and shortness of breath. She explains this started several days prior to this visit and it concerned her and for that reason she went to emergency department. She says the pain is sharp occasionally throbbing, 5/10 in severity and when present it radiates to neck and bilateral arms. She denies similar episodes in the past. She denies any specific alleviating or aggravating factors. In emergency department blood work was done and she tells me she was told that her heart was fine and the symptoms were most likely related to acid reflux. She was sent home with followup with primary care physician.  No Known Allergies  Past Medical History  Diagnosis Date  . Headache(784.0)   . Hypertension    Current Outpatient Prescriptions on File Prior to Visit  Medication Sig Dispense Refill  . acetaminophen (TYLENOL) 500 MG tablet Take 1,000 mg by mouth every 6 (six) hours as needed (for headache).      Marland Kitchen ibuprofen (ADVIL,MOTRIN) 200 MG tablet Take 400 mg by mouth every 8 (eight) hours as needed for headache.       . lisinopril-hydrochlorothiazide (ZESTORETIC) 10-12.5 MG per tablet Take 1 tablet by mouth daily.  30 tablet  3  . OVER THE COUNTER MEDICATION Take by mouth daily. Apple cider vinegar and honey in hot water.      . ranitidine (ZANTAC) 150 MG capsule Take 1 capsule (150 mg total) by mouth daily.  30 capsule  1   No current facility-administered medications on file prior to visit.   Family History  Problem Relation Age of Onset  . Arthritis Mother   . Stroke Mother   . Hypertension Mother   . Arthritis Father   . Cancer Father     prostate  . Stroke Father   . Alcohol abuse Brother   . Stroke Brother   . Cancer  Paternal Aunt     breast and uterine  . Colon cancer Neg Hx    History   Social History  . Marital Status: Single    Spouse Name: N/A    Number of Children: N/A  . Years of Education: N/A   Occupational History  . Not on file.   Social History Main Topics  . Smoking status: Never Smoker   . Smokeless tobacco: Never Used  . Alcohol Use: Yes  . Drug Use: No  . Sexual Activity: Not on file   Other Topics Concern  . Not on file   Social History Narrative  . No narrative on file    Review of Systems  Constitutional: Negative for fever, chills, diaphoresis, activity change, appetite change and fatigue.  HENT: Negative for ear pain, nosebleeds, congestion, facial swelling, rhinorrhea, neck pain, neck stiffness and ear discharge.   Eyes: Negative for pain, discharge, redness, itching and visual disturbance.  Respiratory: Negative for cough, choking, chest tightness, shortness of breath, wheezing and stridor.   Cardiovascular: Per history of present illness.  Gastrointestinal: Negative for abdominal distention.  Genitourinary: Negative for dysuria, urgency, frequency, hematuria, flank pain, decreased urine volume, difficulty urinating and dyspareunia.  Musculoskeletal: Negative for back pain, joint swelling, arthralgias and gait problem.  Neurological: Negative for dizziness, tremors, seizures, syncope, facial asymmetry, speech  difficulty, weakness, light-headedness, numbness and headaches.  Hematological: Negative for adenopathy. Does not bruise/bleed easily.  Psychiatric/Behavioral: Negative for hallucinations, behavioral problems, confusion, dysphoric mood, decreased concentration and agitation.    Objective:   Filed Vitals:   02/18/13 1134  BP: 119/81  Pulse: 86  Temp: 98.1 F (36.7 C)  Resp: 16    Physical Exam  Constitutional: Appears well-developed and well-nourished. No distress.  HENT: Normocephalic. External right and left ear normal. Oropharynx is clear and  moist.  Eyes: Conjunctivae and EOM are normal. PERRLA, no scleral icterus.  Neck: Normal ROM. Neck supple. No JVD. No tracheal deviation. No thyromegaly.  CVS: RRR, S1/S2 +, no murmurs, no gallops, no carotid bruit.  Pulmonary: Effort and breath sounds normal, no stridor, rhonchi, wheezes, rales.  Abdominal: Soft. BS +,  no distension, tenderness, rebound or guarding.  Musculoskeletal: Normal range of motion. No edema and no tenderness.  Lymphadenopathy: No lymphadenopathy noted, cervical, inguinal. Neuro: Alert. Normal reflexes, muscle tone coordination. No cranial nerve deficit. Skin: Skin is warm and dry. No rash noted. Not diaphoretic. No erythema. No pallor.  Psychiatric: Normal mood and affect. Behavior, judgment, thought content normal.   Lab Results  Component Value Date   WBC 6.0 02/17/2013   HGB 14.6 02/17/2013   HCT 42.3 02/17/2013   MCV 85.3 02/17/2013   PLT 202 02/17/2013   Lab Results  Component Value Date   CREATININE 0.88 02/17/2013   BUN 11 02/17/2013   NA 141 02/17/2013   K 3.7 02/17/2013   CL 104 02/17/2013   CO2 25 02/17/2013    Lab Results  Component Value Date   HGBA1C 5.3 11/17/2012   Lipid Panel     Component Value Date/Time   CHOL 167 11/17/2012 1001   TRIG 80 11/17/2012 1001   HDL 64 11/17/2012 1001   CHOLHDL 2.6 11/17/2012 1001   VLDL 16 11/17/2012 1001   LDLCALC 87 11/17/2012 1001       Assessment and plan:   Patient Active Problem List   Diagnosis Date Noted  . Atypical chest pain  -  atypical in nature however I'm not entirely able to rule out cardiac etiology. Initial blood work in emergency department indicated severe hypokalemia with potassium of 2.3 and calcium of 5.6. Repeat blood work within normal limits. I am not sure which blood test is the correct one and will therefore have to repeat these blood tests today to ensure that electrolytes are stable and within normal limits. I will also check TSH to ensure the thyroid gland functioning is stable. I will  send patient for chest x-ray as I do not have one available from emergency department. Patient reports it was not done.  02/17/2013

## 2013-02-19 LAB — TROPONIN I: Troponin I: <0.01 ng/mL (ref <0.06)

## 2013-02-23 ENCOUNTER — Ambulatory Visit: Payer: Medicaid Other

## 2013-03-01 ENCOUNTER — Encounter (HOSPITAL_COMMUNITY): Payer: Self-pay | Admitting: Pharmacist

## 2013-03-03 ENCOUNTER — Encounter (HOSPITAL_COMMUNITY): Payer: Self-pay

## 2013-03-03 ENCOUNTER — Encounter (HOSPITAL_COMMUNITY)
Admission: RE | Admit: 2013-03-03 | Discharge: 2013-03-03 | Disposition: A | Discharge status: Home / Self Care | Payer: Medicaid Other | Source: Ambulatory Visit | Attending: Obstetrics & Gynecology | Admitting: Obstetrics & Gynecology

## 2013-03-03 DIAGNOSIS — Z01812 Encounter for preprocedural laboratory examination: Secondary | ICD-10-CM | POA: Insufficient documentation

## 2013-03-03 HISTORY — DX: Cardiac arrhythmia, unspecified: I49.9

## 2013-03-03 HISTORY — DX: Anxiety disorder, unspecified: F41.9

## 2013-03-03 HISTORY — DX: Gastro-esophageal reflux disease without esophagitis: K21.9

## 2013-03-03 HISTORY — DX: Anemia, unspecified: D64.9

## 2013-03-03 LAB — BASIC METABOLIC PANEL
CO2: 28 mEq/L (ref 19–32)
Calcium: 9.5 mg/dL (ref 8.4–10.5)
Creatinine, Ser: 0.95 mg/dL (ref 0.50–1.10)
GFR calc non Af Amer: 68 mL/min — ABNORMAL LOW (ref >90)
Potassium: 3.9 mEq/L (ref 3.5–5.1)

## 2013-03-03 LAB — CBC
Hemoglobin: 13.9 g/dL (ref 12.0–15.0)
MCH: 28.1 pg (ref 26.0–34.0)
MCHC: 33.2 g/dL (ref 30.0–36.0)
MCV: 84.8 fL (ref 78.0–100.0)
Platelets: 223 10*3/uL (ref 150–400)
RBC: 4.94 MIL/uL (ref 3.87–5.11)
RDW: 15.4 % (ref 11.5–15.5)

## 2013-03-03 NOTE — Patient Instructions (Signed)
Your procedure is scheduled on: 03/23/2013  Enter through the Main Entrance of [facility] at: 0700am  Pick up the phone at the desk and dial [phone].  Call this number if you have problems the morning of surgery: [phone].  Remember: Do NOT eat food: AFTER MIDNIGHT 03/22/2013 Do NOT drink clear liquids after: AFTER MIDNIGHT 03/22/2013 Take these medicines the morning of surgery with a SIP OF WATER: LISINOPRIL-HYDROCHLOROTHIAZIDE, ZANTAC, ATIVAN IF NEEDED  *REFER TO YOUR SURGEON ABOUT TAKING ASA AND IBUPROFEN PRIOR TO SURGERY  Do NOT wear jewelry (body piercing), make-up, or nail polish. Do NOT wear lotions, powders, or perfumes.  You may wear deoderant. Do NOT shave for 48 hours prior to surgery. Do NOT bring valuables to the hospital. Contacts, dentures, or bridgework may not be worn into surgery. Leave suitcase in car.  After surgery it may be brought to your room.  For patients admitted to the hospital, checkout time is 11:00 AM the day of discharge.

## 2013-03-09 ENCOUNTER — Encounter: Payer: Self-pay | Admitting: Obstetrics & Gynecology

## 2013-03-09 ENCOUNTER — Ambulatory Visit (INDEPENDENT_AMBULATORY_CARE_PROVIDER_SITE_OTHER): Payer: No Typology Code available for payment source | Admitting: Obstetrics & Gynecology

## 2013-03-09 VITALS — BP 123/80 | HR 76 | Temp 98.8°F | Ht 62.0 in | Wt 216.0 lb

## 2013-03-09 DIAGNOSIS — D259 Leiomyoma of uterus, unspecified: Secondary | ICD-10-CM

## 2013-03-09 DIAGNOSIS — D219 Benign neoplasm of connective and other soft tissue, unspecified: Secondary | ICD-10-CM

## 2013-03-09 NOTE — Progress Notes (Signed)
Patient ID: Ann [name], female   DOB: [birth year], 52 y.o.   MRN: [id number] Patient desires surgical management with total abdominal hysterectomy with bilateral salpingectomy.  The risks of surgery were discussed in detail with the patient including but not limited to: bleeding which may require transfusion or reoperation; infection which may require prolonged hospitalization or re-hospitalization and antibiotic therapy; injury to bowel, bladder, ureters and major vessels or other surrounding organs; need for additional procedures including laparotomy; thromboembolic phenomenon, incisional problems and other postoperative or anesthesia complications.  Patient was told that the likelihood that her condition and symptoms will be treated effectively with this surgical management was very high; the postoperative expectations were also discussed in detail. The patient also understands the alternative treatment options which were discussed in full. All questions were answered.   Pt with get preop clearance from primary care physician Dr. Tyrell Antonio.

## 2013-03-09 NOTE — Patient Instructions (Signed)
Hysterectomy  Care After  Refer to this sheet in the next few weeks. These instructions provide you with information on caring for yourself after your procedure. Your caregiver may also give you more specific instructions. Your treatment has been planned according to current medical practices, but problems sometimes occur. Call your caregiver if you have any problems or questions after your procedure.  HOME CARE INSTRUCTIONS   Healing will take time. You may have discomfort, tenderness, swelling, and bruising at the surgical site for about 2 weeks. This is normal and will get better as time goes on.  · Only take over-the-counter or prescription medicines for pain, discomfort, or fever as directed by your caregiver.  · Do not take aspirin. It can cause bleeding.  · Do not drive when taking pain medicine.  · Follow your caregiver's advice regarding exercise, lifting, driving, and general activities.  · Resume your usual diet as directed and allowed.  · Get plenty of rest and sleep.  · Do not douche, use tampons, or have sexual intercourse for at least 6 weeks or until your caregiver gives you permission.  · Change your bandages (dressings) as directed by your caregiver.  · Monitor your temperature.  · Take showers instead of baths for 2 to 3 weeks.  · Do not drink alcohol until your caregiver gives you permission.  · If you are constipated, you may take a mild laxative with your caregiver's permission. Bran foods may help with constipation problems. Drinking enough fluids to keep your urine clear or pale yellow may help as well.  · Try to have someone home with you for 1 or 2 weeks to help around the house.  · Keep all of your follow-up appointments as directed by your caregiver.  SEEK MEDICAL CARE IF:   · You have swelling, redness, or increasing pain in the surgical cut (incision) area.  · You have pus coming from the incision.  · You notice a bad smell coming from the incision or dressing.  · You have swelling,  redness, or pain around the intravenous (IV) site.  · Your incision breaks open.  · You feel dizzy or lightheaded.  · You have pain or bleeding when you urinate.  · You have persistent diarrhea.  · You have persistent nausea and vomiting.  · You have abnormal vaginal discharge.  · You have a rash.  · You have any type of abnormal reaction or develop an allergy to your medicine.  · Your pain is not controlled with your prescribed medicine.  SEEK IMMEDIATE MEDICAL CARE IF:   · You have a fever.  · You have severe abdominal pain.  · You have chest pain.  · You have shortness of breath.  · You faint.  · You have pain, swelling, or redness of your leg.  · You have heavy vaginal bleeding with blood clots.  MAKE SURE YOU:  · Understand these instructions.  · Will watch your condition.  · Will get help right away if you are not doing well or get worse.  Document Released: 11/15/2004 Document Revised: 07/21/2011 Document Reviewed: 12/13/2010  ExitCare® Patient Information ©2014 [employer].      Hysterectomy Information   A hysterectomy is a procedure where your uterus is surgically removed. It will no longer be possible to have menstrual periods or to become pregnant. The tubes and ovaries can be removed (bilateral salpingo-oopherectomy) during this surgery as well.   REASONS FOR A HYSTERECTOMY  · Persistent, abnormal bleeding.  ·   Lasting (chronic) pelvic pain or infection.  · The lining of the uterus (endometrium) starts growing outside the uterus (endometriosis).  · The endometrium starts growing in the muscle of the uterus (adenomyosis).  · The uterus falls down into the vagina (pelvic organ prolapse).  · Symptomatic uterine fibroids.  · Precancerous cells.  · Cervical cancer or uterine cancer.  TYPES OF HYSTERECTOMIES  · Supracervical hysterectomy. This type removes the top part of the uterus, but not the cervix.  · Total hysterectomy. This type removes the uterus and cervix.  · Radical hysterectomy. This type removes  the uterus, cervix, and the fibrous tissue that holds the uterus in place in the pelvis (parametrium).  WAYS A HYSTERECTOMY CAN BE PERFORMED  · Abdominal hysterectomy. A large surgical cut (incision) is made in the abdomen. The uterus is removed through this incision.  · Vaginal hysterectomy. An incision is made in the vagina. The uterus is removed through this incision. There are no abdominal incisions.  · Conventional laparoscopic hysterectomy. A thin, lighted tube with a camera (laparoscope) is inserted into 3 or 4 small incisions in the abdomen. The uterus is cut into small pieces. The small pieces are removed through the incisions, or they are removed through the vagina.  · Laparoscopic assisted vaginal hysterectomy (LAVH). Three or four small incisions are made in the abdomen. Part of the surgery is performed laparoscopically and part vaginally. The uterus is removed through the vagina.  · Robot-assisted laparoscopic hysterectomy. A laparoscope is inserted into 3 or 4 small incisions in the abdomen. A computer-controlled device is used to give the surgeon a 3D image. This allows for more precise movements of surgical instruments. The uterus is cut into small pieces and removed through the incisions or removed through the vagina.  RISKS OF HYSTERECTOMY   · Bleeding and risk of blood transfusion. Tell your caregiver if you do not want to receive any blood products.  · Blood clots in the legs or lung.  · Infection.  · Injury to surrounding organs.  · Anesthesia problems or side effects.  · Conversion to an abdominal hysterectomy.  WHAT TO EXPECT AFTER A HYSTERECTOMY  · You will be given pain medicine.  · You will need to have someone with you for the first 3 to 5 days after you go home.  · You will need to follow up with your surgeon in 2 to 4 weeks after surgery to evaluate your progress.  · You may have early menopause symptoms like hot flashes, night sweats, and insomnia.  · If you had a hysterectomy for a  problem that was not a cancer or a condition that could lead to cancer, then you no longer need Pap tests. However, even if you no longer need a Pap test, a regular exam is a good idea to make sure no other problems are starting.  Document Released: 10/22/2000 Document Revised: 07/21/2011 Document Reviewed: 12/07/2010  ExitCare® Patient Information ©2014 [employer].

## 2013-03-10 ENCOUNTER — Inpatient Hospital Stay (HOSPITAL_COMMUNITY): Admission: RE | Admit: 2013-03-10 | Payer: No Typology Code available for payment source | Source: Ambulatory Visit

## 2013-03-10 ENCOUNTER — Telehealth: Payer: Self-pay | Admitting: Internal Medicine

## 2013-03-10 NOTE — Telephone Encounter (Signed)
Pt came in to request a clearance letter for her up coming Hysterectomy surgery

## 2013-03-11 NOTE — Telephone Encounter (Signed)
Spoke with pt regarding medical clearance for upcoming surgery. Informed pt we will have paper ready for pick up Monday

## 2013-03-14 ENCOUNTER — Encounter: Payer: Self-pay | Admitting: Emergency Medicine

## 2013-03-17 ENCOUNTER — Encounter: Payer: Self-pay | Admitting: Internal Medicine

## 2013-03-23 ENCOUNTER — Inpatient Hospital Stay (HOSPITAL_COMMUNITY)
Admission: RE | Admit: 2013-03-23 | Discharge: 2013-03-24 | DRG: 743 | Disposition: A | Discharge status: Home / Self Care | Payer: Medicaid Other | Source: Ambulatory Visit | Attending: Obstetrics & Gynecology | Admitting: Obstetrics & Gynecology

## 2013-03-23 ENCOUNTER — Encounter (HOSPITAL_COMMUNITY): Payer: Self-pay

## 2013-03-23 ENCOUNTER — Encounter (HOSPITAL_COMMUNITY): Payer: Medicaid Other | Admitting: Anesthesiology

## 2013-03-23 ENCOUNTER — Encounter (HOSPITAL_COMMUNITY)
Admission: RE | Disposition: A | Discharge status: Home / Self Care | Payer: Self-pay | Source: Ambulatory Visit | Attending: Obstetrics & Gynecology

## 2013-03-23 ENCOUNTER — Inpatient Hospital Stay (HOSPITAL_COMMUNITY): Payer: Medicaid Other | Admitting: Anesthesiology

## 2013-03-23 DIAGNOSIS — D250 Submucous leiomyoma of uterus: Secondary | ICD-10-CM | POA: Diagnosis present

## 2013-03-23 DIAGNOSIS — D259 Leiomyoma of uterus, unspecified: Secondary | ICD-10-CM

## 2013-03-23 DIAGNOSIS — D251 Intramural leiomyoma of uterus: Secondary | ICD-10-CM | POA: Diagnosis present

## 2013-03-23 DIAGNOSIS — D219 Benign neoplasm of connective and other soft tissue, unspecified: Secondary | ICD-10-CM

## 2013-03-23 DIAGNOSIS — N949 Unspecified condition associated with female genital organs and menstrual cycle: Secondary | ICD-10-CM | POA: Diagnosis present

## 2013-03-23 DIAGNOSIS — D252 Subserosal leiomyoma of uterus: Principal | ICD-10-CM | POA: Diagnosis present

## 2013-03-23 DIAGNOSIS — N838 Other noninflammatory disorders of ovary, fallopian tube and broad ligament: Secondary | ICD-10-CM | POA: Diagnosis present

## 2013-03-23 HISTORY — PX: ABDOMINAL HYSTERECTOMY: SHX81

## 2013-03-23 HISTORY — PX: BILATERAL SALPINGECTOMY: SHX5743

## 2013-03-23 LAB — CBC
Hemoglobin: 13.8 g/dL (ref 12.0–15.0)
MCHC: 33.7 g/dL (ref 30.0–36.0)
MCV: 85.2 fL (ref 78.0–100.0)
Platelets: 238 10*3/uL (ref 150–400)
RBC: 4.81 MIL/uL (ref 3.87–5.11)
WBC: 7.8 10*3/uL (ref 4.0–10.5)

## 2013-03-23 LAB — TYPE AND SCREEN: ABO/RH(D): B POS

## 2013-03-23 LAB — ABO/RH: ABO/RH(D): B POS

## 2013-03-23 SURGERY — HYSTERECTOMY, ABDOMINAL
Anesthesia: General | Site: Abdomen | Wound class: Clean Contaminated

## 2013-03-23 MED ORDER — LIDOCAINE HCL (CARDIAC) 20 MG/ML IV SOLN
INTRAVENOUS | Status: AC
  Filled 2013-03-23: qty 5

## 2013-03-23 MED ORDER — POLYETHYLENE GLYCOL 3350 17 G PO PACK
17.0000 g | PACK | Freq: Every day | ORAL | Status: DC | PRN
  Filled 2013-03-23: qty 1

## 2013-03-23 MED ORDER — PROPOFOL 10 MG/ML IV BOLUS
INTRAVENOUS | Status: DC | PRN
  Administered 2013-03-23: 160 mg via INTRAVENOUS

## 2013-03-23 MED ORDER — MIDAZOLAM HCL 2 MG/2ML IJ SOLN
INTRAMUSCULAR | Status: DC | PRN
  Administered 2013-03-23: 2 mg via INTRAVENOUS

## 2013-03-23 MED ORDER — LORAZEPAM 1 MG PO TABS: 0.5000 mg | ORAL_TABLET | Freq: Two times a day (BID) | ORAL | Status: DC | PRN

## 2013-03-23 MED ORDER — ACETAMINOPHEN 160 MG/5ML PO SOLN
ORAL | Status: AC
  Administered 2013-03-23: 975 mg via ORAL
  Filled 2013-03-23: qty 40.6

## 2013-03-23 MED ORDER — METOCLOPRAMIDE HCL 5 MG/ML IJ SOLN: 10.0000 mg | Freq: Once | INTRAMUSCULAR | Status: DC | PRN

## 2013-03-23 MED ORDER — FENTANYL CITRATE 0.05 MG/ML IJ SOLN
INTRAMUSCULAR | Status: AC
  Filled 2013-03-23: qty 5

## 2013-03-23 MED ORDER — BUPIVACAINE HCL (PF) 0.5 % IJ SOLN
INTRAMUSCULAR | Status: AC
  Filled 2013-03-23: qty 30

## 2013-03-23 MED ORDER — NEOSTIGMINE METHYLSULFATE 1 MG/ML IJ SOLN
INTRAMUSCULAR | Status: AC
  Filled 2013-03-23: qty 1

## 2013-03-23 MED ORDER — SODIUM CHLORIDE 0.9 % IJ SOLN
INTRAMUSCULAR | Status: AC
  Filled 2013-03-23: qty 10

## 2013-03-23 MED ORDER — KETOROLAC TROMETHAMINE 30 MG/ML IJ SOLN: 30.0000 mg | Freq: Once | INTRAMUSCULAR | Status: DC

## 2013-03-23 MED ORDER — HYDROMORPHONE HCL PF 1 MG/ML IJ SOLN
INTRAMUSCULAR | Status: AC
  Administered 2013-03-23: 0.5 mg via INTRAVENOUS
  Filled 2013-03-23: qty 1

## 2013-03-23 MED ORDER — KETOROLAC TROMETHAMINE 30 MG/ML IJ SOLN
15.0000 mg | Freq: Once | INTRAMUSCULAR | Status: AC | PRN
  Administered 2013-03-23: 30 mg via INTRAVENOUS

## 2013-03-23 MED ORDER — MIDAZOLAM HCL 2 MG/2ML IJ SOLN
INTRAMUSCULAR | Status: AC
  Filled 2013-03-23: qty 2

## 2013-03-23 MED ORDER — DIPHENHYDRAMINE HCL 12.5 MG/5ML PO ELIX: 12.5000 mg | ORAL_SOLUTION | Freq: Four times a day (QID) | ORAL | Status: DC | PRN

## 2013-03-23 MED ORDER — NALOXONE HCL 0.4 MG/ML IJ SOLN: 0.4000 mg | INTRAMUSCULAR | Status: DC | PRN

## 2013-03-23 MED ORDER — KETOROLAC TROMETHAMINE 30 MG/ML IJ SOLN
INTRAMUSCULAR | Status: AC
  Filled 2013-03-23: qty 1

## 2013-03-23 MED ORDER — ONDANSETRON HCL 4 MG/2ML IJ SOLN
INTRAMUSCULAR | Status: AC
  Filled 2013-03-23: qty 2

## 2013-03-23 MED ORDER — PANTOPRAZOLE SODIUM 40 MG PO TBEC
DELAYED_RELEASE_TABLET | ORAL | Status: AC
  Filled 2013-03-23: qty 1

## 2013-03-23 MED ORDER — LACTATED RINGERS IV SOLN
INTRAVENOUS | Status: DC
  Administered 2013-03-23 (×3): via INTRAVENOUS

## 2013-03-23 MED ORDER — CEFAZOLIN SODIUM-DEXTROSE 2-3 GM-% IV SOLR
INTRAVENOUS | Status: AC
  Filled 2013-03-23: qty 50

## 2013-03-23 MED ORDER — GLYCOPYRROLATE 0.2 MG/ML IJ SOLN
INTRAMUSCULAR | Status: DC | PRN
  Administered 2013-03-23: 0.6 mg via INTRAVENOUS
  Administered 2013-03-23: 0.1 mg via INTRAVENOUS

## 2013-03-23 MED ORDER — HYDROCHLOROTHIAZIDE 12.5 MG PO CAPS
12.5000 mg | ORAL_CAPSULE | Freq: Every day | ORAL | Status: DC
  Filled 2013-03-23 (×2): qty 1

## 2013-03-23 MED ORDER — ONDANSETRON HCL 4 MG/2ML IJ SOLN: 4.0000 mg | Freq: Four times a day (QID) | INTRAMUSCULAR | Status: DC | PRN

## 2013-03-23 MED ORDER — PHENYLEPHRINE HCL 10 MG/ML IJ SOLN
INTRAMUSCULAR | Status: DC | PRN
  Administered 2013-03-23: 40 ug via INTRAVENOUS
  Administered 2013-03-23 (×2): 80 ug via INTRAVENOUS

## 2013-03-23 MED ORDER — PHENYLEPHRINE 40 MCG/ML (10ML) SYRINGE FOR IV PUSH (FOR BLOOD PRESSURE SUPPORT)
PREFILLED_SYRINGE | INTRAVENOUS | Status: AC
  Filled 2013-03-23: qty 5

## 2013-03-23 MED ORDER — IBUPROFEN 800 MG PO TABS: 800.0000 mg | ORAL_TABLET | Freq: Three times a day (TID) | ORAL | Status: DC | PRN

## 2013-03-23 MED ORDER — CEFAZOLIN SODIUM-DEXTROSE 2-3 GM-% IV SOLR
2.0000 g | INTRAVENOUS | Status: AC
  Administered 2013-03-23: 2 g via INTRAVENOUS

## 2013-03-23 MED ORDER — PROPOFOL 10 MG/ML IV EMUL
INTRAVENOUS | Status: AC
  Filled 2013-03-23: qty 20

## 2013-03-23 MED ORDER — OXYCODONE-ACETAMINOPHEN 5-325 MG PO TABS: 1.0000 | ORAL_TABLET | ORAL | Status: DC | PRN

## 2013-03-23 MED ORDER — PANTOPRAZOLE SODIUM 40 MG IV SOLR
40.0000 mg | Freq: Every day | INTRAVENOUS | Status: DC
  Administered 2013-03-23: 40 mg via INTRAVENOUS
  Filled 2013-03-23 (×2): qty 40

## 2013-03-23 MED ORDER — ROCURONIUM BROMIDE 100 MG/10ML IV SOLN
INTRAVENOUS | Status: DC | PRN
  Administered 2013-03-23: 50 mg via INTRAVENOUS
  Administered 2013-03-23: 10 mg via INTRAVENOUS

## 2013-03-23 MED ORDER — DIPHENHYDRAMINE HCL 50 MG/ML IJ SOLN: 12.5000 mg | Freq: Four times a day (QID) | INTRAMUSCULAR | Status: DC | PRN

## 2013-03-23 MED ORDER — KETOROLAC TROMETHAMINE 30 MG/ML IJ SOLN
30.0000 mg | Freq: Four times a day (QID) | INTRAMUSCULAR | Status: DC
  Administered 2013-03-23 – 2013-03-24 (×3): 30 mg via INTRAVENOUS
  Filled 2013-03-23 (×3): qty 1

## 2013-03-23 MED ORDER — MENTHOL 3 MG MT LOZG: 1.0000 | LOZENGE | OROMUCOSAL | Status: DC | PRN

## 2013-03-23 MED ORDER — HYDROMORPHONE HCL PF 1 MG/ML IJ SOLN
0.2500 mg | INTRAMUSCULAR | Status: DC | PRN
  Administered 2013-03-23: 0.5 mg via INTRAVENOUS
  Administered 2013-03-23 (×2): 0.25 mg via INTRAVENOUS

## 2013-03-23 MED ORDER — DOCUSATE SODIUM 100 MG PO CAPS
100.0000 mg | ORAL_CAPSULE | Freq: Two times a day (BID) | ORAL | Status: DC
  Administered 2013-03-23 – 2013-03-24 (×2): 100 mg via ORAL
  Filled 2013-03-23 (×2): qty 1

## 2013-03-23 MED ORDER — SIMETHICONE 80 MG PO CHEW: 80.0000 mg | CHEWABLE_TABLET | Freq: Four times a day (QID) | ORAL | Status: DC | PRN

## 2013-03-23 MED ORDER — ACETAMINOPHEN 160 MG/5ML PO SOLN
975.0000 mg | Freq: Once | ORAL | Status: AC
  Administered 2013-03-23: 975 mg via ORAL

## 2013-03-23 MED ORDER — LIDOCAINE HCL (CARDIAC) 20 MG/ML IV SOLN
INTRAVENOUS | Status: DC | PRN
  Administered 2013-03-23: 80 mg via INTRAVENOUS

## 2013-03-23 MED ORDER — ROCURONIUM BROMIDE 100 MG/10ML IV SOLN
INTRAVENOUS | Status: AC
  Filled 2013-03-23: qty 1

## 2013-03-23 MED ORDER — SODIUM CHLORIDE 0.9 % IJ SOLN: 9.0000 mL | INTRAMUSCULAR | Status: DC | PRN

## 2013-03-23 MED ORDER — MEPERIDINE HCL 25 MG/ML IJ SOLN: 6.2500 mg | INTRAMUSCULAR | Status: DC | PRN

## 2013-03-23 MED ORDER — GLYCOPYRROLATE 0.2 MG/ML IJ SOLN
INTRAMUSCULAR | Status: AC
  Filled 2013-03-23: qty 4

## 2013-03-23 MED ORDER — DEXAMETHASONE SODIUM PHOSPHATE 10 MG/ML IJ SOLN
INTRAMUSCULAR | Status: DC | PRN
  Administered 2013-03-23: 10 mg via INTRAVENOUS

## 2013-03-23 MED ORDER — KETOROLAC TROMETHAMINE 30 MG/ML IJ SOLN: 30.0000 mg | Freq: Four times a day (QID) | INTRAMUSCULAR | Status: DC

## 2013-03-23 MED ORDER — KCL IN DEXTROSE-NACL 20-5-0.45 MEQ/L-%-% IV SOLN
INTRAVENOUS | Status: DC
  Administered 2013-03-23 – 2013-03-24 (×3): via INTRAVENOUS
  Filled 2013-03-23 (×10): qty 1000

## 2013-03-23 MED ORDER — MORPHINE SULFATE (PF) 1 MG/ML IV SOLN
INTRAVENOUS | Status: DC
  Administered 2013-03-23: 4 mg via INTRAVENOUS
  Administered 2013-03-23: 6 mg via INTRAVENOUS
  Administered 2013-03-23: 12:00:00 via INTRAVENOUS
  Administered 2013-03-24: 1 mg via INTRAVENOUS
  Filled 2013-03-23: qty 25

## 2013-03-23 MED ORDER — ONDANSETRON HCL 4 MG/2ML IJ SOLN
INTRAMUSCULAR | Status: DC | PRN
  Administered 2013-03-23: 4 mg via INTRAVENOUS

## 2013-03-23 MED ORDER — ONDANSETRON HCL 4 MG PO TABS: 4.0000 mg | ORAL_TABLET | Freq: Four times a day (QID) | ORAL | Status: DC | PRN

## 2013-03-23 MED ORDER — DEXAMETHASONE SODIUM PHOSPHATE 10 MG/ML IJ SOLN
INTRAMUSCULAR | Status: AC
  Filled 2013-03-23: qty 1

## 2013-03-23 MED ORDER — PANTOPRAZOLE SODIUM 40 MG PO TBEC
40.0000 mg | DELAYED_RELEASE_TABLET | Freq: Once | ORAL | Status: AC
  Administered 2013-03-23: 40 mg via ORAL

## 2013-03-23 MED ORDER — LACTATED RINGERS IV SOLN
INTRAVENOUS | Status: DC
  Administered 2013-03-23: 08:00:00 via INTRAVENOUS

## 2013-03-23 MED ORDER — LISINOPRIL-HYDROCHLOROTHIAZIDE 10-12.5 MG PO TABS: 1.0000 | ORAL_TABLET | Freq: Every day | ORAL | Status: DC

## 2013-03-23 MED ORDER — NEOSTIGMINE METHYLSULFATE 1 MG/ML IJ SOLN
INTRAMUSCULAR | Status: DC | PRN
  Administered 2013-03-23: 3 mg via INTRAVENOUS

## 2013-03-23 MED ORDER — FENTANYL CITRATE 0.05 MG/ML IJ SOLN
INTRAMUSCULAR | Status: DC | PRN
  Administered 2013-03-23: 50 ug via INTRAVENOUS
  Administered 2013-03-23: 100 ug via INTRAVENOUS
  Administered 2013-03-23 (×2): 50 ug via INTRAVENOUS

## 2013-03-23 MED ORDER — BUPIVACAINE LIPOSOME 1.3 % IJ SUSP
20.0000 mL | Freq: Once | INTRAMUSCULAR | Status: DC
  Filled 2013-03-23: qty 20

## 2013-03-23 MED ORDER — BUPIVACAINE LIPOSOME 1.3 % IJ SUSP
INTRAMUSCULAR | Status: DC | PRN
  Administered 2013-03-23: 20 mL

## 2013-03-23 MED ORDER — LISINOPRIL 10 MG PO TABS
10.0000 mg | ORAL_TABLET | Freq: Every day | ORAL | Status: DC
  Filled 2013-03-23 (×2): qty 1

## 2013-03-23 SURGICAL SUPPLY — 52 items
APL SKNCLS STERI-STRIP NONHPOA (GAUZE/BANDAGES/DRESSINGS) ×2
BARRIER ADHS 3X4 INTERCEED (GAUZE/BANDAGES/DRESSINGS) IMPLANT
BENZOIN TINCTURE PRP APPL 2/3 (GAUZE/BANDAGES/DRESSINGS) ×1 IMPLANT
BINDER ABD UNIV 10 28-50 (GAUZE/BANDAGES/DRESSINGS) IMPLANT
BINDER ABD UNIV 12 45-62 (WOUND CARE) IMPLANT
BINDER ABDOM UNIV 10 (GAUZE/BANDAGES/DRESSINGS) ×3
BINDER ABDOMINAL 46IN 62IN (WOUND CARE)
BRR ADH 4X3 ABS CNTRL BYND (GAUZE/BANDAGES/DRESSINGS)
CANISTER SUCT 3000ML (MISCELLANEOUS) ×3 IMPLANT
CELLS DAT CNTRL 66122 CELL SVR (MISCELLANEOUS) IMPLANT
CHLORAPREP W/TINT 26ML (MISCELLANEOUS) ×3 IMPLANT
CLOTH BEACON ORANGE TIMEOUT ST (SAFETY) ×3 IMPLANT
CONT PATH 16OZ SNAP LID 3702 (MISCELLANEOUS) ×3 IMPLANT
DECANTER SPIKE VIAL GLASS SM (MISCELLANEOUS) ×1 IMPLANT
DRAPE WARM FLUID 44X44 (DRAPE) ×1 IMPLANT
DRESSING TELFA 8X3 (GAUZE/BANDAGES/DRESSINGS) ×3 IMPLANT
GAUZE SPONGE 4X4 12PLY STRL LF (GAUZE/BANDAGES/DRESSINGS) ×1 IMPLANT
GAUZE SPONGE 4X4 16PLY XRAY LF (GAUZE/BANDAGES/DRESSINGS) ×3 IMPLANT
GLOVE BIO SURGEON STRL SZ7 (GLOVE) ×3 IMPLANT
GLOVE BIOGEL PI IND STRL 7.0 (GLOVE) ×2 IMPLANT
GLOVE BIOGEL PI INDICATOR 7.0 (GLOVE) ×1
GOWN STRL REIN XL XLG (GOWN DISPOSABLE) ×9 IMPLANT
NEEDLE HYPO 22GX1.5 SAFETY (NEEDLE) ×3 IMPLANT
NS IRRIG 1000ML POUR BTL (IV SOLUTION) ×3 IMPLANT
PACK ABDOMINAL GYN (CUSTOM PROCEDURE TRAY) ×3 IMPLANT
PAD ABD 7.5X8 STRL (GAUZE/BANDAGES/DRESSINGS) ×5 IMPLANT
PAD OB MATERNITY 4.3X12.25 (PERSONAL CARE ITEMS) ×3 IMPLANT
PROTECTOR NERVE ULNAR (MISCELLANEOUS) ×3 IMPLANT
RETRACTOR WND ALEXIS 18 MED (MISCELLANEOUS) IMPLANT
RETRACTOR WND ALEXIS 25 LRG (MISCELLANEOUS) IMPLANT
RTRCTR WOUND ALEXIS 18CM MED (MISCELLANEOUS)
RTRCTR WOUND ALEXIS 25CM LRG (MISCELLANEOUS)
SPONGE GAUZE 4X4 12PLY (GAUZE/BANDAGES/DRESSINGS) ×3 IMPLANT
SPONGE LAP 18X18 X RAY DECT (DISPOSABLE) ×6 IMPLANT
SPONGE SURGIFOAM ABS GEL 12-7 (HEMOSTASIS) ×1 IMPLANT
STAPLER VISISTAT 35W (STAPLE) ×3 IMPLANT
STRIP CLOSURE SKIN 1/2X4 (GAUZE/BANDAGES/DRESSINGS) ×2 IMPLANT
SUT VIC AB 0 CT1 18XCR BRD8 (SUTURE) ×6 IMPLANT
SUT VIC AB 0 CT1 27 (SUTURE) ×6
SUT VIC AB 0 CT1 27XBRD ANBCTR (SUTURE) ×4 IMPLANT
SUT VIC AB 0 CT1 36 (SUTURE) ×3 IMPLANT
SUT VIC AB 0 CT1 8-18 (SUTURE) ×9
SUT VIC AB 3-0 CT1 27 (SUTURE) ×3
SUT VIC AB 3-0 CT1 TAPERPNT 27 (SUTURE) ×2 IMPLANT
SUT VIC AB 3-0 SH 27 (SUTURE)
SUT VIC AB 3-0 SH 27X BRD (SUTURE) IMPLANT
SUT VIC AB 4-0 KS 27 (SUTURE) IMPLANT
SUT VICRYL 0 TIES 12 18 (SUTURE) ×3 IMPLANT
SYR CONTROL 10ML LL (SYRINGE) ×3 IMPLANT
TOWEL OR 17X24 6PK STRL BLUE (TOWEL DISPOSABLE) ×6 IMPLANT
TRAY FOLEY CATH 14FR (SET/KITS/TRAYS/PACK) ×3 IMPLANT
WATER STERILE IRR 1000ML POUR (IV SOLUTION) ×3 IMPLANT

## 2013-03-23 NOTE — Op Note (Signed)
10:18 AM  PATIENT: Ann [name] 52 y.o. female  PRE-OPERATIVE DIAGNOSIS: cpt [phone] - Symptomatic uterine fibroids with abdominal pain  POST-OPERATIVE DIAGNOSIS: Symptomatic uterine fibroids  PROCEDURE: Procedure(s):  HYSTERECTOMY ABDOMINAL (N/A)  BILATERAL SALPINGECTOMY (Bilateral)  SURGEON: Surgeon(s) and Role:  * Willodean Rosenthal, MD - Primary  * Reva Bores, MD - Assisting  ANESTHESIA: local and general  EBL: Total I/O  In: 2000 [I.V.:2000]  Out: 650 [Urine:150; Blood:500]  BLOOD ADMINISTERED:none  DRAINS: none  LOCAL MEDICATIONS USED: OTHER Exparel  SPECIMEN: Source of Specimen: uterus, cervix and fallopian tubes  DISPOSITION OF SPECIMEN: PATHOLOGY  COUNTS: YES  TOURNIQUET: * No tourniquets in log *  DICTATION: .Note written in EPIC  PLAN OF CARE: Admit to inpatient  PATIENT DISPOSITION: PACU - hemodynamically stable.  Delay start of Pharmacological VTE agent (>24hrs) due to surgical blood loss or risk of bleeding: yes    INDICATIONS: The patient is a 51 yo [id number] with the aforementioned diagnoses who desires definitive surgical management. On the preoperative visit, the risks, benefits, indications, and alternatives of the procedure were reviewed with the patient. On the day of surgery, the risks of surgery were again discussed with the patient including but not limited to: bleeding which may require transfusion or reoperation; infection which may require antibiotics; injury to bowel, bladder, ureters or other surrounding organs; need for additional procedures; thromboembolic phenomenon, incisional problems and other postoperative/anesthesia complications. Written informed consent was obtained.  OPERATIVE FINDINGS: A 18 week sized fibroids uterus with multiple fibroids.  Normal tubes and ovaries bilaterally.  SPECIMENS: Uterus,cervix, bilateral fallopian tubes sent to pathology  COMPLICATIONS: none.  DESCRIPTION OF PROCEDURE: The patient received intravenous  antibiotics and had sequential compression devices applied to her lower extremities while in the preoperative area. She was taken to the operating room and placed under general anesthesia without difficulty. The abdomen and perineum were prepped and draped in a sterile manner, and she was placed in a dorsal supine position. A Foley catheter was inserted into the bladder and attached to constant drainage. After an adequate timeout was performed, a transverse skin incision was made. This incision was taken down to the fascia using a scalpel and cautery with care given to maintain good hemostasis. The fascia was grasped with kocher clamps, tented up and the rectus muscles were dissected off using sharp and blunt dissection on the superior and inferior aspects of the fascial incision. The peritoneum entered sharply without complication. This peritoneal cavity was entered digitally. Attention was then turned to the pelvis. The uterus at this point was noted to be mobilized and was delivered up out of the abdomen. The bowel was packed away with moist laparotomy sponges. The round ligaments on each side were clamped, suture ligated with 0 Vicryl, and transected with electrocautery allowing entry into the broad ligament. Of note, all sutures used in this procedure are 0 Vicryl unless otherwise noted. The anterior and posterior leaves of the broad ligament were separated, and the ureters were inspected to be safely away from the area of dissection bilaterally. The uteroovarian ligaments were bliaterally clamped and doubly suture ligated. The uterine arteries were skeletinized bilaterally A bladder flap was then created. The bladder was then bluntly dissected off the lower uterine segment and cervix with good hemostasis noted. The uterine arteries were then skeletonized bilaterally and then clamped, cut, and doubly suture ligated with care given to prevent ureteral injury. After the uterine arteries were clamped, the uterus  was removed with a scalpel and  the cervical stump was grasped with a Jacob's tenaculum. The uterosacral ligaments were then clamped, cut, and ligated bilaterally. Finally, the cardinal ligaments were clamped, cut, and ligated bilaterally. The uterus was removed.  The vaginal cuff was reefed with 0 vicryl in a running locked fashion and the cuff angles were closed with figure-of-eight sutures.   At this point the fallopian tubes were grasped with a Babcock clamp.  Using a kelly clamp the mesosalpinx was clamped cut and suture ligated, excellent hemostasis was noted.  The pelvis was irrigated and hemostasis was reconfirmed at all pedicles and along the pelvic sidewall. The ureters were inspected and noted to be peristalsing bilaterally. All laparotomy sponges and instruments were removed from the abdomen. A small piece of gel foam was placed over the vaginal cuff after the pelvis was irrigated.  The peritoneum was reapproximated with 0 vicryl.  The fascia was closed with 0 vicryl . The subcutaneous fascia was closed with 3-0 vicryl and the skin was a subcuticular suture of 4-0 vicryl. The incision was injected with 40cc of Exparel mixed with sterile saline. Benzoin and steristrips were applied.  Sponge, lap and needle counts were correct times two. The patient was taken to the recovery area awake, extubated and in stable condition. There were no immediate complications.

## 2013-03-23 NOTE — H&P (Signed)
HPI 52 yo G3P2102 LMP 09/2012 Pt c/o pelvic pain and pressure and frequent voids. She reports that she was evaluated and advised to have a hyst with Dr. Tresa Res in 2008 but, lost her job. She is not having regular cycles but, she feels that her pressure is worse than previously. Her last PAP was earlier this year.  Past Medical History   Diagnosis  Date   .  Headache(784.0)    .  Hypertension     Past Surgical History   Procedure  Laterality  Date   .  Colposcopy     .  Foot surgery       right 5th toe-bone removed [id number]    Current Outpatient Prescriptions on File Prior to Visit   Medication  Sig  Dispense  Refill   .  acetaminophen (TYLENOL) 325 MG tablet  Take 2 tablets (650 mg total) by mouth every 6 (six) hours as needed for pain.  30 tablet    .  IBUPROFEN PO  Take 500 mg by mouth as needed. Prn pain     .  lisinopril-hydrochlorothiazide (ZESTORETIC) 10-12.5 MG per tablet  Take 1 tablet by mouth daily.  30 tablet  3   .  Multiple Vitamin (MULTIVITAMIN) tablet  Take 1 tablet by mouth daily.     .  medroxyPROGESTERone (PROVERA) 10 MG tablet  Take 2 tablets (20 mg total) by mouth daily.  30 tablet  2   .  MOVIPREP 100 G SOLR  Take 1 kit (200 g total) by mouth once.  1 kit  0    No current facility-administered medications on file prior to visit.    History    Social History   .  Marital Status:  Single     Spouse Name:  N/A     Number of Children:  N/A   .  Years of Education:  N/A    Occupational History   .  Not on file.    Social History Main Topics   .  Smoking status:  Never Smoker   .  Smokeless tobacco:  Never Used   .  Alcohol Use:  Yes   .  Drug Use:  No   .  Sexual Activity:  Not on file    Other Topics  Concern   .  Not on file    Social History Narrative   .  No narrative on file    Family History   Problem  Relation  Age of Onset   .  Arthritis  Mother    .  Stroke  Mother    .  Hypertension  Mother    .  Arthritis  Father    .  Cancer  Father     prostate   .  Stroke  Father    .  Alcohol abuse  Brother    .  Stroke  Brother    .  Cancer  Paternal Aunt      breast and uterine   .  Colon cancer  Neg Hx    Review of Systems [place]  No Known Allergies   Objective:   Physical Exam BP 110/79  Pulse 73  Temp(Src) 98.6 F (37 C) (Oral)  Resp 20  SpO2 99% Pt in NAD Lungs: CTA  CV: RRR  Abd: obese, NT, ND. Mass palpable at umb  GU:  EGBUS: no lesions  Vagina: no blood in vault  Cervix: no lesion; no CMT  Uterus: 20 weeks  sized with wide base.  Adnexa: difficult to assess due to mass effect of uterus  Assessment:   Uterine fibroids. symptomatic Pt wants surgical management. D/w pt surgical options. suspect too large for laparoscopic hyst especially due to wide base.  Plan:   Patient desires surgical management with TAH with bilateral salpingectomy. The risks of surgery were discussed in detail with the patient including but not limited to: bleeding which may require transfusion or reoperation; infection which may require prolonged hospitalization or re-hospitalization and antibiotic therapy; injury to bowel, bladder, ureters and major vessels or other surrounding organs; need for additional procedures including laparotomy; thromboembolic phenomenon, incisional problems and other postoperative or anesthesia complications. Patient was told that the likelihood that her condition and symptoms will be treated effectively with this surgical management was very high; the postoperative expectations were also discussed in detail. The patient also understands the alternative treatment options which were discussed in full. All questions were answered.

## 2013-03-23 NOTE — Anesthesia Postprocedure Evaluation (Signed)
Anesthesia Post Note  Patient: Ann [name]  Procedure(s) Performed: Procedure(s): HYSTERECTOMY ABDOMINAL (N/A) BILATERAL SALPINGECTOMY (Bilateral)  Anesthesia type: General  Patient location: [facility]  Post pain: Pain level controlled  Post assessment: Post-op Vital signs reviewed  Last Vitals: BP 116/76  Pulse 90  Temp(Src) 36.3 C (Oral)  Resp 20  SpO2 100%  Post vital signs: Reviewed  Level of consciousness: awake  Complications: No apparent anesthesia complications

## 2013-03-23 NOTE — Transfer of Care (Signed)
Immediate Anesthesia Transfer of Care Note  Patient: Ann [name]  Procedure(s) Performed: Procedure(s): HYSTERECTOMY ABDOMINAL (N/A) BILATERAL SALPINGECTOMY (Bilateral)  Patient Location: PACU  Anesthesia Type:General  Level of Consciousness: awake, alert  and oriented  Airway & Oxygen Therapy: Patient Spontanous Breathing and Patient connected to nasal cannula oxygen  Post-op Assessment: Report given to PACU RN and Post -op Vital signs reviewed and stable  Post vital signs: Reviewed and stable  Complications: No apparent anesthesia complications

## 2013-03-23 NOTE — Anesthesia Preprocedure Evaluation (Addendum)
Anesthesia Evaluation  Patient identified by MRN, date of birth, ID band Patient awake    Reviewed: Allergy & Precautions, H&P , NPO status , Patient's Chart, lab work & pertinent test results, reviewed documented beta blocker date and time   History of Anesthesia Complications Negative for: history of anesthetic complications  Airway Mallampati: I TM Distance: >3 FB Neck ROM: full    Dental no notable dental hx. (+) Teeth Intact   Pulmonary neg pulmonary ROS,  breath sounds clear to auscultation  Pulmonary exam normal       Cardiovascular hypertension, On Medications + dysrhythmias (palpitations - evaluated and told not cardiac) Rhythm:regular Rate:Normal     Neuro/Psych  Headaches, PSYCHIATRIC DISORDERS (anxiety)  Neuromuscular disease (left shoulder and lower back pain, with left leg sciatica)    GI/Hepatic Neg liver ROS, GERD-  Medicated,  Endo/Other  Morbid obesity  Renal/GU negative Renal ROS  Female GU complaint     Musculoskeletal   Abdominal Normal abdominal exam  (+)   Peds  Hematology negative hematology ROS (+)   Anesthesia Other Findings   Reproductive/Obstetrics negative OB ROS                          Anesthesia Physical Anesthesia Plan  ASA: III  Anesthesia Plan: General ETT   Post-op Pain Management:    Induction:   Airway Management Planned:   Additional Equipment:   Intra-op Plan:   Post-operative Plan:   Informed Consent: I have reviewed the patients History and Physical, chart, labs and discussed the procedure including the risks, benefits and alternatives for the proposed anesthesia with the patient or authorized representative who has indicated his/her understanding and acceptance.   Dental Advisory Given  Plan Discussed with: CRNA and Surgeon  Anesthesia Plan Comments:         Anesthesia Quick Evaluation

## 2013-03-23 NOTE — Brief Op Note (Signed)
03/23/2013  10:18 AM  PATIENT:  Ann [name]  52 y.o. female  PRE-OPERATIVE DIAGNOSIS:  cpt [phone] - Symptomatic uterine fibroids with abdominal pain  POST-OPERATIVE DIAGNOSIS:  Symptomatic uterine fibroids   PROCEDURE:  Procedure(s): HYSTERECTOMY ABDOMINAL (N/A) BILATERAL SALPINGECTOMY (Bilateral)  SURGEON:  Surgeon(s) and Role:    * Willodean Rosenthal, MD - Primary    * Reva Bores, MD - Assisting  ANESTHESIA:   local and general  EBL:  Total I/O In: 2000 [I.V.:2000] Out: 650 [Urine:150; Blood:500]  BLOOD ADMINISTERED:none  DRAINS: none   LOCAL MEDICATIONS USED:  OTHER Exparel  SPECIMEN:  Source of Specimen:  uterus, cervix and fallopian tubes  DISPOSITION OF SPECIMEN:  PATHOLOGY  COUNTS:  YES  TOURNIQUET:  * No tourniquets in log *  DICTATION: .Note written in EPIC  PLAN OF CARE: Admit to inpatient   PATIENT DISPOSITION:  PACU - hemodynamically stable.   Delay start of Pharmacological VTE agent (>24hrs) due to surgical blood loss or risk of bleeding: yes

## 2013-03-23 NOTE — Anesthesia Procedure Notes (Signed)
Procedure Name: Intubation Date/Time: 03/23/2013 8:40 AM Performed by: Graciela Husbands Pre-anesthesia Checklist: Suction available, Emergency Drugs available, Timeout performed, Patient being monitored and Patient identified Patient Re-evaluated:Patient Re-evaluated prior to inductionOxygen Delivery Method: Circle system utilized Preoxygenation: Pre-oxygenation with 100% oxygen Intubation Type: IV induction Ventilation: Oral airway inserted - appropriate to patient size and Mask ventilation without difficulty Laryngoscope Size: Mac and 3 Grade View: Grade II Tube size: 7.0 mm Number of attempts: 1 Airway Equipment and Method: Patient positioned with wedge pillow and Stylet Placement Confirmation: ETT inserted through vocal cords under direct vision,  positive ETCO2 and breath sounds checked- equal and bilateral Secured at: 21 cm Tube secured with: Tape Dental Injury: Teeth and Oropharynx as per pre-operative assessment

## 2013-03-23 NOTE — Progress Notes (Signed)
Ur chart review completed.  

## 2013-03-23 NOTE — Anesthesia Postprocedure Evaluation (Signed)
  Anesthesia Post-op Note  Patient: Ann [name]  Procedure(s) Performed: Procedure(s): HYSTERECTOMY ABDOMINAL (N/A) BILATERAL SALPINGECTOMY (Bilateral) Patient is awake and responsive. Pain and nausea are reasonably well controlled. Vital signs are stable and clinically acceptable. Oxygen saturation is clinically acceptable. There are no apparent anesthetic complications at this time. Patient is ready for discharge.

## 2013-03-24 ENCOUNTER — Encounter (HOSPITAL_COMMUNITY): Payer: Self-pay | Admitting: Obstetrics & Gynecology

## 2013-03-24 LAB — CBC
HCT: 32.7 % — ABNORMAL LOW (ref 36.0–46.0)
MCHC: 33.3 g/dL (ref 30.0–36.0)
MCV: 85.8 fL (ref 78.0–100.0)
Platelets: 211 10*3/uL (ref 150–400)
RDW: 15.2 % (ref 11.5–15.5)
WBC: 10.1 10*3/uL (ref 4.0–10.5)

## 2013-03-24 LAB — BASIC METABOLIC PANEL
BUN: 12 mg/dL (ref 6–23)
Chloride: 105 mEq/L (ref 96–112)
Creatinine, Ser: 0.95 mg/dL (ref 0.50–1.10)
GFR calc Af Amer: 79 mL/min — ABNORMAL LOW (ref >90)
GFR calc non Af Amer: 68 mL/min — ABNORMAL LOW (ref >90)
Sodium: 138 mEq/L (ref 135–145)

## 2013-03-24 MED ORDER — OXYCODONE-ACETAMINOPHEN 5-325 MG PO TABS: 1.0000 | ORAL_TABLET | ORAL | Status: DC | PRN

## 2013-03-24 MED ORDER — DSS 100 MG PO CAPS: 100.0000 mg | ORAL_CAPSULE | Freq: Two times a day (BID) | ORAL | Status: DC

## 2013-03-24 MED ORDER — IBUPROFEN 800 MG PO TABS: 800.0000 mg | ORAL_TABLET | Freq: Three times a day (TID) | ORAL | Status: DC | PRN

## 2013-03-24 NOTE — Progress Notes (Signed)
Pt is discharged in the care of Aunt. Downstairs per wheelchair. Stable. Abdominal incision clean and dry. Discharge instructions with Rx were given to pt. Questions were asked and answered. Denies any pain or discomfort.Pt was escorted by N>T> to car.

## 2013-03-24 NOTE — Discharge Summary (Signed)
Physician Discharge Summary  Patient ID: Ann [name] MRN: [id number] DOB/AGE: 15-Jun-1960 52 y.o.  Admit date: 03/23/2013 Discharge date: 03/24/2013  Admission Diagnoses:  Discharge Diagnoses:  Active Problems:   * No active hospital problems. *   Discharged Condition: good  Hospital Course: Pt was admitted for surgery.  Tolerated regular diet last night.  She is voiding and had passed gas and has not been using PCA.  She has good pain control and requests discharge.  no problems reported overnight.    Consults: None  Significant Diagnostic Studies: labs: CBC, Chem 7  Treatments: surgery: TAH with bilateral salpingectomy  Discharge Exam: Blood pressure 108/5, pulse 102, temperature 98.3 F (36.8 C), temperature source Oral, resp. rate 16, height 5\' 2"  (1.575 m), weight 216 lb (97.977 kg), SpO2 98.00%. General appearance: alert and no distress GI: soft, non-tender; bowel sounds normal; no masses,  no organomegaly Incision/Wound:dressing in place and dry  CBC    Component Value Date/Time   WBC 10.1 03/24/2013 0505   RBC 3.81* 03/24/2013 0505   HGB 10.9* 03/24/2013 0505   HCT 32.7* 03/24/2013 0505   PLT 211 03/24/2013 0505   MCV 85.8 03/24/2013 0505   MCH 28.6 03/24/2013 0505   MCHC 33.3 03/24/2013 0505   RDW 15.2 03/24/2013 0505   LYMPHSABS 1.9 02/17/2013 1646   MONOABS 0.3 02/17/2013 1646   EOSABS 0.0 02/17/2013 1646   BASOSABS 0.0 02/17/2013 1646      Disposition: 01-Home or Self Care  Discharge Orders   Future Orders Complete By Expires   Call MD for:  difficulty breathing, headache or visual disturbances  As directed    Call MD for:  persistant dizziness or light-headedness  As directed    Call MD for:  persistant nausea and vomiting  As directed    Call MD for:  redness, tenderness, or signs of infection (pain, swelling, redness, odor or green/yellow discharge around incision site)  As directed    Call MD for:  severe uncontrolled pain  As directed    Call  MD for:  temperature >100.4  As directed    Diet - low sodium heart healthy  As directed    Driving Restrictions  As directed    Comments:     None for 2 weeks   Increase activity slowly  As directed    Lifting restrictions  As directed    Comments:     No heavy lifting   Remove dressing in 24 hours  As directed    Sexual Activity Restrictions  As directed    Comments:     None for 6 weeks       Medication List    STOP taking these medications       OVER THE COUNTER MEDICATION      TAKE these medications       acetaminophen 500 MG tablet  Commonly known as:  TYLENOL  Take 1,000 mg by mouth every 6 (six) hours as needed (for headache).     aspirin EC 81 MG tablet  Take 81 mg by mouth daily.     DSS 100 MG Caps  Take 100 mg by mouth 2 (two) times daily.     ibuprofen 800 MG tablet  Commonly known as:  ADVIL,MOTRIN  Take 1 tablet (800 mg total) by mouth every 8 (eight) hours as needed (mild pain).     lisinopril-hydrochlorothiazide 10-12.5 MG per tablet  Commonly known as:  ZESTORETIC  Take 1 tablet by mouth daily.  LORazepam 0.5 MG tablet  Commonly known as:  ATIVAN  Take 1 tablet (0.5 mg total) by mouth 2 (two) times daily as needed for anxiety.     oxyCODONE-acetaminophen 5-325 MG per tablet  Commonly known as:  PERCOCET/ROXICET  Take 1-2 tablets by mouth every 4 (four) hours as needed for severe pain (moderate to severe pain (when tolerating fluids)).     ranitidine 150 MG capsule  Commonly known as:  ZANTAC  Take 1 capsule (150 mg total) by mouth daily.           Follow-up Information   Follow up with Willodean Rosenthal, MD In 2 weeks.   Specialty:  Obstetrics and Gynecology   Contact information:   [address] [place] [address] [phone]       Signed: Willodean Rosenthal 03/24/2013, 3:50 PM

## 2013-03-31 ENCOUNTER — Encounter: Payer: Self-pay | Admitting: *Deleted

## 2013-04-04 ENCOUNTER — Ambulatory Visit (INDEPENDENT_AMBULATORY_CARE_PROVIDER_SITE_OTHER): Payer: No Typology Code available for payment source | Admitting: Obstetrics & Gynecology

## 2013-04-04 ENCOUNTER — Encounter: Payer: Self-pay | Admitting: Obstetrics & Gynecology

## 2013-04-04 VITALS — BP 113/78 | HR 78 | Ht 62.0 in | Wt 214.4 lb

## 2013-04-04 DIAGNOSIS — Z09 Encounter for follow-up examination after completed treatment for conditions other than malignant neoplasm: Secondary | ICD-10-CM

## 2013-04-04 DIAGNOSIS — Z9889 Other specified postprocedural states: Secondary | ICD-10-CM

## 2013-04-04 NOTE — Progress Notes (Signed)
Subjective:     Patient ID: Ann [name], female   DOB: 1960-09-11, 52 y.o.   MRN: [id number]  HPI Pt with no complaints.  Taking only min narc.  Taking percocet daily.    Review of Systems     Objective:   Physical ExamPt in NAD BP 113/78  Pulse 78  Ht 5\' 2"  (1.575 m)  Wt 214 lb 6.4 oz (97.251 kg)  BMI 39.20 kg/m2  LMP 10/03/2012 Abd: obese; NT, ND Incision- clean, dry, steri-strips wet and removed.   03/23/2013 Diagnosis Uterus and bilateral fallopian tubes, with cervix - LEIOMYOMATA. - ENDOMETRIUM: BENIGN PROLIFERATIVE ENDOMETRIUM, NO ATYPIA, HYPERPLASIA OR MALIGNANCY. - CERVIX: BENIGN SQUAMOUS MUCOSA AND ENDOCERVICAL MUCOSA, NO DYSPLASIA OR MAIGNANCY.. - UTERINE SEROSA: UNREMARKABLE. - BILATERAL FALLOPIAN TUBES: BENIGN FALLOPIAN TUBAL TISSUE, NO EVIDENCE OF ENDOMETRIOSIS, ATYPIA OR MALIGNANCY.    Assessment:     2 week post op check       Plan:     F/u weeks Reviewed surg path Gradually increase activity Nothing in vagina

## 2013-04-04 NOTE — Patient Instructions (Signed)
Hysterectomy °Care After °Refer to this sheet in the next few weeks. These instructions provide you with information on caring for yourself after your procedure. Your caregiver may also give you more specific instructions. Your treatment has been planned according to current medical practices, but problems sometimes occur. Call your caregiver if you have any problems or questions after your procedure. °HOME CARE INSTRUCTIONS  °Healing will take time. You may have discomfort, tenderness, swelling, and bruising at the surgical site for about 2 weeks. This is normal and will get better as time goes on. °· Only take over-the-counter or prescription medicines for pain, discomfort, or fever as directed by your caregiver. °· Do not take aspirin. It can cause bleeding. °· Do not drive when taking pain medicine. °· Follow your caregiver's advice regarding exercise, lifting, driving, and general activities. °· Resume your usual diet as directed and allowed. °· Get plenty of rest and sleep. °· Do not douche, use tampons, or have sexual intercourse for at least 6 weeks or until your caregiver gives you permission. °· Change your bandages (dressings) as directed by your caregiver. °· Monitor your temperature. °· Take showers instead of baths for 2 to 3 weeks. °· Do not drink alcohol until your caregiver gives you permission. °· If you are constipated, you may take a mild laxative with your caregiver's permission. Bran foods may help with constipation problems. Drinking enough fluids to keep your urine clear or pale yellow may help as well. °· Try to have someone home with you for 1 or 2 weeks to help around the house. °· Keep all of your follow-up appointments as directed by your caregiver. °SEEK MEDICAL CARE IF:  °· You have swelling, redness, or increasing pain in the surgical cut (incision) area. °· You have pus coming from the incision. °· You notice a bad smell coming from the incision or dressing. °· You have swelling,  redness, or pain around the intravenous (IV) site. °· Your incision breaks open. °· You feel dizzy or lightheaded. °· You have pain or bleeding when you urinate. °· You have persistent diarrhea. °· You have persistent nausea and vomiting. °· You have abnormal vaginal discharge. °· You have a rash. °· You have any type of abnormal reaction or develop an allergy to your medicine. °· Your pain is not controlled with your prescribed medicine. °SEEK IMMEDIATE MEDICAL CARE IF:  °· You have a fever. °· You have severe abdominal pain. °· You have chest pain. °· You have shortness of breath. °· You faint. °· You have pain, swelling, or redness of your leg. °· You have heavy vaginal bleeding with blood clots. °MAKE SURE YOU: °· Understand these instructions. °· Will watch your condition. °· Will get help right away if you are not doing well or get worse. °Document Released: 11/15/2004 Document Revised: 07/21/2011 Document Reviewed: 12/13/2010 °ExitCare® Patient Information ©2014 [employer]. ° °

## 2013-05-09 ENCOUNTER — Ambulatory Visit (INDEPENDENT_AMBULATORY_CARE_PROVIDER_SITE_OTHER): Payer: No Typology Code available for payment source | Admitting: Obstetrics & Gynecology

## 2013-05-09 ENCOUNTER — Encounter: Payer: Self-pay | Admitting: Obstetrics & Gynecology

## 2013-05-09 VITALS — BP 98/68 | HR 90 | Temp 98.1°F | Ht 62.0 in | Wt 217.8 lb

## 2013-05-09 DIAGNOSIS — Z09 Encounter for follow-up examination after completed treatment for conditions other than malignant neoplasm: Secondary | ICD-10-CM

## 2013-05-09 DIAGNOSIS — R232 Flushing: Secondary | ICD-10-CM

## 2013-05-09 DIAGNOSIS — Z9889 Other specified postprocedural states: Secondary | ICD-10-CM

## 2013-05-09 MED ORDER — ESTRADIOL 0.5 MG PO TABS: 0.5000 mg | ORAL_TABLET | Freq: Every day | ORAL | Status: DC

## 2013-05-09 NOTE — Patient Instructions (Signed)

## 2013-05-09 NOTE — Progress Notes (Signed)
Subjective:     Patient ID: Ann [name], female   DOB: 27-Aug-1960, 52 y.o.   MRN: [id number]  HPI Pt with no post op complaints.  Voiding and passing stool without problems.  No bleeding. Tolerating a reg diet.   Review of Systems     Objective:   Physical Exam BP 98/68  Pulse 90  Temp(Src) 98.1 F (36.7 C)  Ht 5\' 2"  (1.575 m)  Wt 217 lb 12.8 oz (98.793 kg)  BMI 39.83 kg/m2  LMP 10/03/2012 Abd: soft ND, NT GU: EGBUS: no lesions Vagina: no blood in vault; cuff well healed  Adnexa: no masses; non tender          Assessment:     6 week post op check Menopausal state- vasomotor sx.  Reviewed with pt the potential risks of EEs including a slight increased risk of breast CA.  Will prescribe the lowest does for the shorted amount of time    Plan:     Estrace 0.5mg   F/u 3 months or sooner prn return to full activities

## 2013-06-03 ENCOUNTER — Other Ambulatory Visit: Payer: Self-pay | Admitting: Internal Medicine

## 2013-06-08 ENCOUNTER — Encounter: Payer: Self-pay | Admitting: Internal Medicine

## 2013-06-08 ENCOUNTER — Ambulatory Visit: Payer: No Typology Code available for payment source | Attending: Internal Medicine | Admitting: Internal Medicine

## 2013-06-08 ENCOUNTER — Other Ambulatory Visit: Payer: Self-pay | Admitting: Internal Medicine

## 2013-06-08 VITALS — BP 131/85 | HR 82 | Temp 98.4°F | Resp 16 | Ht 62.0 in | Wt 224.0 lb

## 2013-06-08 DIAGNOSIS — Z1211 Encounter for screening for malignant neoplasm of colon: Secondary | ICD-10-CM

## 2013-06-08 DIAGNOSIS — F411 Generalized anxiety disorder: Secondary | ICD-10-CM | POA: Insufficient documentation

## 2013-06-08 DIAGNOSIS — I10 Essential (primary) hypertension: Secondary | ICD-10-CM

## 2013-06-08 DIAGNOSIS — Z139 Encounter for screening, unspecified: Secondary | ICD-10-CM

## 2013-06-08 MED ORDER — LISINOPRIL-HYDROCHLOROTHIAZIDE 10-12.5 MG PO TABS: ORAL_TABLET | ORAL | Status: DC

## 2013-06-08 MED ORDER — LORAZEPAM 0.5 MG PO TABS: 0.5000 mg | ORAL_TABLET | Freq: Two times a day (BID) | ORAL | Status: DC | PRN

## 2013-06-08 NOTE — Progress Notes (Signed)
MRN: [id number] Name: Ann [name]  Sex: female Age: 53 y.o. DOB: 04-12-1961  Allergies: Review of patient's allergies indicates no known allergies.  Chief Complaint  Patient presents with  . Follow-up    HPI: Patient is 53 y.o. female who has history of hypertension , anxiety, comes today wants to have medication refills, denies any headache dizziness chest and shortness of breath, she is requesting to see a psychiatrist because of her anxiety issues, denies any SI or HI.  Past Medical History  Diagnosis Date  . Headache(784.0)   . Hypertension   . Anxiety   . Dysrhythmia     palpitations  . Anemia     Past Surgical History  Procedure Laterality Date  . Colposcopy    . Foot surgery      right 5th toe-bone removed [id number]  . Abdominal hysterectomy N/A 03/23/2013    Procedure: HYSTERECTOMY ABDOMINAL;  Surgeon: Lavonia Drafts, MD;  Location: [facility];  Service: Gynecology;  Laterality: N/A;  . Bilateral salpingectomy Bilateral 03/23/2013    Procedure: BILATERAL SALPINGECTOMY;  Surgeon: Lavonia Drafts, MD;  Location: [facility];  Service: Gynecology;  Laterality: Bilateral;      Medication List       This list is accurate as of: 06/08/13  9:34 AM.  Always use your most recent med list.               acetaminophen 500 MG tablet  Commonly known as:  TYLENOL  Take 1,000 mg by mouth every 6 (six) hours as needed (for headache).     aspirin EC 81 MG tablet  Take 81 mg by mouth daily.     DSS 100 MG Caps  Take 100 mg by mouth 2 (two) times daily.     estradiol 0.5 MG tablet  Commonly known as:  ESTRACE  Take 1 tablet (0.5 mg total) by mouth daily.     ibuprofen 800 MG tablet  Commonly known as:  ADVIL,MOTRIN  Take 1 tablet (800 mg total) by mouth every 8 (eight) hours as needed (mild pain).     lisinopril-hydrochlorothiazide 10-12.5 MG per tablet  Commonly known as:  PRINZIDE,ZESTORETIC  TAKE ONE TABLET BY MOUTH ONCE DAILY     LORazepam 0.5 MG tablet   Commonly known as:  ATIVAN  Take 1 tablet (0.5 mg total) by mouth 2 (two) times daily as needed for anxiety.     oxyCODONE-acetaminophen 5-325 MG per tablet  Commonly known as:  PERCOCET/ROXICET  Take 1-2 tablets by mouth every 4 (four) hours as needed for severe pain (moderate to severe pain (when tolerating fluids)).     ranitidine 150 MG capsule  Commonly known as:  ZANTAC  Take 1 capsule (150 mg total) by mouth daily.        Meds ordered this encounter  Medications  . lisinopril-hydrochlorothiazide (PRINZIDE,ZESTORETIC) 10-12.5 MG per tablet    Sig: TAKE ONE TABLET BY MOUTH ONCE DAILY    Dispense:  30 tablet    Refill:  3  . LORazepam (ATIVAN) 0.5 MG tablet    Sig: Take 1 tablet (0.5 mg total) by mouth 2 (two) times daily as needed for anxiety.    Dispense:  30 tablet    Refill:  0    Immunization History  Administered Date(s) Administered  . PPD Test 11/24/2012  . Tdap 11/17/2012    Family History  Problem Relation Age of Onset  . Arthritis Mother   . Stroke Mother   . Hypertension Mother   .  Arthritis Father   . Cancer Father     prostate  . Stroke Father   . Alcohol abuse Brother   . Stroke Brother   . Cancer Paternal Aunt     breast and uterine  . Colon cancer Neg Hx     History  Substance Use Topics  . Smoking status: Never Smoker   . Smokeless tobacco: Never Used  . Alcohol Use: Yes     Comment: occ    Review of Systems  As noted in HPI  Filed Vitals:   06/08/13 0913  BP: 131/85  Pulse: 82  Temp: 98.4 F (36.9 C)  Resp: 16    Physical Exam  Physical Exam  Constitutional: No distress.  HENT:  Head: Normocephalic and atraumatic.  Eyes: EOM are normal. Pupils are equal, round, and reactive to light.  Cardiovascular: Normal rate and regular rhythm.   Pulmonary/Chest: Breath sounds normal. No respiratory distress. She has no wheezes. She has no rales.  Musculoskeletal: She exhibits no edema.    CBC    Component Value Date/Time    WBC 10.1 03/24/2013 0505   RBC 3.81* 03/24/2013 0505   HGB 10.9* 03/24/2013 0505   HCT 32.7* 03/24/2013 0505   PLT 211 03/24/2013 0505   MCV 85.8 03/24/2013 0505   LYMPHSABS 1.9 02/17/2013 1646   MONOABS 0.3 02/17/2013 1646   EOSABS 0.0 02/17/2013 1646   BASOSABS 0.0 02/17/2013 1646    CMP     Component Value Date/Time   NA 138 03/24/2013 0505   K 4.0 03/24/2013 0505   CL 105 03/24/2013 0505   CO2 25 03/24/2013 0505   GLUCOSE 113* 03/24/2013 0505   BUN 12 03/24/2013 0505   CREATININE 0.95 03/24/2013 0505   CREATININE 0.95 02/18/2013 1154   CALCIUM 8.6 03/24/2013 0505   PROT 7.6 02/18/2013 1154   ALBUMIN 4.1 02/18/2013 1154   AST 19 02/18/2013 1154   ALT 13 02/18/2013 1154   ALKPHOS 50 02/18/2013 1154   BILITOT 0.7 02/18/2013 1154   GFRNONAA 68* 03/24/2013 0505   GFRAA 79* 03/24/2013 0505    Lab Results  Component Value Date/Time   CHOL 167 11/17/2012 10:01 AM    No components found with this basename: hga1c    Lab Results  Component Value Date/Time   AST 19 02/18/2013 11:54 AM    Assessment and Plan  Hypertension - Plan: advise for low salt diet continue with lisinopril-hydrochlorothiazide (PRINZIDE,ZESTORETIC) 10-12.5 MG per tablet  Anxiety state, unspecified - Plan: LORazepam (ATIVAN) 0.5 MG tablet, Ambulatory referral to Psychiatry  Special screening for malignant neoplasms, colon - Plan: Ambulatory referral to Gastroenterology  Screening - Plan: MM DIGITAL SCREENING BILATERAL   Health Maintenance -Colonoscopy: referral done -Pap Smear:following up with GYN -Mammogram:ordered   Return in about 3 months (around 09/06/2013).  Lorayne Marek, MD

## 2013-06-08 NOTE — Progress Notes (Signed)
Pt is here following up on her HTN and anxiety. Pt needs a medication refill. Pt needs a referral to psychiatrist.

## 2013-06-08 NOTE — Telephone Encounter (Signed)
Contacted pt to notify her that her medications were refilled and sent to [facility] on [address]. Left a voicemail for pt to give [place] a call back.

## 2013-06-08 NOTE — Patient Instructions (Signed)
2 Gram Low Sodium Diet A 2 gram sodium diet restricts the amount of sodium in the diet to no more than 2 g or 2000 mg daily. Limiting the amount of sodium is often used to help lower blood pressure. It is important if you have heart, liver, or kidney problems. Many foods contain sodium for flavor and sometimes as a preservative. When the amount of sodium in a diet needs to be low, it is important to know what to look for when choosing foods and drinks. The following includes some information and guidelines to help make it easier for you to adapt to a low sodium diet. QUICK TIPS  Do not add salt to food.  Avoid convenience items and fast food.  Choose unsalted snack foods.  Buy lower sodium products, often labeled as "lower sodium" or "no salt added."  Check food labels to learn how much sodium is in 1 serving.  When eating at a restaurant, ask that your food be prepared with less salt or none, if possible. READING FOOD LABELS FOR SODIUM INFORMATION The nutrition facts label is a good place to find how much sodium is in foods. Look for products with no more than 500 to 600 mg of sodium per meal and no more than 150 mg per serving. Remember that 2 g = 2000 mg. The food label may also list foods as:  Sodium-free: Less than 5 mg in a serving.  Very low sodium: 35 mg or less in a serving.  Low-sodium: 140 mg or less in a serving.  Light in sodium: 50% less sodium in a serving. For example, if a food that usually has 300 mg of sodium is changed to become light in sodium, it will have 150 mg of sodium.  Reduced sodium: 25% less sodium in a serving. For example, if a food that usually has 400 mg of sodium is changed to reduced sodium, it will have 300 mg of sodium. CHOOSING FOODS Grains  Avoid: Salted crackers and snack items. Some cereals, including instant hot cereals. Bread stuffing and biscuit mixes. Seasoned rice or pasta mixes.  Choose: Unsalted snack items. Low-sodium cereals, oats,  puffed wheat and rice, shredded wheat. English muffins and bread. Pasta. Meats  Avoid: Salted, canned, smoked, spiced, pickled meats, including fish and poultry. Bacon, ham, sausage, cold cuts, hot dogs, anchovies.  Choose: Low-sodium canned tuna and salmon. Fresh or frozen meat, poultry, and fish. Dairy  Avoid: Processed cheese and spreads. Cottage cheese. Buttermilk and condensed milk. Regular cheese.  Choose: Milk. Low-sodium cottage cheese. Yogurt. Sour cream. Low-sodium cheese. Fruits and Vegetables  Avoid: Regular canned vegetables. Regular canned tomato sauce and paste. Frozen vegetables in sauces. Olives. Pickles. Relishes. Sauerkraut.  Choose: Low-sodium canned vegetables. Low-sodium tomato sauce and paste. Frozen or fresh vegetables. Fresh and frozen fruit. Condiments  Avoid: Canned and packaged gravies. Worcestershire sauce. Tartar sauce. Barbecue sauce. Soy sauce. Steak sauce. Ketchup. Onion, garlic, and table salt. Meat flavorings and tenderizers.  Choose: Fresh and dried herbs and spices. Low-sodium varieties of mustard and ketchup. Lemon juice. Tabasco sauce. Horseradish. SAMPLE 2 GRAM SODIUM MEAL PLAN Breakfast / Sodium (mg)  1 cup low-fat milk / 143 mg  2 slices whole-wheat toast / 270 mg  1 tbs heart-healthy margarine / 153 mg  1 hard-boiled egg / 139 mg  1 small orange / 0 mg Lunch / Sodium (mg)  1 cup raw carrots / 76 mg   cup hummus / 298 mg  1 cup low-fat milk /   143 mg   cup red grapes / 2 mg  1 whole-wheat pita bread / 356 mg Dinner / Sodium (mg)  1 cup whole-wheat pasta / 2 mg  1 cup low-sodium tomato sauce / 73 mg  3 oz lean ground beef / 57 mg  1 small side salad (1 cup raw spinach leaves,  cup cucumber,  cup yellow bell pepper) with 1 tsp olive oil and 1 tsp red wine vinegar / 25 mg Snack / Sodium (mg)  1 container low-fat vanilla yogurt / 107 mg  3 graham cracker squares / 127 mg Nutrient Analysis  Calories: 2033  Protein:  77 g  Carbohydrate: 282 g  Fat: 72 g  Sodium: 1971 mg Document Released: 04/28/2005 Document Revised: 07/21/2011 Document Reviewed: 07/30/2009 Exit[employer] Patient Information 2014 [employer].  

## 2013-06-08 NOTE — Telephone Encounter (Signed)
Pt can come in today to see if she can get a refill on scripts lisinopril-hydrochlorothiazide (PRINZIDE,ZESTORETIC) 10-12.5 MG per tablet, LORazepam (ATIVAN) 0.5 MG tablet;

## 2013-06-16 ENCOUNTER — Encounter: Payer: Self-pay | Admitting: Internal Medicine

## 2013-07-06 ENCOUNTER — Telehealth: Payer: Self-pay | Admitting: Internal Medicine

## 2013-07-06 ENCOUNTER — Other Ambulatory Visit: Payer: Self-pay | Admitting: Internal Medicine

## 2013-07-06 NOTE — Telephone Encounter (Signed)
Pt called regarding a refill of her medication LORazepam (ATIVAN) 0.5 MG, please contact pt

## 2013-07-06 NOTE — Telephone Encounter (Signed)
Pt is requesting refill Ativan. Please f/u

## 2013-07-08 ENCOUNTER — Telehealth: Payer: Self-pay

## 2013-07-08 NOTE — Telephone Encounter (Signed)
Patient called requesting refill on ativan

## 2013-07-08 NOTE — Telephone Encounter (Signed)
Patient requesting refill on ativan.

## 2013-07-12 ENCOUNTER — Other Ambulatory Visit: Payer: Self-pay | Admitting: Internal Medicine

## 2013-07-12 ENCOUNTER — Telehealth: Payer: Self-pay | Admitting: Internal Medicine

## 2013-07-12 NOTE — Telephone Encounter (Signed)
Pt called regarding a refill of her medication, please contact pt

## 2013-07-14 NOTE — Telephone Encounter (Signed)
Patient requesting refill on ativan.

## 2013-07-15 NOTE — Telephone Encounter (Signed)
Patient has already  been referred to psych, help her to schedule apt with Psych, she can be given 1 refill on ativan

## 2013-07-19 ENCOUNTER — Ambulatory Visit: Payer: Medicaid Other | Attending: Internal Medicine | Admitting: Internal Medicine

## 2013-07-19 ENCOUNTER — Encounter: Payer: Self-pay | Admitting: Internal Medicine

## 2013-07-19 VITALS — BP 122/86 | HR 77 | Temp 98.9°F | Resp 16

## 2013-07-19 DIAGNOSIS — I10 Essential (primary) hypertension: Secondary | ICD-10-CM

## 2013-07-19 DIAGNOSIS — F411 Generalized anxiety disorder: Secondary | ICD-10-CM

## 2013-07-19 MED ORDER — LORAZEPAM 0.5 MG PO TABS: ORAL_TABLET | ORAL | Status: DC

## 2013-07-19 NOTE — Progress Notes (Signed)
Patient here for medication refill.

## 2013-07-19 NOTE — Progress Notes (Signed)
MRN: [id number] Name: Ann [name]  Sex: female Age: 53 y.o. DOB: 07/04/1960  Allergies: Review of patient's allergies indicates no known allergies.  Chief Complaint  Patient presents with  . Medication Refill    HPI: Patient is 53 y.o. female who has to of hypertension, anxiety symptoms comes today for followup and is requesting refill on anxiety medication, she has already been referred to psychiatry and is in the process to get scheduled denies any chest pain or shortness of breath has occasional palpitations which improves with taking lorazepam, denies any orthopnea or PND or any exertional chest pain.  Past Medical History  Diagnosis Date  . Headache(784.0)   . Hypertension   . Anxiety   . Dysrhythmia     palpitations  . Anemia     Past Surgical History  Procedure Laterality Date  . Colposcopy    . Foot surgery      right 5th toe-bone removed [id number]  . Abdominal hysterectomy N/A 03/23/2013    Procedure: HYSTERECTOMY ABDOMINAL;  Surgeon: Lavonia Drafts, MD;  Location: [facility];  Service: Gynecology;  Laterality: N/A;  . Bilateral salpingectomy Bilateral 03/23/2013    Procedure: BILATERAL SALPINGECTOMY;  Surgeon: Lavonia Drafts, MD;  Location: [facility];  Service: Gynecology;  Laterality: Bilateral;      Medication List       This list is accurate as of: 07/19/13 11:15 AM.  Always use your most recent med list.               acetaminophen 500 MG tablet  Commonly known as:  TYLENOL  Take 1,000 mg by mouth every 6 (six) hours as needed (for headache).     aspirin EC 81 MG tablet  Take 81 mg by mouth daily.     DSS 100 MG Caps  Take 100 mg by mouth 2 (two) times daily.     estradiol 0.5 MG tablet  Commonly known as:  ESTRACE  Take 1 tablet (0.5 mg total) by mouth daily.     ibuprofen 800 MG tablet  Commonly known as:  ADVIL,MOTRIN  Take 1 tablet (800 mg total) by mouth every 8 (eight) hours as needed (mild pain).     lisinopril-hydrochlorothiazide 10-12.5 MG per tablet  Commonly known as:  PRINZIDE,ZESTORETIC  TAKE ONE TABLET BY MOUTH ONCE DAILY     LORazepam 0.5 MG tablet  Commonly known as:  ATIVAN  TAKE ONE TABLET BY MOUTH TWICE DAILY AS NEEDED FOR ANXIETY     oxyCODONE-acetaminophen 5-325 MG per tablet  Commonly known as:  PERCOCET/ROXICET  Take 1-2 tablets by mouth every 4 (four) hours as needed for severe pain (moderate to severe pain (when tolerating fluids)).     ranitidine 150 MG capsule  Commonly known as:  ZANTAC  Take 1 capsule (150 mg total) by mouth daily.        Meds ordered this encounter  Medications  . LORazepam (ATIVAN) 0.5 MG tablet    Sig: TAKE ONE TABLET BY MOUTH TWICE DAILY AS NEEDED FOR ANXIETY    Dispense:  30 tablet    Refill:  0    Immunization History  Administered Date(s) Administered  . PPD Test 11/24/2012  . Tdap 11/17/2012    Family History  Problem Relation Age of Onset  . Arthritis Mother   . Stroke Mother   . Hypertension Mother   . Arthritis Father   . Cancer Father     prostate  . Stroke Father   . Alcohol  abuse Brother   . Stroke Brother   . Cancer Paternal Aunt     breast and uterine  . Colon cancer Neg Hx     History  Substance Use Topics  . Smoking status: Never Smoker   . Smokeless tobacco: Never Used  . Alcohol Use: Yes     Comment: occ    Review of Systems   As noted in HPI  Filed Vitals:   07/19/13 1105  BP: 122/86  Pulse: 77  Temp: 98.9 F (37.2 C)  Resp: 16    Physical Exam  Physical Exam  Constitutional: No distress.  Eyes: EOM are normal. Pupils are equal, round, and reactive to light.  Cardiovascular: Normal rate and regular rhythm.   Pulmonary/Chest: Breath sounds normal. No respiratory distress. She has no wheezes. She has no rales.  Musculoskeletal: She exhibits no edema.    CBC    Component Value Date/Time   WBC 10.1 03/24/2013 0505   RBC 3.81* 03/24/2013 0505   HGB 10.9* 03/24/2013 0505    HCT 32.7* 03/24/2013 0505   PLT 211 03/24/2013 0505   MCV 85.8 03/24/2013 0505   LYMPHSABS 1.9 02/17/2013 1646   MONOABS 0.3 02/17/2013 1646   EOSABS 0.0 02/17/2013 1646   BASOSABS 0.0 02/17/2013 1646    CMP     Component Value Date/Time   NA 138 03/24/2013 0505   K 4.0 03/24/2013 0505   CL 105 03/24/2013 0505   CO2 25 03/24/2013 0505   GLUCOSE 113* 03/24/2013 0505   BUN 12 03/24/2013 0505   CREATININE 0.95 03/24/2013 0505   CREATININE 0.95 02/18/2013 1154   CALCIUM 8.6 03/24/2013 0505   PROT 7.6 02/18/2013 1154   ALBUMIN 4.1 02/18/2013 1154   AST 19 02/18/2013 1154   ALT 13 02/18/2013 1154   ALKPHOS 50 02/18/2013 1154   BILITOT 0.7 02/18/2013 1154   GFRNONAA 68* 03/24/2013 0505   GFRAA 79* 03/24/2013 0505    Lab Results  Component Value Date/Time   CHOL 167 11/17/2012 10:01 AM    No components found with this basename: hga1c    Lab Results  Component Value Date/Time   AST 19 02/18/2013 11:54 AM    Assessment and Plan  Hypertension Well controlled continue with current medication, low salt diet  Anxiety state, unspecified - Plan: LORazepam (ATIVAN) 0.5 MG tablet also patient to schedule appointment with psych     Return in about 4 months (around 11/18/2013) for hypertension, anxiety.  Lorayne Marek, MD

## 2013-07-24 ENCOUNTER — Encounter: Payer: Self-pay | Admitting: *Deleted

## 2013-07-29 ENCOUNTER — Ambulatory Visit (HOSPITAL_COMMUNITY): Payer: Medicaid Other

## 2013-08-02 ENCOUNTER — Ambulatory Visit (HOSPITAL_COMMUNITY)
Admission: RE | Admit: 2013-08-02 | Discharge: 2013-08-02 | Disposition: A | Discharge status: Home / Self Care | Payer: Medicaid Other | Source: Ambulatory Visit | Attending: Internal Medicine | Admitting: Internal Medicine

## 2013-08-02 ENCOUNTER — Other Ambulatory Visit: Payer: Self-pay | Admitting: Internal Medicine

## 2013-08-02 DIAGNOSIS — Z139 Encounter for screening, unspecified: Secondary | ICD-10-CM

## 2013-08-02 DIAGNOSIS — Z1231 Encounter for screening mammogram for malignant neoplasm of breast: Secondary | ICD-10-CM

## 2013-08-09 ENCOUNTER — Ambulatory Visit (AMBULATORY_SURGERY_CENTER): Payer: Self-pay

## 2013-08-09 VITALS — Ht 62.0 in | Wt 225.0 lb

## 2013-08-09 DIAGNOSIS — Z1211 Encounter for screening for malignant neoplasm of colon: Secondary | ICD-10-CM

## 2013-08-09 MED ORDER — MOVIPREP 100 G PO SOLR: 1.0000 | Freq: Once | ORAL | Status: DC

## 2013-08-10 HISTORY — PX: COLONOSCOPY: SHX174

## 2013-08-19 ENCOUNTER — Encounter: Payer: Self-pay | Admitting: Internal Medicine

## 2013-08-19 ENCOUNTER — Ambulatory Visit: Payer: Medicaid Other | Attending: Internal Medicine | Admitting: Internal Medicine

## 2013-08-19 VITALS — BP 119/83 | HR 76 | Temp 98.4°F | Resp 16 | Ht 61.0 in | Wt 220.0 lb

## 2013-08-19 DIAGNOSIS — I10 Essential (primary) hypertension: Secondary | ICD-10-CM

## 2013-08-19 DIAGNOSIS — R109 Unspecified abdominal pain: Secondary | ICD-10-CM | POA: Insufficient documentation

## 2013-08-19 DIAGNOSIS — R252 Cramp and spasm: Secondary | ICD-10-CM | POA: Insufficient documentation

## 2013-08-19 MED ORDER — GABAPENTIN 300 MG PO CAPS: 300.0000 mg | ORAL_CAPSULE | Freq: Every day | ORAL | Status: DC

## 2013-08-19 NOTE — Progress Notes (Signed)
Pt is here following up on her HTN. Pt reports that for three weeks she has been having occasional pain around her abdomen, mid back and chest.

## 2013-08-19 NOTE — Progress Notes (Signed)
MRN: [id number] Name: Ann [name]  Sex: female Age: 53 y.o. DOB: 22-Mar-1961  Allergies: Review of patient's allergies indicates no known allergies.  Chief Complaint  Patient presents with  . Follow-up    HPI: Patient is 53 y.o. female who   Past Medical History  Diagnosis Date  . Headache(784.0)   . Hypertension   . Anxiety   . Dysrhythmia     palpitations  . Anemia     Past Surgical History  Procedure Laterality Date  . Colposcopy    . Foot surgery      right 5th toe-bone removed [id number]  . Abdominal hysterectomy N/A 03/23/2013    Procedure: HYSTERECTOMY ABDOMINAL;  Surgeon: Lavonia Drafts, MD;  Location: [facility];  Service: Gynecology;  Laterality: N/A;  . Bilateral salpingectomy Bilateral 03/23/2013    Procedure: BILATERAL SALPINGECTOMY;  Surgeon: Lavonia Drafts, MD;  Location: [facility];  Service: Gynecology;  Laterality: Bilateral;      Medication List       This list is accurate as of: 08/19/13 12:29 PM.  Always use your most recent med list.               acetaminophen 500 MG tablet  Commonly known as:  TYLENOL  Take 1,000 mg by mouth every 6 (six) hours as needed (for headache).     aspirin EC 81 MG tablet  Take 81 mg by mouth daily.     estradiol 0.5 MG tablet  Commonly known as:  ESTRACE  Take 1 tablet (0.5 mg total) by mouth daily.     gabapentin 300 MG capsule  Commonly known as:  NEURONTIN  Take 1 capsule (300 mg total) by mouth at bedtime.     ibuprofen 800 MG tablet  Commonly known as:  ADVIL,MOTRIN  Take 1 tablet (800 mg total) by mouth every 8 (eight) hours as needed (mild pain).     lisinopril-hydrochlorothiazide 10-12.5 MG per tablet  Commonly known as:  PRINZIDE,ZESTORETIC  TAKE ONE TABLET BY MOUTH ONCE DAILY     LORazepam 0.5 MG tablet  Commonly known as:  ATIVAN  TAKE ONE TABLET BY MOUTH TWICE DAILY AS NEEDED FOR ANXIETY     MOVIPREP 100 G Solr  Generic drug:  peg 3350 powder  Take 1 kit (200 g total) by  mouth once.     NON FORMULARY  1 tbsp each apple cider vinegar/honey     ranitidine 150 MG capsule  Commonly known as:  ZANTAC  Take 1 capsule (150 mg total) by mouth daily.        Meds ordered this encounter  Medications  . gabapentin (NEURONTIN) 300 MG capsule    Sig: Take 1 capsule (300 mg total) by mouth at bedtime.    Dispense:  90 capsule    Refill:  0    Immunization History  Administered Date(s) Administered  . PPD Test 11/24/2012  . Tdap 11/17/2012    Family History  Problem Relation Age of Onset  . Arthritis Mother   . Stroke Mother   . Hypertension Mother   . Arthritis Father   . Cancer Father     prostate  . Stroke Father   . Alcohol abuse Brother   . Stroke Brother   . Cancer Paternal Aunt     breast and uterine  . Colon cancer Neg Hx     History  Substance Use Topics  . Smoking status: Never Smoker   . Smokeless tobacco: Never Used  .  Alcohol Use: Yes     Comment: occ    Review of Systems   As noted in HPI  Filed Vitals:   08/19/13 1205  BP: 119/83  Pulse: 76  Temp: 98.4 F (36.9 C)  Resp: 16    Physical Exam  Physical Exam  Constitutional: No distress.  Cardiovascular: Normal rate and regular rhythm.   Pulmonary/Chest: Breath sounds normal. No respiratory distress. She has no wheezes. She has no rales.  Abdominal: Soft. There is no tenderness. There is no rebound and no guarding.  Musculoskeletal:  SLR negative     CBC    Component Value Date/Time   WBC 10.1 03/24/2013 0505   RBC 3.81* 03/24/2013 0505   HGB 10.9* 03/24/2013 0505   HCT 32.7* 03/24/2013 0505   PLT 211 03/24/2013 0505   MCV 85.8 03/24/2013 0505   LYMPHSABS 1.9 02/17/2013 1646   MONOABS 0.3 02/17/2013 1646   EOSABS 0.0 02/17/2013 1646   BASOSABS 0.0 02/17/2013 1646    CMP     Component Value Date/Time   NA 138 03/24/2013 0505   K 4.0 03/24/2013 0505   CL 105 03/24/2013 0505   CO2 25 03/24/2013 0505   GLUCOSE 113* 03/24/2013 0505   BUN 12  03/24/2013 0505   CREATININE 0.95 03/24/2013 0505   CREATININE 0.95 02/18/2013 1154   CALCIUM 8.6 03/24/2013 0505   PROT 7.6 02/18/2013 1154   ALBUMIN 4.1 02/18/2013 1154   AST 19 02/18/2013 1154   ALT 13 02/18/2013 1154   ALKPHOS 50 02/18/2013 1154   BILITOT 0.7 02/18/2013 1154   GFRNONAA 68* 03/24/2013 0505   GFRAA 79* 03/24/2013 0505    Lab Results  Component Value Date/Time   CHOL 167 11/17/2012 10:01 AM    No components found with this basename: hga1c    Lab Results  Component Value Date/Time   AST 19 02/18/2013 11:54 AM    Assessment and Plan  Hypertension  Abdominal pain - Plan: US Abdomen Complete  Leg cramp - Plan: gabapentin (NEURONTIN) 300 MG capsule    Return in about 3 months (around 11/18/2013) for hypertension.  Lorayne Marek, MD

## 2013-08-23 ENCOUNTER — Other Ambulatory Visit (INDEPENDENT_AMBULATORY_CARE_PROVIDER_SITE_OTHER): Payer: Self-pay | Admitting: Otolaryngology

## 2013-08-23 ENCOUNTER — Ambulatory Visit (AMBULATORY_SURGERY_CENTER): Payer: Medicaid Other | Admitting: Internal Medicine

## 2013-08-23 ENCOUNTER — Encounter: Payer: Self-pay | Admitting: Internal Medicine

## 2013-08-23 VITALS — BP 101/67 | HR 61 | Temp 98.2°F | Resp 18 | Ht 62.0 in | Wt 225.0 lb

## 2013-08-23 DIAGNOSIS — D126 Benign neoplasm of colon, unspecified: Secondary | ICD-10-CM

## 2013-08-23 DIAGNOSIS — Z1211 Encounter for screening for malignant neoplasm of colon: Secondary | ICD-10-CM

## 2013-08-23 MED ORDER — SODIUM CHLORIDE 0.9 % IV SOLN: 500.0000 mL | INTRAVENOUS | Status: DC

## 2013-08-23 NOTE — Progress Notes (Signed)
Called to room to assist during endoscopic procedure.  Patient ID and intended procedure confirmed with present staff. Received instructions for my participation in the procedure from the performing physician.  

## 2013-08-23 NOTE — Patient Instructions (Addendum)
Hold Aspirin, aspirinn products and Antiinflammatories (Motrin, Ibuprofen,Naprosyn, Aleve, etc.)  for one week, August 30, 2013.   YOU HAD AN ENDOSCOPIC PROCEDURE TODAY AT [facility]: Refer to the procedure report that was given to you for any specific questions about what was found during the examination.  If the procedure report does not answer your questions, please call your gastroenterologist to clarify.  If you requested that your care partner not be given the details of your procedure findings, then the procedure report has been included in a sealed envelope for you to review at your convenience later.  YOU SHOULD EXPECT: Some feelings of bloating in the abdomen. Passage of more gas than usual.  Walking can help get rid of the air that was put into your GI tract during the procedure and reduce the bloating. If you had a lower endoscopy (such as a colonoscopy or flexible sigmoidoscopy) you may notice spotting of blood in your stool or on the toilet paper. If you underwent a bowel prep for your procedure, then you may not have a normal bowel movement for a few days.  DIET: Your first meal following the procedure should be a light meal and then it is ok to progress to your normal diet.  A half-sandwich or bowl of soup is an example of a good first meal.  Heavy or fried foods are harder to digest and may make you feel nauseous or bloated.  Likewise meals heavy in dairy and vegetables can cause extra gas to form and this can also increase the bloating.  Drink plenty of fluids but you should avoid alcoholic beverages for 24 hours.  ACTIVITY: Your care partner should take you home directly after the procedure.  You should plan to take it easy, moving slowly for the rest of the day.  You can resume normal activity the day after the procedure however you should NOT DRIVE or use heavy machinery for 24 hours (because of the sedation medicines used during the test).    SYMPTOMS TO REPORT  IMMEDIATELY: A gastroenterologist can be reached at any hour.  During normal business hours, 8:30 AM to 5:00 PM Monday through Friday, call [phone].  After hours and on weekends, please call the GI answering service at [phone] who will take a message and have the physician on call contact you.   Following lower endoscopy (colonoscopy or flexible sigmoidoscopy):  Excessive amounts of blood in the stool  Significant tenderness or worsening of abdominal pains  Swelling of the abdomen that is new, acute  Fever of 100F or higher FOLLOW UP: If any biopsies were taken you will be contacted by phone or by letter within the next 1-3 weeks.  Call your gastroenterologist if you have not heard about the biopsies in 3 weeks.  Our staff will call the home number listed on your records the next business day following your procedure to check on you and address any questions or concerns that you may have at that time regarding the information given to you following your procedure. This is a courtesy call and so if there is no answer at the home number and we have not heard from you through the emergency physician on call, we will assume that you have returned to your regular daily activities without incident.  SIGNATURES/CONFIDENTIALITY: You and/or your care partner have signed paperwork which will be entered into your electronic medical record.  These signatures attest to the fact that that the information above on  your After Visit Summary has been reviewed and is understood.  Full responsibility of the confidentiality of this discharge information lies with you and/or your care-partner. 

## 2013-08-23 NOTE — Op Note (Signed)
[facility]  [address] [place], [id number]   COLONOSCOPY PROCEDURE REPORT  PATIENT: Ann, [name]  MR#: [id number] BIRTHDATE: Jul 22, 1960 , 52  yrs. old GENDER: Female ENDOSCOPIST: Jerene Bears, MD REFERRED JK:[id number] Doreene Burke, M.D. PROCEDURE DATE:  08/23/2013 PROCEDURE:   Colonoscopy with snare polypectomy and Colonoscopy with cold biopsy polypectomy First Screening Colonoscopy - Avg.  risk and is 50 yrs.  old or older Yes.  Prior Negative Screening - Now for repeat screening. N/A  History of Adenoma - Now for follow-up colonoscopy & has been > or = to 3 yrs.  N/A  Polyps Removed Today? Yes. ASA CLASS:   Class II INDICATIONS:average risk screening and first colonoscopy. MEDICATIONS: MAC sedation, administered by CRNA and propofol (Diprivan) 250mg  IV  DESCRIPTION OF PROCEDURE:   After the risks benefits and alternatives of the procedure were thoroughly explained, informed consent was obtained.  A digital rectal exam revealed no rectal mass.   The LB [id number]  endoscope was introduced through the anus and advanced to the cecum, which was identified by both the appendix and ileocecal valve. No adverse events experienced. The quality of the prep was good, using MoviPrep  The instrument was then slowly withdrawn as the colon was fully examined.   COLON FINDINGS: A sessile polyp measuring 7 mm in size was found in the ascending colon.  A polypectomy was performed with a cold snare.  The resection was complete and the polyp tissue was completely retrieved.   Two sessile polyps measuring 2-3 mm in size were found in the ascending colon and at the hepatic flexure. Polypectomy was performed with cold forceps.  All resections were complete and all polyp tissue was completely retrieved.   The colon mucosa was otherwise normal.  Retroflexed views revealed no abnormalities. The time to cecum=3 minutes 23 seconds.  Withdrawal time=14 minutes 29 seconds.  The  scope was withdrawn and the procedure completed. COMPLICATIONS: There were no complications.  ENDOSCOPIC IMPRESSION: 1.   Sessile polyp measuring 7 mm in size was found in the ascending colon; polypectomy was performed with a cold snare 2.   Two sessile polyps measuring 2-3 mm in size were found in the ascending colon and at the hepatic flexure; Polypectomy was performed with cold forceps 3.   The colon mucosa was otherwise normal  RECOMMENDATIONS: 1.  Hold aspirin, aspirin products, and anti-inflammatory medication for 1 week. 2.  Await pathology results 3.  Timing of repeat colonoscopy will be determined by pathology findings. 4.  You will receive a letter within 1-2 weeks with the results of your biopsy as well as final recommendations.  Please call my office if you have not received a letter after 3 weeks.  eSigned:  Jerene Bears, MD 08/23/2013 9:44 AM      cc: The Patient and Angelica Chessman MD

## 2013-08-23 NOTE — Progress Notes (Signed)
A/ox3 pleased with MAC, report to Karen RN 

## 2013-08-24 ENCOUNTER — Telehealth: Payer: Self-pay

## 2013-08-24 ENCOUNTER — Ambulatory Visit (HOSPITAL_COMMUNITY)
Admission: RE | Admit: 2013-08-24 | Discharge: 2013-08-24 | Disposition: A | Discharge status: Home / Self Care | Payer: Medicaid Other | Source: Ambulatory Visit | Attending: Internal Medicine | Admitting: Internal Medicine

## 2013-08-24 DIAGNOSIS — R109 Unspecified abdominal pain: Secondary | ICD-10-CM

## 2013-08-24 NOTE — Telephone Encounter (Signed)
  Follow up Call-  Call back number 08/23/2013  Post procedure Call Back phone  # [phone]  Permission to leave phone message No     Patient questions:  Do you have a fever, pain , or abdominal swelling? no Pain Score  0 *  Have you tolerated food without any problems? yes  Have you been able to return to your normal activities? yes  Do you have any questions about your discharge instructions: Diet   no Medications  no Follow up visit  no  Do you have questions or concerns about your Care? no  Actions: * If pain score is 4 or above: No action needed, pain <4.

## 2013-08-25 ENCOUNTER — Telehealth: Payer: Self-pay

## 2013-08-25 NOTE — Telephone Encounter (Signed)
Message copied by Dorothe Pea on Thu Aug 25, 2013 10:40 AM ------      Message from: Lorayne Marek      Created: Wed Aug 24, 2013  2:37 PM       Call and let the patient know that her abdomen ultrasound is normal. ------

## 2013-08-25 NOTE — Telephone Encounter (Signed)
Patient is aware of her [place] results

## 2013-08-29 ENCOUNTER — Encounter: Payer: Self-pay | Admitting: Internal Medicine

## 2013-09-12 ENCOUNTER — Ambulatory Visit: Payer: Medicaid Other | Admitting: Internal Medicine

## 2013-09-14 ENCOUNTER — Ambulatory Visit: Payer: Medicaid Other | Attending: Internal Medicine | Admitting: Internal Medicine

## 2013-09-14 ENCOUNTER — Encounter: Payer: Self-pay | Admitting: Internal Medicine

## 2013-09-14 VITALS — BP 118/83 | HR 86 | Temp 99.1°F | Resp 16 | Wt 219.6 lb

## 2013-09-14 DIAGNOSIS — Z79899 Other long term (current) drug therapy: Secondary | ICD-10-CM | POA: Insufficient documentation

## 2013-09-14 DIAGNOSIS — F411 Generalized anxiety disorder: Secondary | ICD-10-CM

## 2013-09-14 DIAGNOSIS — R519 Headache, unspecified: Secondary | ICD-10-CM

## 2013-09-14 DIAGNOSIS — R51 Headache: Secondary | ICD-10-CM

## 2013-09-14 DIAGNOSIS — H5710 Ocular pain, unspecified eye: Secondary | ICD-10-CM | POA: Insufficient documentation

## 2013-09-14 DIAGNOSIS — L259 Unspecified contact dermatitis, unspecified cause: Secondary | ICD-10-CM

## 2013-09-14 DIAGNOSIS — L309 Dermatitis, unspecified: Secondary | ICD-10-CM

## 2013-09-14 LAB — SEDIMENTATION RATE: SED RATE: 17 mm/h (ref 0–22)

## 2013-09-14 LAB — C-REACTIVE PROTEIN: CRP: 0.7 mg/dL — AB (ref <0.60)

## 2013-09-14 MED ORDER — LORAZEPAM 0.5 MG PO TABS: ORAL_TABLET | ORAL | Status: DC

## 2013-09-14 MED ORDER — HYDROCORTISONE 2.5 % EX CREA: TOPICAL_CREAM | Freq: Two times a day (BID) | CUTANEOUS | Status: DC

## 2013-09-14 MED ORDER — BUTALBITAL-APAP-CAFFEINE 50-325-40 MG PO TABS: 1.0000 | ORAL_TABLET | Freq: Four times a day (QID) | ORAL | Status: DC | PRN

## 2013-09-14 NOTE — Progress Notes (Signed)
Patient complains of having some "weird" feeling around her left ear Feeling radiates down her left side of neck and left shoulder This started about a week ago Has some eye twitching to her left eye as well as dull pain behind the left eye Also stated while  driving here experienced a strange aura

## 2013-09-14 NOTE — Progress Notes (Signed)
MRN: [id number] Name: Ann [name]  Sex: female Age: 53 y.o. DOB: 12/12/1960  Allergies: Review of patient's allergies indicates no known allergies.  Chief Complaint  Patient presents with  . Eye Pain    HPI: Patient is 53 y.o. female who has history of chronic headaches, she reported to have worsening symptoms for the last one week sometimes is pounding sensation associated with aura, she reported to have history of migraine headaches in the past and was taking some medication, she noticed stye in her left eye a few days ago for which is now resolved currently she denies any visual disturbance, but has some tenderness/pain in the left temporal area patient also is low-grade fever. Patient denies any numbness weakness, Patient is also requesting refill on her anxiety medication, also reported to have eczematous rash on her forearms she has tried over-the-counter medication without much improvement. Past Medical History  Diagnosis Date  . Headache(784.0)   . Hypertension   . Anxiety   . Dysrhythmia     palpitations  . Anemia   . GERD (gastroesophageal reflux disease)     Past Surgical History  Procedure Laterality Date  . Colposcopy    . Foot surgery      right 5th toe-bone removed [id number]  . Abdominal hysterectomy N/A 03/23/2013    Procedure: HYSTERECTOMY ABDOMINAL;  Surgeon: Lavonia Drafts, MD;  Location: [facility];  Service: Gynecology;  Laterality: N/A;  . Bilateral salpingectomy Bilateral 03/23/2013    Procedure: BILATERAL SALPINGECTOMY;  Surgeon: Lavonia Drafts, MD;  Location: [facility];  Service: Gynecology;  Laterality: Bilateral;      Medication List       This list is accurate as of: 09/14/13 11:26 AM.  Always use your most recent med list.               acetaminophen 500 MG tablet  Commonly known as:  TYLENOL  Take 1,000 mg by mouth every 6 (six) hours as needed (for headache).     aspirin EC 81 MG tablet  Take 81 mg by mouth daily.     butalbital-acetaminophen-caffeine 50-325-40 MG per tablet  Commonly known as:  ESGIC  Take 1 tablet by mouth every 6 (six) hours as needed for headache.     estradiol 0.5 MG tablet  Commonly known as:  ESTRACE  Take 1 tablet (0.5 mg total) by mouth daily.     gabapentin 300 MG capsule  Commonly known as:  NEURONTIN  Take 1 capsule (300 mg total) by mouth at bedtime.     hydrocortisone 2.5 % cream  Apply topically 2 (two) times daily.     ibuprofen 800 MG tablet  Commonly known as:  ADVIL,MOTRIN  Take 1 tablet (800 mg total) by mouth every 8 (eight) hours as needed (mild pain).     lisinopril-hydrochlorothiazide 10-12.5 MG per tablet  Commonly known as:  PRINZIDE,ZESTORETIC  TAKE ONE TABLET BY MOUTH ONCE DAILY     LORazepam 0.5 MG tablet  Commonly known as:  ATIVAN  TAKE ONE TABLET BY MOUTH TWICE DAILY AS NEEDED FOR ANXIETY     NON FORMULARY  1 tbsp each apple cider vinegar/honey     ranitidine 150 MG capsule  Commonly known as:  ZANTAC  Take 1 capsule (150 mg total) by mouth daily.        Meds ordered this encounter  Medications  . butalbital-acetaminophen-caffeine (ESGIC) 50-325-40 MG per tablet    Sig: Take 1 tablet by mouth every 6 (six)  hours as needed for headache.    Dispense:  30 tablet    Refill:  0  . LORazepam (ATIVAN) 0.5 MG tablet    Sig: TAKE ONE TABLET BY MOUTH TWICE DAILY AS NEEDED FOR ANXIETY    Dispense:  30 tablet    Refill:  0  . hydrocortisone 2.5 % cream    Sig: Apply topically 2 (two) times daily.    Dispense:  30 g    Refill:  0    Immunization History  Administered Date(s) Administered  . PPD Test 11/24/2012  . Tdap 11/17/2012    Family History  Problem Relation Age of Onset  . Arthritis Mother   . Stroke Mother   . Hypertension Mother   . Arthritis Father   . Cancer Father     prostate  . Stroke Father   . Alcohol abuse Brother   . Stroke Brother   . Cancer Paternal Aunt     breast and uterine  . Colon cancer Neg Hx      History  Substance Use Topics  . Smoking status: Never Smoker   . Smokeless tobacco: Never Used  . Alcohol Use: Yes     Comment: occ    Review of Systems   As noted in HPI  Filed Vitals:   09/14/13 1049  BP: 118/83  Pulse: 86  Temp: 99.1 F (37.3 C)  Resp: 16    Physical Exam  Physical Exam  Constitutional: She is oriented to person, place, and time. No distress.  HENT:  Both TMs visualized not congested,  Tenderness on left temporal  Area   Eyes: EOM are normal. Pupils are equal, round, and reactive to light.  Cardiovascular: Normal rate and regular rhythm.   Pulmonary/Chest: Breath sounds normal. No respiratory distress. She has no wheezes. She has no rales.  Musculoskeletal: She exhibits no edema.  Equal strength all extremities   Neurological: She is alert and oriented to person, place, and time. She has normal reflexes. No cranial nerve deficit. Coordination normal.  Skin:  Eczematous rash on forearms     CBC    Component Value Date/Time   WBC 10.1 03/24/2013 0505   RBC 3.81* 03/24/2013 0505   HGB 10.9* 03/24/2013 0505   HCT 32.7* 03/24/2013 0505   PLT 211 03/24/2013 0505   MCV 85.8 03/24/2013 0505   LYMPHSABS 1.9 02/17/2013 1646   MONOABS 0.3 02/17/2013 1646   EOSABS 0.0 02/17/2013 1646   BASOSABS 0.0 02/17/2013 1646    CMP     Component Value Date/Time   NA 138 03/24/2013 0505   K 4.0 03/24/2013 0505   CL 105 03/24/2013 0505   CO2 25 03/24/2013 0505   GLUCOSE 113* 03/24/2013 0505   BUN 12 03/24/2013 0505   CREATININE 0.95 03/24/2013 0505   CREATININE 0.95 02/18/2013 1154   CALCIUM 8.6 03/24/2013 0505   PROT 7.6 02/18/2013 1154   ALBUMIN 4.1 02/18/2013 1154   AST 19 02/18/2013 1154   ALT 13 02/18/2013 1154   ALKPHOS 50 02/18/2013 1154   BILITOT 0.7 02/18/2013 1154   GFRNONAA 68* 03/24/2013 0505   GFRAA 79* 03/24/2013 0505    Lab Results  Component Value Date/Time   CHOL 167 11/17/2012 10:01 AM    No components found with this  basename: hga1c    Lab Results  Component Value Date/Time   AST 19 02/18/2013 11:54 AM    Assessment and Plan  Headache(784.0) - Plan: ? Migraine headache, I prescribed butalbital-acetaminophen-caffeine (ESGIC)  50-325-40 MG per tablet when necessary for pain, also ordered Sedimentation Rate, CT Head Wo Contrast to rule out any intracranial abnormality.  Temporal pain - Plan: Will check Sedimentation Rate, C-reactive to rule out possible temporal arteritis  Anxiety state, unspecified - Plan: LORazepam (ATIVAN) 0.5 MG tablet  Eczema - Plan: hydrocortisone 2.5 % cream   I Have advised patient to be admitted medical attention if she has worsening of the symptoms or develops new symptoms, she understands verbalized instructions.  Patient will follow up as scheduled.  Lorayne Marek, MD

## 2013-09-16 ENCOUNTER — Ambulatory Visit (HOSPITAL_COMMUNITY)
Admission: RE | Admit: 2013-09-16 | Discharge: 2013-09-16 | Disposition: A | Discharge status: Home / Self Care | Payer: Medicaid Other | Source: Ambulatory Visit | Attending: Internal Medicine | Admitting: Internal Medicine

## 2013-09-16 DIAGNOSIS — R51 Headache: Secondary | ICD-10-CM | POA: Insufficient documentation

## 2013-10-29 ENCOUNTER — Other Ambulatory Visit: Payer: Self-pay | Admitting: Internal Medicine

## 2013-10-29 DIAGNOSIS — I10 Essential (primary) hypertension: Secondary | ICD-10-CM

## 2013-10-31 ENCOUNTER — Telehealth: Payer: Self-pay | Admitting: General Practice

## 2013-10-31 NOTE — Telephone Encounter (Signed)
Patient called and left message stating her [facility] pharmacy on [address] needs authorization for a refill. Called patient and message stated the [facility] customer is unavailable right now, either because the phone is off or they are outside of the service area- unable to leave message

## 2013-11-01 NOTE — Telephone Encounter (Signed)
Called patient, no answer- left message that we are trying to return your phone call, please call us back at the clinics

## 2013-11-02 ENCOUNTER — Other Ambulatory Visit: Payer: Self-pay | Admitting: Obstetrics & Gynecology

## 2013-11-02 DIAGNOSIS — R232 Flushing: Secondary | ICD-10-CM

## 2013-11-02 MED ORDER — ESTRADIOL 0.5 MG PO TABS: 0.5000 mg | ORAL_TABLET | Freq: Every day | ORAL | Status: DC

## 2013-11-02 NOTE — Telephone Encounter (Signed)
Dr. Ihor Dow sent refill to pharmacy. Patient informed. No further questions or concerns.

## 2013-11-02 NOTE — Telephone Encounter (Signed)
Patient called stating she is requesting refill of estradiol. Would like it sent to [facility] on [facility] [address] [facility] [address] and requests a call back.

## 2013-11-02 NOTE — Telephone Encounter (Signed)
Called patient and informed her message has been sent to provider so that she may verify refill-- awaiting response.  Patient verbalized understanding and gratitude. Has scheduled an appointment to be seen for 12/01/13 for follow up.

## 2013-11-16 ENCOUNTER — Telehealth: Payer: Self-pay | Admitting: *Deleted

## 2013-11-16 NOTE — Telephone Encounter (Signed)
Contacted patient and confirmed medication was filled and picked up at [facility], request was sent from [facility] for refill.

## 2013-11-18 ENCOUNTER — Encounter: Payer: Self-pay | Admitting: Internal Medicine

## 2013-11-18 ENCOUNTER — Ambulatory Visit: Payer: Medicaid Other | Attending: Internal Medicine | Admitting: Internal Medicine

## 2013-11-18 VITALS — BP 113/77 | HR 87 | Temp 97.8°F | Resp 16 | Wt 211.4 lb

## 2013-11-18 DIAGNOSIS — F411 Generalized anxiety disorder: Secondary | ICD-10-CM | POA: Insufficient documentation

## 2013-11-18 DIAGNOSIS — K219 Gastro-esophageal reflux disease without esophagitis: Secondary | ICD-10-CM | POA: Insufficient documentation

## 2013-11-18 DIAGNOSIS — Z79899 Other long term (current) drug therapy: Secondary | ICD-10-CM | POA: Insufficient documentation

## 2013-11-18 DIAGNOSIS — I10 Essential (primary) hypertension: Secondary | ICD-10-CM | POA: Insufficient documentation

## 2013-11-18 DIAGNOSIS — D649 Anemia, unspecified: Secondary | ICD-10-CM | POA: Diagnosis not present

## 2013-11-18 MED ORDER — LORAZEPAM 0.5 MG PO TABS: ORAL_TABLET | ORAL | Status: DC

## 2013-11-18 MED ORDER — SERTRALINE HCL 25 MG PO TABS: 25.0000 mg | ORAL_TABLET | Freq: Every day | ORAL | Status: DC

## 2013-11-18 MED ORDER — LISINOPRIL-HYDROCHLOROTHIAZIDE 10-12.5 MG PO TABS: ORAL_TABLET | ORAL | Status: DC

## 2013-11-18 NOTE — Progress Notes (Signed)
Patient here for follow up on her HTN And needs medication refills

## 2013-11-18 NOTE — Progress Notes (Signed)
MRN: [id number] Name: Ann [name]  Sex: female Age: 53 y.o. DOB: August 20, 1960  Allergies: Review of patient's allergies indicates no known allergies.  Chief Complaint  Patient presents with  . Medication Refill    HPI: Patient is 53 y.o. female who history of hypertension, anxiety comes today for followup requesting refill on her medications denies any headache dizziness chest and shortness of breath, still has more often anxiety symptoms despite of using lorazepam when necessary, discussed about trial of SSRI patient is agreeable to try, she denies any SI or HI.  Past Medical History  Diagnosis Date  . Headache(784.0)   . Hypertension   . Anxiety   . Dysrhythmia     palpitations  . Anemia   . GERD (gastroesophageal reflux disease)     Past Surgical History  Procedure Laterality Date  . Colposcopy    . Foot surgery      right 5th toe-bone removed [id number]  . Abdominal hysterectomy N/A 03/23/2013    Procedure: HYSTERECTOMY ABDOMINAL;  Surgeon: Lavonia Drafts, MD;  Location: [facility];  Service: Gynecology;  Laterality: N/A;  . Bilateral salpingectomy Bilateral 03/23/2013    Procedure: BILATERAL SALPINGECTOMY;  Surgeon: Lavonia Drafts, MD;  Location: [facility];  Service: Gynecology;  Laterality: Bilateral;      Medication List       This list is accurate as of: 11/18/13  9:34 AM.  Always use your most recent med list.               acetaminophen 500 MG tablet  Commonly known as:  TYLENOL  Take 1,000 mg by mouth every 6 (six) hours as needed (for headache).     aspirin EC 81 MG tablet  Take 81 mg by mouth daily.     butalbital-acetaminophen-caffeine 50-325-40 MG per tablet  Commonly known as:  ESGIC  Take 1 tablet by mouth every 6 (six) hours as needed for headache.     estradiol 0.5 MG tablet  Commonly known as:  ESTRACE  Take 1 tablet (0.5 mg total) by mouth daily.     gabapentin 300 MG capsule  Commonly known as:  NEURONTIN  Take 1 capsule  (300 mg total) by mouth at bedtime.     hydrocortisone 2.5 % cream  Apply topically 2 (two) times daily.     ibuprofen 800 MG tablet  Commonly known as:  ADVIL,MOTRIN  Take 1 tablet (800 mg total) by mouth every 8 (eight) hours as needed (mild pain).     lisinopril-hydrochlorothiazide 10-12.5 MG per tablet  Commonly known as:  PRINZIDE,ZESTORETIC  TAKE ONE TABLET BY MOUTH ONCE DAILY     LORazepam 0.5 MG tablet  Commonly known as:  ATIVAN  TAKE ONE TABLET BY MOUTH TWICE DAILY AS NEEDED FOR ANXIETY     NON FORMULARY  1 tbsp each apple cider vinegar/honey     ranitidine 150 MG capsule  Commonly known as:  ZANTAC  Take 1 capsule (150 mg total) by mouth daily.     sertraline 25 MG tablet  Commonly known as:  ZOLOFT  Take 1 tablet (25 mg total) by mouth daily.        Meds ordered this encounter  Medications  . lisinopril-hydrochlorothiazide (PRINZIDE,ZESTORETIC) 10-12.5 MG per tablet    Sig: TAKE ONE TABLET BY MOUTH ONCE DAILY    Dispense:  30 tablet    Refill:  2  . LORazepam (ATIVAN) 0.5 MG tablet    Sig: TAKE ONE TABLET BY MOUTH TWICE  DAILY AS NEEDED FOR ANXIETY    Dispense:  30 tablet    Refill:  0  . sertraline (ZOLOFT) 25 MG tablet    Sig: Take 1 tablet (25 mg total) by mouth daily.    Dispense:  30 tablet    Refill:  3    Immunization History  Administered Date(s) Administered  . PPD Test 11/24/2012  . Tdap 11/17/2012    Family History  Problem Relation Age of Onset  . Arthritis Mother   . Stroke Mother   . Hypertension Mother   . Arthritis Father   . Cancer Father     prostate  . Stroke Father   . Alcohol abuse Brother   . Stroke Brother   . Cancer Paternal Aunt     breast and uterine  . Colon cancer Neg Hx     History  Substance Use Topics  . Smoking status: Never Smoker   . Smokeless tobacco: Never Used  . Alcohol Use: Yes     Comment: occ    Review of Systems   As noted in HPI  Filed Vitals:   11/18/13 0920  BP: 113/77  Pulse:  87  Temp: 97.8 F (36.6 C)  Resp: 16    Physical Exam  Physical Exam  Constitutional: No distress.  Eyes: EOM are normal. Pupils are equal, round, and reactive to light.  Cardiovascular: Normal rate and regular rhythm.   Pulmonary/Chest: Breath sounds normal. No respiratory distress. She has no wheezes. She has no rales.  Musculoskeletal: She exhibits no edema.    CBC    Component Value Date/Time   WBC 10.1 03/24/2013 0505   RBC 3.81* 03/24/2013 0505   HGB 10.9* 03/24/2013 0505   HCT 32.7* 03/24/2013 0505   PLT 211 03/24/2013 0505   MCV 85.8 03/24/2013 0505   LYMPHSABS 1.9 02/17/2013 1646   MONOABS 0.3 02/17/2013 1646   EOSABS 0.0 02/17/2013 1646   BASOSABS 0.0 02/17/2013 1646    CMP     Component Value Date/Time   NA 138 03/24/2013 0505   K 4.0 03/24/2013 0505   CL 105 03/24/2013 0505   CO2 25 03/24/2013 0505   GLUCOSE 113* 03/24/2013 0505   BUN 12 03/24/2013 0505   CREATININE 0.95 03/24/2013 0505   CREATININE 0.95 02/18/2013 1154   CALCIUM 8.6 03/24/2013 0505   PROT 7.6 02/18/2013 1154   ALBUMIN 4.1 02/18/2013 1154   AST 19 02/18/2013 1154   ALT 13 02/18/2013 1154   ALKPHOS 50 02/18/2013 1154   BILITOT 0.7 02/18/2013 1154   GFRNONAA 68* 03/24/2013 0505   GFRAA 79* 03/24/2013 0505    Lab Results  Component Value Date/Time   CHOL 167 11/17/2012 10:01 AM    No components found with this basename: hga1c    Lab Results  Component Value Date/Time   AST 19 02/18/2013 11:54 AM    Assessment and Plan  Essential hypertension - Plan: Blood pressure is well controlled continue with her lisinopril-hydrochlorothiazide (PRINZIDE,ZESTORETIC) 10-12.5 MG per tablet, will check blood chemistry COMPLETE METABOLIC PANEL WITH GFR  Anxiety state, unspecified - Plan: LORazepam (ATIVAN) 0.5 MG tablet when necessary, I have started patient on sertraline (ZOLOFT) 25 MG tablet   Health Maintenance -Colonoscopy: uptodate   -Mammogram: uptodate  - Return in about 3 months  (around 02/18/2014) for hypertension, anxiety.  Lorayne Marek, MD

## 2013-11-19 LAB — COMPLETE METABOLIC PANEL WITH GFR
ALT: 10 U/L (ref 0–35)
AST: 17 U/L (ref 0–37)
Albumin: 4.2 g/dL (ref 3.5–5.2)
Alkaline Phosphatase: 49 U/L (ref 39–117)
BILIRUBIN TOTAL: 0.6 mg/dL (ref 0.2–1.2)
BUN: 13 mg/dL (ref 6–23)
CALCIUM: 9.4 mg/dL (ref 8.4–10.5)
CO2: 28 mEq/L (ref 19–32)
CREATININE: 0.96 mg/dL (ref 0.50–1.10)
Chloride: 103 mEq/L (ref 96–112)
GFR, EST AFRICAN AMERICAN: 79 mL/min
GFR, Est Non African American: 69 mL/min
Glucose, Bld: 93 mg/dL (ref 70–99)
Potassium: 4 mEq/L (ref 3.5–5.3)
Sodium: 140 mEq/L (ref 135–145)
Total Protein: 7.4 g/dL (ref 6.0–8.3)

## 2013-11-21 ENCOUNTER — Telehealth: Payer: Self-pay

## 2013-11-21 NOTE — Telephone Encounter (Signed)
Message copied by Dorothe Pea on Mon Nov 21, 2013 12:52 PM ------      Message from: Lorayne Marek      Created: Mon Nov 21, 2013 11:35 AM       Call and let the Patient know that blood work is normal.       ------

## 2013-11-21 NOTE — Telephone Encounter (Signed)
Patient is aware of her lab results 

## 2013-12-01 ENCOUNTER — Ambulatory Visit (INDEPENDENT_AMBULATORY_CARE_PROVIDER_SITE_OTHER): Payer: Medicaid Other | Admitting: Obstetrics & Gynecology

## 2013-12-01 ENCOUNTER — Encounter: Payer: Self-pay | Admitting: Obstetrics & Gynecology

## 2013-12-01 VITALS — BP 107/61 | HR 79 | Temp 98.3°F | Ht 61.0 in | Wt 212.5 lb

## 2013-12-01 DIAGNOSIS — N951 Menopausal and female climacteric states: Secondary | ICD-10-CM

## 2013-12-01 DIAGNOSIS — R232 Flushing: Secondary | ICD-10-CM

## 2013-12-01 DIAGNOSIS — Z780 Asymptomatic menopausal state: Secondary | ICD-10-CM

## 2013-12-01 MED ORDER — ESTRADIOL 1 MG PO TABS: 1.0000 mg | ORAL_TABLET | Freq: Every day | ORAL | Status: DC

## 2013-12-01 NOTE — Patient Instructions (Signed)
Menopause Menopause is the normal time of life when menstrual periods stop completely. Menopause is complete when you have missed 12 consecutive menstrual periods. It usually occurs between the ages of 48 years and 55 years. Very rarely does a woman develop menopause before the age of 40 years. At menopause, your ovaries stop producing the female hormones estrogen and progesterone. This can cause undesirable symptoms and also affect your health. Sometimes the symptoms may occur 4-5 years before the menopause begins. There is no relationship between menopause and:  Oral contraceptives.  Number of children you had.  Race.  The age your menstrual periods started (menarche). Heavy smokers and very thin women may develop menopause earlier in life. CAUSES  The ovaries stop producing the female hormones estrogen and progesterone.  Other causes include:  Surgery to remove both ovaries.  The ovaries stop functioning for no known reason.  Tumors of the pituitary gland in the brain.  Medical disease that affects the ovaries and hormone production.  Radiation treatment to the abdomen or pelvis.  Chemotherapy that affects the ovaries. SYMPTOMS   Hot flashes.  Night sweats.  Decrease in sex drive.  Vaginal dryness and thinning of the vagina causing painful intercourse.  Dryness of the skin and developing wrinkles.  Headaches.  Tiredness.  Irritability.  Memory problems.  Weight gain.  Bladder infections.  Hair growth of the face and chest.  Infertility. More serious symptoms include:  Loss of bone (osteoporosis) causing breaks (fractures).  Depression.  Hardening and narrowing of the arteries (atherosclerosis) causing heart attacks and strokes. DIAGNOSIS   When the menstrual periods have stopped for 12 straight months.  Physical exam.  Hormone studies of the blood. TREATMENT  There are many treatment choices and nearly as many questions about them. The  decisions to treat or not to treat menopausal changes is an individual choice made with your health care provider. Your health care provider can discuss the treatments with you. Together, you can decide which treatment will work best for you. Your treatment choices may include:   Hormone therapy (estrogen and progesterone).  Non-hormonal medicines.  Treating the individual symptoms with medicine (for example antidepressants for depression).  Herbal medicines that may help specific symptoms.  Counseling by a psychiatrist or psychologist.  Group therapy.  Lifestyle changes including:  Eating healthy.  Regular exercise.  Limiting caffeine and alcohol.  Stress management and meditation.  No treatment. HOME CARE INSTRUCTIONS   Take the medicine your health care provider gives you as directed.  Get plenty of sleep and rest.  Exercise regularly.  Eat a diet that contains calcium (good for the bones) and soy products (acts like estrogen hormone).  Avoid alcoholic beverages.  Do not smoke.  If you have hot flashes, dress in layers.  Take supplements, calcium, and vitamin D to strengthen bones.  You can use over-the-counter lubricants or moisturizers for vaginal dryness.  Group therapy is sometimes very helpful.  Acupuncture may be helpful in some cases. SEEK MEDICAL CARE IF:   You are not sure you are in menopause.  You are having menopausal symptoms and need advice and treatment.  You are still having menstrual periods after age 55 years.  You have pain with intercourse.  Menopause is complete (no menstrual period for 12 months) and you develop vaginal bleeding.  You need a referral to a specialist (gynecologist, psychiatrist, or psychologist) for treatment. SEEK IMMEDIATE MEDICAL CARE IF:   You have severe depression.  You have excessive vaginal bleeding.    You fell and think you have a broken bone.  You have pain when you urinate.  You develop leg or  chest pain.  You have a fast pounding heart beat (palpitations).  You have severe headaches.  You develop vision problems.  You feel a lump in your breast.  You have abdominal pain or severe indigestion. Document Released: 07/19/2003 Document Revised: 12/29/2012 Document Reviewed: 11/25/2012 Exit[employer] Patient Information 2015 [employer]. This information is not intended to replace advice given to you by your health care provider. Make sure you discuss any questions you have with your health care provider.  

## 2013-12-01 NOTE — Progress Notes (Signed)
Subjective:     Patient ID: Ann [name], female   DOB: 1960-09-12, 53 y.o.   MRN: [id number]  HPI Pt c/o severe vasomotor sx. She reports that she has been worried about what she is reading at [facility] but, she feels miserable.  She c/o mood changes and sever vasomotor sx.   Review of Systems     Objective:   Physical Exam BP 107/61  Pulse 79  Temp(Src) 98.3 F (36.8 C)  Ht 5\' 1"  (1.549 m)  Wt 212 lb 8 oz (96.389 kg)  BMI 40.17 kg/m2  LMP 10/03/2012  Pt in NAD       Assessment:     Menopausal state.  Reviewed the risks vs benefits of [facility].  Report treating pt for the least amount of time at the lowest EFFECTIVE dose.  Pt opts to increase dose of meds      Plan:     Pt to call in 3 months for a refill if sx improved.  If not will increase dosage of [facility] Mammogram yearly (last 07/2013) Increase Estrace 1mg  po q day Exercise daily

## 2013-12-12 ENCOUNTER — Ambulatory Visit: Payer: Medicaid Other | Admitting: Internal Medicine

## 2013-12-15 ENCOUNTER — Encounter: Payer: Self-pay | Admitting: Internal Medicine

## 2013-12-15 ENCOUNTER — Other Ambulatory Visit: Payer: Self-pay

## 2013-12-15 ENCOUNTER — Ambulatory Visit (HOSPITAL_COMMUNITY)
Admission: RE | Admit: 2013-12-15 | Discharge: 2013-12-15 | Disposition: A | Discharge status: Home / Self Care | Payer: Medicaid Other | Source: Ambulatory Visit | Attending: Internal Medicine | Admitting: Internal Medicine

## 2013-12-15 ENCOUNTER — Ambulatory Visit: Payer: Medicaid Other | Attending: Internal Medicine | Admitting: Internal Medicine

## 2013-12-15 VITALS — BP 105/63 | HR 83 | Temp 98.2°F | Resp 16 | Wt 213.2 lb

## 2013-12-15 DIAGNOSIS — M542 Cervicalgia: Secondary | ICD-10-CM | POA: Insufficient documentation

## 2013-12-15 DIAGNOSIS — K219 Gastro-esophageal reflux disease without esophagitis: Secondary | ICD-10-CM | POA: Diagnosis not present

## 2013-12-15 DIAGNOSIS — F411 Generalized anxiety disorder: Secondary | ICD-10-CM | POA: Insufficient documentation

## 2013-12-15 DIAGNOSIS — M79609 Pain in unspecified limb: Secondary | ICD-10-CM | POA: Insufficient documentation

## 2013-12-15 DIAGNOSIS — G8929 Other chronic pain: Secondary | ICD-10-CM | POA: Insufficient documentation

## 2013-12-15 DIAGNOSIS — D649 Anemia, unspecified: Secondary | ICD-10-CM | POA: Diagnosis not present

## 2013-12-15 DIAGNOSIS — R002 Palpitations: Secondary | ICD-10-CM | POA: Insufficient documentation

## 2013-12-15 DIAGNOSIS — I10 Essential (primary) hypertension: Secondary | ICD-10-CM | POA: Insufficient documentation

## 2013-12-15 DIAGNOSIS — Z8249 Family history of ischemic heart disease and other diseases of the circulatory system: Secondary | ICD-10-CM

## 2013-12-15 MED ORDER — GABAPENTIN 300 MG PO CAPS: 300.0000 mg | ORAL_CAPSULE | Freq: Every day | ORAL | Status: DC

## 2013-12-15 NOTE — Progress Notes (Signed)
MRN: [id number] Name: Ann [name]  Sex: female Age: 53 y.o. DOB: 07-20-1960  Allergies: Review of patient's allergies indicates no known allergies.  Chief Complaint  Patient presents with  . Arm Pain    HPI: Patient is 53 y.o. female who has to of anxiety hypertension chronic neck pain, she reported to have symptoms of palpitation last night as well as she feels the pain in her left arm but denies any chest pain does have some neck pain, she was seen for similar symptoms in the past in the ER and was diagnosed with anxiety symptoms, today her EKG is unchanged, she has been taking lorazepam for anxiety but she is more concerned since she has family history of heart disease and she is experiencing on and off similar symptoms despite of taking lorazepam, last month she was prescribed Zoloft but she has not started yet.  Past Medical History  Diagnosis Date  . Headache(784.0)   . Hypertension   . Anxiety   . Dysrhythmia     palpitations  . Anemia   . GERD (gastroesophageal reflux disease)     Past Surgical History  Procedure Laterality Date  . Colposcopy    . Foot surgery      right 5th toe-bone removed [id number]  . Abdominal hysterectomy N/A 03/23/2013    Procedure: HYSTERECTOMY ABDOMINAL;  Surgeon: Lavonia Drafts, MD;  Location: [facility];  Service: Gynecology;  Laterality: N/A;  . Bilateral salpingectomy Bilateral 03/23/2013    Procedure: BILATERAL SALPINGECTOMY;  Surgeon: Lavonia Drafts, MD;  Location: [facility];  Service: Gynecology;  Laterality: Bilateral;      Medication List       This list is accurate as of: 12/15/13  5:06 PM.  Always use your most recent med list.               acetaminophen 500 MG tablet  Commonly known as:  TYLENOL  Take 1,000 mg by mouth every 6 (six) hours as needed (for headache).     aspirin EC 81 MG tablet  Take 81 mg by mouth daily.     butalbital-acetaminophen-caffeine 50-325-40 MG per tablet  Commonly known as:  ESGIC    Take 1 tablet by mouth every 6 (six) hours as needed for headache.     estradiol 1 MG tablet  Commonly known as:  ESTRACE  Take 1 tablet (1 mg total) by mouth daily.     gabapentin 300 MG capsule  Commonly known as:  NEURONTIN  Take 1 capsule (300 mg total) by mouth at bedtime.     hydrocortisone 2.5 % cream  Apply topically 2 (two) times daily.     ibuprofen 800 MG tablet  Commonly known as:  ADVIL,MOTRIN  Take 1 tablet (800 mg total) by mouth every 8 (eight) hours as needed (mild pain).     lisinopril-hydrochlorothiazide 10-12.5 MG per tablet  Commonly known as:  PRINZIDE,ZESTORETIC  TAKE ONE TABLET BY MOUTH ONCE DAILY     LORazepam 0.5 MG tablet  Commonly known as:  ATIVAN  TAKE ONE TABLET BY MOUTH TWICE DAILY AS NEEDED FOR ANXIETY     NON FORMULARY  1 tbsp each apple cider vinegar/honey     ranitidine 150 MG capsule  Commonly known as:  ZANTAC  Take 1 capsule (150 mg total) by mouth daily.     sertraline 25 MG tablet  Commonly known as:  ZOLOFT  Take 1 tablet (25 mg total) by mouth daily.  Meds ordered this encounter  Medications  . gabapentin (NEURONTIN) 300 MG capsule    Sig: Take 1 capsule (300 mg total) by mouth at bedtime.    Dispense:  90 capsule    Refill:  0    Immunization History  Administered Date(s) Administered  . PPD Test 11/24/2012  . Tdap 11/17/2012    Family History  Problem Relation Age of Onset  . Arthritis Mother   . Stroke Mother   . Hypertension Mother   . Arthritis Father   . Cancer Father     prostate  . Stroke Father   . Alcohol abuse Brother   . Stroke Brother   . Cancer Paternal Aunt     breast and uterine  . Colon cancer Neg Hx     History  Substance Use Topics  . Smoking status: Never Smoker   . Smokeless tobacco: Never Used  . Alcohol Use: Yes     Comment: occ    Review of Systems   As noted in HPI  Filed Vitals:   12/15/13 1635  BP: 105/63  Pulse: 83  Temp: 98.2 F (36.8 C)  Resp: 16     Physical Exam  Physical Exam  Constitutional: No distress.  Eyes: EOM are normal. Pupils are equal, round, and reactive to light.  Neck: Normal range of motion. Neck supple.  Cardiovascular: Normal rate and regular rhythm.   Pulmonary/Chest: Breath sounds normal. No respiratory distress. She has no wheezes. She has no rales.  Musculoskeletal:  No chest wall tenderness     CBC    Component Value Date/Time   WBC 10.1 03/24/2013 0505   RBC 3.81* 03/24/2013 0505   HGB 10.9* 03/24/2013 0505   HCT 32.7* 03/24/2013 0505   PLT 211 03/24/2013 0505   MCV 85.8 03/24/2013 0505   LYMPHSABS 1.9 02/17/2013 1646   MONOABS 0.3 02/17/2013 1646   EOSABS 0.0 02/17/2013 1646   BASOSABS 0.0 02/17/2013 1646    CMP     Component Value Date/Time   NA 140 11/18/2013 0936   K 4.0 11/18/2013 0936   CL 103 11/18/2013 0936   CO2 28 11/18/2013 0936   GLUCOSE 93 11/18/2013 0936   BUN 13 11/18/2013 0936   CREATININE 0.96 11/18/2013 0936   CREATININE 0.95 03/24/2013 0505   CALCIUM 9.4 11/18/2013 0936   PROT 7.4 11/18/2013 0936   ALBUMIN 4.2 11/18/2013 0936   AST 17 11/18/2013 0936   ALT 10 11/18/2013 0936   ALKPHOS 49 11/18/2013 0936   BILITOT 0.6 11/18/2013 0936   GFRNONAA 69 11/18/2013 0936   GFRNONAA 68* 03/24/2013 0505   GFRAA 79 11/18/2013 0936   GFRAA 79* 03/24/2013 0505    Lab Results  Component Value Date/Time   CHOL 167 11/17/2012 10:01 AM    No components found with this basename: hga1c    Lab Results  Component Value Date/Time   AST 17 11/18/2013  9:36 AM    Assessment and Plan  Family history of heart disease/Palpitations - Plan: Ambulatory referral to Cardiology I have referred her to cardiology to have an evaluation done.  Cervicalgia - Plan: Prescribed gabapentin (NEURONTIN) 300 MG capsule, will repeat DG Cervical Spine Complete  Anxiety state, unspecified Patient is going to start taking Zoloft and has lorazepam to use when necessary   Return in about 3 months (around  03/17/2014) for hypertension, anxiety.  Lorayne Marek, MD

## 2013-12-15 NOTE — Progress Notes (Signed)
Patient complains of having a sensation that runs up her Neck and jaw on the left side of her head that radiates down her left  arm

## 2013-12-26 ENCOUNTER — Encounter (HOSPITAL_COMMUNITY): Payer: Self-pay | Admitting: Emergency Medicine

## 2013-12-26 ENCOUNTER — Emergency Department (HOSPITAL_COMMUNITY)
Admission: EM | Admit: 2013-12-26 | Discharge: 2013-12-26 | Disposition: A | Discharge status: Home / Self Care | Payer: Medicaid Other | Attending: Emergency Medicine | Admitting: Emergency Medicine

## 2013-12-26 DIAGNOSIS — K219 Gastro-esophageal reflux disease without esophagitis: Secondary | ICD-10-CM | POA: Insufficient documentation

## 2013-12-26 DIAGNOSIS — I499 Cardiac arrhythmia, unspecified: Secondary | ICD-10-CM | POA: Diagnosis not present

## 2013-12-26 DIAGNOSIS — R110 Nausea: Secondary | ICD-10-CM | POA: Insufficient documentation

## 2013-12-26 DIAGNOSIS — Z79899 Other long term (current) drug therapy: Secondary | ICD-10-CM | POA: Diagnosis not present

## 2013-12-26 DIAGNOSIS — I10 Essential (primary) hypertension: Secondary | ICD-10-CM | POA: Diagnosis not present

## 2013-12-26 DIAGNOSIS — IMO0002 Reserved for concepts with insufficient information to code with codable children: Secondary | ICD-10-CM | POA: Insufficient documentation

## 2013-12-26 DIAGNOSIS — F419 Anxiety disorder, unspecified: Secondary | ICD-10-CM

## 2013-12-26 DIAGNOSIS — Z862 Personal history of diseases of the blood and blood-forming organs and certain disorders involving the immune mechanism: Secondary | ICD-10-CM | POA: Insufficient documentation

## 2013-12-26 DIAGNOSIS — F411 Generalized anxiety disorder: Secondary | ICD-10-CM | POA: Diagnosis not present

## 2013-12-26 DIAGNOSIS — Z7982 Long term (current) use of aspirin: Secondary | ICD-10-CM | POA: Diagnosis not present

## 2013-12-26 LAB — CBC WITH DIFFERENTIAL/PLATELET
BASOS ABS: 0 10*3/uL (ref 0.0–0.1)
BASOS PCT: 0 % (ref 0–1)
EOS PCT: 1 % (ref 0–5)
Eosinophils Absolute: 0 10*3/uL (ref 0.0–0.7)
HEMATOCRIT: 41.3 % (ref 36.0–46.0)
Hemoglobin: 13.8 g/dL (ref 12.0–15.0)
Lymphocytes Relative: 31 % (ref 12–46)
Lymphs Abs: 1.3 10*3/uL (ref 0.7–4.0)
MCH: 29.7 pg (ref 26.0–34.0)
MCHC: 33.4 g/dL (ref 30.0–36.0)
MCV: 88.8 fL (ref 78.0–100.0)
Monocytes Absolute: 0.3 10*3/uL (ref 0.1–1.0)
Monocytes Relative: 8 % (ref 3–12)
Neutro Abs: 2.6 10*3/uL (ref 1.7–7.7)
Neutrophils Relative %: 60 % (ref 43–77)
Platelets: 213 10*3/uL (ref 150–400)
RBC: 4.65 MIL/uL (ref 3.87–5.11)
RDW: 14.4 % (ref 11.5–15.5)
WBC: 4.3 10*3/uL (ref 4.0–10.5)

## 2013-12-26 LAB — URINE MICROSCOPIC-ADD ON

## 2013-12-26 LAB — URINALYSIS, ROUTINE W REFLEX MICROSCOPIC
BILIRUBIN URINE: NEGATIVE
Glucose, UA: NEGATIVE mg/dL
HGB URINE DIPSTICK: NEGATIVE
KETONES UR: NEGATIVE mg/dL
NITRITE: NEGATIVE
PROTEIN: NEGATIVE mg/dL
Specific Gravity, Urine: 1.014 (ref 1.005–1.030)
UROBILINOGEN UA: 0.2 mg/dL (ref 0.0–1.0)
pH: 6 (ref 5.0–8.0)

## 2013-12-26 LAB — COMPREHENSIVE METABOLIC PANEL
ALBUMIN: 3.6 g/dL (ref 3.5–5.2)
ALT: 10 U/L (ref 0–35)
ANION GAP: 12 (ref 5–15)
AST: 18 U/L (ref 0–37)
Alkaline Phosphatase: 47 U/L (ref 39–117)
BILIRUBIN TOTAL: 0.6 mg/dL (ref 0.3–1.2)
BUN: 12 mg/dL (ref 6–23)
CALCIUM: 9.2 mg/dL (ref 8.4–10.5)
CHLORIDE: 102 meq/L (ref 96–112)
CO2: 26 meq/L (ref 19–32)
CREATININE: 0.97 mg/dL (ref 0.50–1.10)
GFR calc Af Amer: 77 mL/min — ABNORMAL LOW (ref >90)
GFR, EST NON AFRICAN AMERICAN: 66 mL/min — AB (ref >90)
Glucose, Bld: 90 mg/dL (ref 70–99)
Potassium: 3.7 mEq/L (ref 3.7–5.3)
Sodium: 140 mEq/L (ref 137–147)
Total Protein: 7.3 g/dL (ref 6.0–8.3)

## 2013-12-26 LAB — LIPASE, BLOOD: Lipase: 22 U/L (ref 11–59)

## 2013-12-26 LAB — TROPONIN I

## 2013-12-26 MED ORDER — ONDANSETRON HCL 4 MG PO TABS: 4.0000 mg | ORAL_TABLET | Freq: Three times a day (TID) | ORAL | Status: DC | PRN

## 2013-12-26 NOTE — ED Notes (Signed)
Pt reports episodes of palpitations for approx 6-8 months. This morning pt woke up and was nauseated. Lightheaded. Denies CP/SOB.

## 2013-12-26 NOTE — Discharge Instructions (Signed)
Use the zofran if your nausea isn't going away in 15-30 minutes. Keep your appointments with your doctor and the cardiologist. Start the gabapentin for the pain in your left face. Recheck as needed.    Nausea, Adult Nausea is the feeling that you have an upset stomach or have to vomit. Nausea by itself is not likely a serious concern, but it may be an early sign of more serious medical problems. As nausea gets worse, it can lead to vomiting. If vomiting develops, there is the risk of dehydration.  CAUSES   Viral infections.  Food poisoning.  Medicines.  Pregnancy.  Motion sickness.  Migraine headaches.  Emotional distress.  Severe pain from any source.  Alcohol intoxication. HOME CARE INSTRUCTIONS  Get plenty of rest.  Ask your caregiver about specific rehydration instructions.  Eat small amounts of food and sip liquids more often.  Take all medicines as told by your caregiver. SEEK MEDICAL CARE IF:  You have not improved after 2 days, or you get worse.  You have a headache. SEEK IMMEDIATE MEDICAL CARE IF:   You have a fever.  You faint.  You keep vomiting or have blood in your vomit.  You are extremely weak or dehydrated.  You have dark or bloody stools.  You have severe chest or abdominal pain. MAKE SURE YOU:  Understand these instructions.  Will watch your condition.  Will get help right away if you are not doing well or get worse. Document Released: 06/05/2004 Document Revised: 01/21/2012 Document Reviewed: 01/08/2011 [facility] Patient Information 2015 [place], [place]. This information is not intended to replace advice given to you by your health care provider. Make sure you discuss any questions you have with your health care provider.

## 2013-12-26 NOTE — ED Notes (Signed)
MD at bedside.-Dr. Knapp 

## 2013-12-26 NOTE — ED Provider Notes (Signed)
CSN: [id number]     Arrival date & time 12/26/13  0102 History   First MD Initiated Contact with Patient 12/26/13 0920     Chief Complaint  Patient presents with  . Palpitations  . Nausea     (Consider location/radiation/quality/duration/timing/severity/associated sxs/prior Treatment) HPI Patient reports she got up about 8 AM this morning and had some nausea. She states she sat on the toilet and had a mildly loose BM. Felt lightheaded and got a little bit sweaty. She denies having any palpitations. She states she drank some water and the nausea went away in about 15 minutes. She states this is the fourth day in a row she has had nausea in the morning. She states a couple days ago she also had nausea in the afternoon. The nausea only lasts 15 minutes. She also notes drinking water makes it go away. She denies any headache now but states for the past 3 weeks she has had a discomfort around her left ear and left jaw that can radiate into her left neck and left shoulder. It does not happen daily. She describes it as a numbing sensation and not pain. She states it started again today as she was driving to the ED to be evaluated for her episodes of nausea. She does not associate the discomfort or nausea with anything that she does or eats. She does describe she is having some loss of appetite and not eating as much. She denies any visual changes, rhinorrhea, urine, sore throat, pain on chewing, chest pain, shortness of breath, abdominal pain, diarrhea, constipation, fever, but she did have chills 3 days ago. She denies dysuria or frequency. She denies feeling depressed but states "I have a lot going on". She describes some difficulty sleeping. She also describes loss of appetite.  Patient states she was recently prescribed gabapentin for muscle pain which she has not started yet. She also has been on Zoloft for one week. Patient also describes episodes of palpitations for the past 6-8 months. She however did  not happen today.  PCP Dr Annitta Needs at [facility]  Past Medical History  Diagnosis Date  . Headache(784.0)   . Hypertension   . Anxiety   . Dysrhythmia     palpitations  . Anemia   . GERD (gastroesophageal reflux disease)    Past Surgical History  Procedure Laterality Date  . Colposcopy    . Foot surgery      right 5th toe-bone removed [id number]  . Abdominal hysterectomy N/A 03/23/2013    Procedure: HYSTERECTOMY ABDOMINAL;  Surgeon: Lavonia Drafts, MD;  Location: [facility];  Service: Gynecology;  Laterality: N/A;  . Bilateral salpingectomy Bilateral 03/23/2013    Procedure: BILATERAL SALPINGECTOMY;  Surgeon: Lavonia Drafts, MD;  Location: [facility];  Service: Gynecology;  Laterality: Bilateral;   Family History  Problem Relation Age of Onset  . Arthritis Mother   . Stroke Mother   . Hypertension Mother   . Arthritis Father   . Cancer Father     prostate  . Stroke Father   . Alcohol abuse Brother   . Stroke Brother   . Cancer Paternal Aunt     breast and uterine  . Colon cancer Neg Hx    History  Substance Use Topics  . Smoking status: Never Smoker   . Smokeless tobacco: Never Used  . Alcohol Use: Yes     Comment: occ   employed  OB History   Grav Para Term Preterm Abortions TAB SAB  Ect Mult Living   4 3 2 1 1 1  0 0 0 2     Review of Systems  All other systems reviewed and are negative.     Allergies  Review of patient's allergies indicates no known allergies.  Home Medications   Prior to Admission medications   Medication Sig Start Date End Date Taking? Authorizing Provider  acetaminophen (TYLENOL) 500 MG tablet Take 1,000 mg by mouth every 6 (six) hours as needed (for headache).   Yes Historical Provider, MD  aspirin EC 81 MG tablet Take 81 mg by mouth daily.   Yes Historical Provider, MD  estradiol (ESTRACE) 1 MG tablet Take 1 tablet (1 mg total) by mouth daily. 12/01/13  Yes Lavonia Drafts, MD  hydrocortisone 2.5 %  cream Apply topically 2 (two) times daily. 09/14/13  Yes Lorayne Marek, MD  lisinopril-hydrochlorothiazide (PRINZIDE,ZESTORETIC) 10-12.5 MG per tablet Take 1 tablet by mouth daily. 11/18/13  Yes Deepak Advani, MD  LORazepam (ATIVAN) 0.5 MG tablet Take 0.5 mg by mouth 2 (two) times daily as needed for anxiety or sleep. 11/18/13  Yes Lorayne Marek, MD  ranitidine (ZANTAC) 150 MG capsule Take 1 capsule (150 mg total) by mouth daily. 02/17/13  Yes Pamella Pert, MD  sertraline (ZOLOFT) 25 MG tablet Take 1 tablet (25 mg total) by mouth daily. 11/18/13  Yes Lorayne Marek, MD  gabapentin (NEURONTIN) 300 MG capsule Take 1 capsule (300 mg total) by mouth at bedtime. 12/15/13   Lorayne Marek, MD   BP 123/91  Pulse 72  Temp(Src) 98.9 F (37.2 C) (Oral)  Resp 20  SpO2 100%  LMP 10/03/2012  Vital signs normal   Physical Exam  Nursing note and vitals reviewed. Constitutional: She is oriented to person, place, and time. She appears well-developed and well-nourished.  Non-toxic appearance. She does not appear ill. No distress.  HENT:  Head: Normocephalic and atraumatic.  Right Ear: External ear normal.  Left Ear: External ear normal.  Nose: Nose normal. No mucosal edema or rhinorrhea.  Mouth/Throat: Oropharynx is clear and moist and mucous membranes are normal. No dental abscesses or uvula swelling.  Nontender to exam of the left TMJ (palpated through the left ear canal), the joint moves smoothly without clicking  Eyes: Conjunctivae and EOM are normal. Pupils are equal, round, and reactive to light.  Neck: Normal range of motion and full passive range of motion without pain. Neck supple.  Cardiovascular: Normal rate, regular rhythm and normal heart sounds.  Exam reveals no gallop and no friction rub.   No murmur heard. Pulmonary/Chest: Effort normal and breath sounds normal. No respiratory distress. She has no wheezes. She has no rhonchi. She has no rales. She exhibits no tenderness and no crepitus.   Abdominal: Soft. Normal appearance and bowel sounds are normal. She exhibits no distension. There is no tenderness. There is no rebound and no guarding.  Musculoskeletal: Normal range of motion. She exhibits no edema and no tenderness.  Moves all extremities well.   Neurological: She is alert and oriented to person, place, and time. She has normal strength. No cranial nerve deficit.  Skin: Skin is warm, dry and intact. No rash noted. No erythema. No pallor.  Psychiatric: She has a normal mood and affect. Her speech is normal and behavior is normal. Her mood appears not anxious.    ED Course  Procedures (including critical care time)  Review of patient's prior records shows she had a sedimentation rate of 17 which is normal, and a CRP  of 0.7 which is normal on May 16. I am not going to repeat it today to look for temporal arteritis with the left ear/TMJ pain.  Pt was seen by her PCP on 8/6 and he is going to refer to cardiology for her c/o palpitations and neck pain/chest pain  Urine culture 7/9 NO GROWTH, her UA is almost exactly the same as it is today, so I am not going to treat since she doesn't have symptoms.   19:51 pt given her test results. She states she has a cardiology appointment next month. Pt seems very anxious, is afraid she has diabetes, has no family history. Pt encouraged to start her gabapentin which may help with the pain in her left face ? Trigeminal neuralgia  Labs Review Results for orders placed during the hospital encounter of 12/26/13  CBC WITH DIFFERENTIAL      Result Value Ref Range   WBC 4.3  4.0 - 10.5 K/uL   RBC 4.65  3.87 - 5.11 MIL/uL   Hemoglobin 13.8  12.0 - 15.0 g/dL   HCT 41.3  36.0 - 46.0 %   MCV 88.8  78.0 - 100.0 fL   MCH 29.7  26.0 - 34.0 pg   MCHC 33.4  30.0 - 36.0 g/dL   RDW 14.4  11.5 - 15.5 %   Platelets 213  150 - 400 K/uL   Neutrophils Relative % 60  43 - 77 %   Neutro Abs 2.6  1.7 - 7.7 K/uL   Lymphocytes Relative 31  12 - 46 %    Lymphs Abs 1.3  0.7 - 4.0 K/uL   Monocytes Relative 8  3 - 12 %   Monocytes Absolute 0.3  0.1 - 1.0 K/uL   Eosinophils Relative 1  0 - 5 %   Eosinophils Absolute 0.0  0.0 - 0.7 K/uL   Basophils Relative 0  0 - 1 %   Basophils Absolute 0.0  0.0 - 0.1 K/uL  COMPREHENSIVE METABOLIC PANEL      Result Value Ref Range   Sodium 140  137 - 147 mEq/L   Potassium 3.7  3.7 - 5.3 mEq/L   Chloride 102  96 - 112 mEq/L   CO2 26  19 - 32 mEq/L   Glucose, Bld 90  70 - 99 mg/dL   BUN 12  6 - 23 mg/dL   Creatinine, Ser 0.97  0.50 - 1.10 mg/dL   Calcium 9.2  8.4 - 10.5 mg/dL   Total Protein 7.3  6.0 - 8.3 g/dL   Albumin 3.6  3.5 - 5.2 g/dL   AST 18  0 - 37 U/L   ALT 10  0 - 35 U/L   Alkaline Phosphatase 47  39 - 117 U/L   Total Bilirubin 0.6  0.3 - 1.2 mg/dL   GFR calc non Af Amer 66 (*) >90 mL/min   GFR calc Af Amer 77 (*) >90 mL/min   Anion gap 12  5 - 15  LIPASE, BLOOD      Result Value Ref Range   Lipase 22  11 - 59 U/L  TROPONIN I      Result Value Ref Range   Troponin I <0.30  <0.30 ng/mL  URINALYSIS, ROUTINE W REFLEX MICROSCOPIC      Result Value Ref Range   Color, Urine YELLOW  YELLOW   APPearance CLEAR  CLEAR   Specific Gravity, Urine 1.014  1.005 - 1.030   pH 6.0  5.0 - 8.0  Glucose, UA NEGATIVE  NEGATIVE mg/dL   Hgb urine dipstick NEGATIVE  NEGATIVE   Bilirubin Urine NEGATIVE  NEGATIVE   Ketones, ur NEGATIVE  NEGATIVE mg/dL   Protein, ur NEGATIVE  NEGATIVE mg/dL   Urobilinogen, UA 0.2  0.0 - 1.0 mg/dL   Nitrite NEGATIVE  NEGATIVE   Leukocytes, UA SMALL (*) NEGATIVE  URINE MICROSCOPIC-ADD ON      Result Value Ref Range   Squamous Epithelial / LPF RARE  RARE   WBC, UA 3-6  <3 WBC/hpf   RBC / HPF 0-2  <3 RBC/hpf   Bacteria, UA RARE  RARE   Laboratory interpretation all normal except ? uti     Imaging Review No results found.   EKG Interpretation   Date/Time:  Monday December 26 2013 09:32:06 EDT Ventricular Rate:  73 PR Interval:  145 QRS Duration: 89 QT  Interval:  377 QTC Calculation: 415 R Axis:   38 Text Interpretation:  Sinus rhythm Low voltage, precordial leads Baseline  wander in lead(s) V2 No significant change since last tracing 15 Dec 2013  Confirmed by [facility]  MD-I, Juandiego Kolenovic ([id number]) on 12/26/2013 9:52:07 AM      MDM   Final diagnoses:  Nausea  Anxiety    New Prescriptions   ONDANSETRON (ZOFRAN) 4 MG TABLET    Take 1 tablet (4 mg total) by mouth every 8 (eight) hours as needed for nausea or vomiting.    Plan discharge  Rolland Porter, MD, Alanson Aly, MD 12/26/13 1057

## 2014-01-11 ENCOUNTER — Encounter: Payer: Self-pay | Admitting: Internal Medicine

## 2014-01-11 ENCOUNTER — Ambulatory Visit: Payer: Medicaid Other | Attending: Internal Medicine | Admitting: Internal Medicine

## 2014-01-11 VITALS — BP 118/81 | HR 65 | Temp 98.6°F | Resp 16 | Wt 208.2 lb

## 2014-01-11 DIAGNOSIS — R51 Headache: Secondary | ICD-10-CM | POA: Diagnosis not present

## 2014-01-11 DIAGNOSIS — I10 Essential (primary) hypertension: Secondary | ICD-10-CM | POA: Diagnosis not present

## 2014-01-11 DIAGNOSIS — D649 Anemia, unspecified: Secondary | ICD-10-CM | POA: Diagnosis not present

## 2014-01-11 DIAGNOSIS — K219 Gastro-esophageal reflux disease without esophagitis: Secondary | ICD-10-CM | POA: Diagnosis not present

## 2014-01-11 DIAGNOSIS — L03114 Cellulitis of left upper limb: Secondary | ICD-10-CM

## 2014-01-11 DIAGNOSIS — M7989 Other specified soft tissue disorders: Secondary | ICD-10-CM | POA: Insufficient documentation

## 2014-01-11 DIAGNOSIS — W57XXXA Bitten or stung by nonvenomous insect and other nonvenomous arthropods, initial encounter: Secondary | ICD-10-CM

## 2014-01-11 DIAGNOSIS — T148 Other injury of unspecified body region: Secondary | ICD-10-CM

## 2014-01-11 DIAGNOSIS — F411 Generalized anxiety disorder: Secondary | ICD-10-CM | POA: Diagnosis not present

## 2014-01-11 DIAGNOSIS — L539 Erythematous condition, unspecified: Secondary | ICD-10-CM | POA: Insufficient documentation

## 2014-01-11 DIAGNOSIS — R52 Pain, unspecified: Secondary | ICD-10-CM

## 2014-01-11 DIAGNOSIS — IMO0002 Reserved for concepts with insufficient information to code with codable children: Secondary | ICD-10-CM

## 2014-01-11 DIAGNOSIS — IMO0001 Reserved for inherently not codable concepts without codable children: Secondary | ICD-10-CM | POA: Insufficient documentation

## 2014-01-11 MED ORDER — CEPHALEXIN 500 MG PO CAPS: 500.0000 mg | ORAL_CAPSULE | Freq: Four times a day (QID) | ORAL | Status: DC

## 2014-01-11 MED ORDER — IBUPROFEN 600 MG PO TABS: 600.0000 mg | ORAL_TABLET | Freq: Three times a day (TID) | ORAL | Status: DC | PRN

## 2014-01-11 NOTE — Progress Notes (Signed)
MRN: [id number] Name: Ann [name]  Sex: female Age: 53 y.o. DOB: 05-01-1961  Allergies: Review of patient's allergies indicates no known allergies.  Chief Complaint  Patient presents with  . Insect Bite    HPI: Patient is 53 y.o. female who comes today reported to have noticed erythema and swelling in her left upper extremity, not sure if it was insect bite she denies any fever chills but has pain around that area denies any discharge, she is applied over the counter cream.  Past Medical History  Diagnosis Date  . Headache(784.0)   . Hypertension   . Anxiety   . Dysrhythmia     palpitations  . Anemia   . GERD (gastroesophageal reflux disease)     Past Surgical History  Procedure Laterality Date  . Colposcopy    . Foot surgery      right 5th toe-bone removed [id number]  . Abdominal hysterectomy N/A 03/23/2013    Procedure: HYSTERECTOMY ABDOMINAL;  Surgeon: Lavonia Drafts, MD;  Location: [facility];  Service: Gynecology;  Laterality: N/A;  . Bilateral salpingectomy Bilateral 03/23/2013    Procedure: BILATERAL SALPINGECTOMY;  Surgeon: Lavonia Drafts, MD;  Location: [facility];  Service: Gynecology;  Laterality: Bilateral;      Medication List       This list is accurate as of: 01/11/14  4:10 PM.  Always use your most recent med list.               acetaminophen 500 MG tablet  Commonly known as:  TYLENOL  Take 1,000 mg by mouth every 6 (six) hours as needed (for headache).     aspirin EC 81 MG tablet  Take 81 mg by mouth daily.     cephALEXin 500 MG capsule  Commonly known as:  KEFLEX  Take 1 capsule (500 mg total) by mouth 4 (four) times daily.     estradiol 1 MG tablet  Commonly known as:  ESTRACE  Take 1 tablet (1 mg total) by mouth daily.     gabapentin 300 MG capsule  Commonly known as:  NEURONTIN  Take 1 capsule (300 mg total) by mouth at bedtime.     hydrocortisone 2.5 % cream  Apply topically 2 (two) times daily.     ibuprofen 600 MG  tablet  Commonly known as:  ADVIL,MOTRIN  Take 1 tablet (600 mg total) by mouth every 8 (eight) hours as needed.     lisinopril-hydrochlorothiazide 10-12.5 MG per tablet  Commonly known as:  PRINZIDE,ZESTORETIC  Take 1 tablet by mouth daily.     LORazepam 0.5 MG tablet  Commonly known as:  ATIVAN  Take 0.5 mg by mouth 2 (two) times daily as needed for anxiety or sleep.     ondansetron 4 MG tablet  Commonly known as:  ZOFRAN  Take 1 tablet (4 mg total) by mouth every 8 (eight) hours as needed for nausea or vomiting.     ranitidine 150 MG capsule  Commonly known as:  ZANTAC  Take 1 capsule (150 mg total) by mouth daily.     sertraline 25 MG tablet  Commonly known as:  ZOLOFT  Take 1 tablet (25 mg total) by mouth daily.        Meds ordered this encounter  Medications  . ibuprofen (ADVIL,MOTRIN) 600 MG tablet    Sig: Take 1 tablet (600 mg total) by mouth every 8 (eight) hours as needed.    Dispense:  30 tablet    Refill:  1  .  cephALEXin (KEFLEX) 500 MG capsule    Sig: Take 1 capsule (500 mg total) by mouth 4 (four) times daily.    Dispense:  28 capsule    Refill:  0    Immunization History  Administered Date(s) Administered  . PPD Test 11/24/2012  . Tdap 11/17/2012    Family History  Problem Relation Age of Onset  . Arthritis Mother   . Stroke Mother   . Hypertension Mother   . Arthritis Father   . Cancer Father     prostate  . Stroke Father   . Alcohol abuse Brother   . Stroke Brother   . Cancer Paternal Aunt     breast and uterine  . Colon cancer Neg Hx     History  Substance Use Topics  . Smoking status: Never Smoker   . Smokeless tobacco: Never Used  . Alcohol Use: Yes     Comment: occ    Review of Systems   As noted in HPI  Filed Vitals:   01/11/14 1544  BP: 118/81  Pulse: 65  Temp: 98.6 F (37 C)  Resp: 16    Physical Exam  Physical Exam  Constitutional: No distress.  Eyes: EOM are normal. Pupils are equal, round, and reactive  to light.  Cardiovascular: Normal rate and regular rhythm.   Pulmonary/Chest: Breath sounds normal. No respiratory distress. She has no wheezes. She has no rales.  Musculoskeletal:  Left upper extremity localized erythema warmth and tenderness no discharge     CBC    Component Value Date/Time   WBC 4.3 12/26/2013 0943   RBC 4.65 12/26/2013 0943   HGB 13.8 12/26/2013 0943   HCT 41.3 12/26/2013 0943   PLT 213 12/26/2013 0943   MCV 88.8 12/26/2013 0943   LYMPHSABS 1.3 12/26/2013 0943   MONOABS 0.3 12/26/2013 0943   EOSABS 0.0 12/26/2013 0943   BASOSABS 0.0 12/26/2013 0943    CMP     Component Value Date/Time   NA 140 12/26/2013 0943   K 3.7 12/26/2013 0943   CL 102 12/26/2013 0943   CO2 26 12/26/2013 0943   GLUCOSE 90 12/26/2013 0943   BUN 12 12/26/2013 0943   CREATININE 0.97 12/26/2013 0943   CREATININE 0.96 11/18/2013 0936   CALCIUM 9.2 12/26/2013 0943   PROT 7.3 12/26/2013 0943   ALBUMIN 3.6 12/26/2013 0943   AST 18 12/26/2013 0943   ALT 10 12/26/2013 0943   ALKPHOS 47 12/26/2013 0943   BILITOT 0.6 12/26/2013 0943   GFRNONAA 66* 12/26/2013 0943   GFRNONAA 69 11/18/2013 0936   GFRAA 77* 12/26/2013 0943   GFRAA 79 11/18/2013 0936    Lab Results  Component Value Date/Time   CHOL 167 11/17/2012 10:01 AM    No components found with this basename: hga1c    Lab Results  Component Value Date/Time   AST 18 12/26/2013  9:43 AM    Assessment and Plan  Insect bite/Cellulitis of left upper extremity  - Plan: I have prescribed cephALEXin (KEFLEX) 500 MG capsule for 7 days, patient will come back in 2 weeks for recheck nurses it.  Pain - Plan: ibuprofen (ADVIL,MOTRIN) 600 MG tablet    Return for wound  check in 2 weeks/Nurse Visit.  Lorayne Marek, MD

## 2014-01-11 NOTE — Progress Notes (Signed)
Patient here for bug bite to her left upper arm Area is red swollen and hot to the touch Has been there for about 3 days

## 2014-01-19 ENCOUNTER — Telehealth: Payer: Self-pay | Admitting: Internal Medicine

## 2014-01-19 NOTE — Telephone Encounter (Signed)
Pt calling to report that due to current medications has a possible yeast infection. Pt will be coming in for wound check on 01/20/14 and wanted to know if she can have a medication prescribed for infection.

## 2014-01-20 ENCOUNTER — Ambulatory Visit: Payer: Medicaid Other | Attending: Internal Medicine

## 2014-01-20 MED ORDER — FLUCONAZOLE 150 MG PO TABS: 150.0000 mg | ORAL_TABLET | Freq: Once | ORAL | Status: DC

## 2014-01-20 NOTE — Patient Instructions (Signed)
Continue cleaning wound to left medial arm with applying Neosporin to affected area Pt given prescription for Diflucan for yeast infection with taking Keflex Complete antibiotic course Return to clinic if increased swelling,drainage or pain

## 2014-02-06 ENCOUNTER — Encounter: Payer: Self-pay | Admitting: Cardiovascular Disease

## 2014-02-06 ENCOUNTER — Ambulatory Visit (INDEPENDENT_AMBULATORY_CARE_PROVIDER_SITE_OTHER): Payer: Medicaid Other | Admitting: Cardiovascular Disease

## 2014-02-06 VITALS — BP 104/68 | HR 85 | Ht 61.0 in | Wt 211.4 lb

## 2014-02-06 DIAGNOSIS — I10 Essential (primary) hypertension: Secondary | ICD-10-CM

## 2014-02-06 DIAGNOSIS — R002 Palpitations: Secondary | ICD-10-CM | POA: Insufficient documentation

## 2014-02-06 DIAGNOSIS — R079 Chest pain, unspecified: Secondary | ICD-10-CM

## 2014-02-06 NOTE — Assessment & Plan Note (Signed)
Well controlled.  Continue current medications and low sodium Dash type diet.    

## 2014-02-06 NOTE — Assessment & Plan Note (Signed)
Atypical normal ECG  F/u ETT

## 2014-02-06 NOTE — Progress Notes (Signed)
Patient ID: Ann [name], female   DOB: 1961-05-05, 53 y.o.   MRN: [id number]   53 yo referred by primary for atypical chest pain and palpitations  Palpitations over past year.  ? Worse going through menapause  Improved since starting zoloft.  Occasional flip flops no sustained or prolonged tachycardia  Gets sharp left sided pain.  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  Pain sometimes relieved with NSAI/   She is sedentary and does not exercise.  Denies smoking Occasional red wine.  CRF HTN on Rx and compliant  Also now on estrogen replacement       ROS: Denies fever, malais, weight loss, blurry vision, decreased visual acuity, cough, sputum, SOB, hemoptysis, pleuritic pain, palpitaitons, heartburn, abdominal pain, melena, lower extremity edema, claudication, or rash.  All other systems reviewed and negative   General: Affect appropriate Healthy:  appears stated age 25: normal Neck supple with no adenopathy JVP normal no bruits no thyromegaly Lungs clear with no wheezing and good diaphragmatic motion Heart:  S1/S2 no murmur,rub, gallop or click PMI normal Abdomen: benighn, BS positve, no tenderness, no AAA no bruit.  No HSM or HJR Distal pulses intact with no bruits No edema Neuro non-focal Skin warm and dry No muscular weakness  Medications Current Outpatient Prescriptions  Medication Sig Dispense Refill  . acetaminophen (TYLENOL) 500 MG tablet Take 1,000 mg by mouth every 6 (six) hours as needed (for headache).      Marland Kitchen aspirin EC 81 MG tablet Take 81 mg by mouth daily.      Marland Kitchen estradiol (ESTRACE) 1 MG tablet Take 1 tablet (1 mg total) by mouth daily.  30 tablet  3  . gabapentin (NEURONTIN) 300 MG capsule Take 300 mg by mouth at bedtime as needed.      . hydrocortisone 2.5 % cream Apply topically as needed (FOR ECZEMA).      Marland Kitchen ibuprofen (ADVIL,MOTRIN) 600 MG tablet Take 1 tablet (600 mg total) by mouth every 8  (eight) hours as needed.  30 tablet  1  . lisinopril-hydrochlorothiazide (PRINZIDE,ZESTORETIC) 10-12.5 MG per tablet Take 1 tablet by mouth daily.      Marland Kitchen LORazepam (ATIVAN) 0.5 MG tablet Take 0.5 mg by mouth as needed for anxiety or sleep.       Marland Kitchen sertraline (ZOLOFT) 25 MG tablet Take 1 tablet (25 mg total) by mouth daily.  30 tablet  3  . ranitidine (ZANTAC) 150 MG capsule Take 1 capsule (150 mg total) by mouth daily.  30 capsule  1   No current facility-administered medications for this visit.    Allergies Review of patient's allergies indicates no known allergies.  Family History: Family History  Problem Relation Age of Onset  . Arthritis Mother   . Stroke Mother   . Hypertension Mother   . Arthritis Father   . Cancer Father     prostate  . Stroke Father   . Alcohol abuse Brother   . Stroke Brother   . Cancer Paternal Aunt     breast and uterine  . Colon cancer Neg Hx     Social History: History   Social History  . Marital Status: Single    Spouse Name: N/A    Number of Children: N/A  . Years of Education: N/A   Occupational History  . Not on file.   Social History Main Topics  . Smoking status:  Never Smoker   . Smokeless tobacco: Never Used  . Alcohol Use: Yes     Comment: occ  . Drug Use: No  . Sexual Activity: Yes    Birth Control/ Protection: Surgical   Other Topics Concern  . Not on file   Social History Narrative  . No narrative on file    Electrocardiogram:  SR low voltage from body habitus otherwise normal   Assessment and Plan

## 2014-02-06 NOTE — Patient Instructions (Signed)

## 2014-02-06 NOTE — Assessment & Plan Note (Signed)
Benign Improved with RX menapause zoloft and estrace.  ECG normal with no pre excitation and normal QT.  Benign exam  No further w/u if ETT is normal

## 2014-02-13 ENCOUNTER — Encounter: Payer: Medicaid Other | Admitting: Physician Assistant

## 2014-03-03 ENCOUNTER — Telehealth: Payer: Self-pay | Admitting: Internal Medicine

## 2014-03-03 ENCOUNTER — Other Ambulatory Visit: Payer: Self-pay | Admitting: Emergency Medicine

## 2014-03-03 MED ORDER — LISINOPRIL-HYDROCHLOROTHIAZIDE 10-12.5 MG PO TABS: 1.0000 | ORAL_TABLET | Freq: Every day | ORAL | Status: DC

## 2014-03-03 NOTE — Telephone Encounter (Signed)
Pt. Called to check on status of med refill, pt. Would like the medication to be sent to [facility] on [address]. Please f/u with pt.

## 2014-03-03 NOTE — Telephone Encounter (Signed)
Patient calling to request medication refill for LORazepam (ATIVAN) 0.5 MG tablet  And  lisinopril-hydrochlorothiazide (PRINZIDE,ZESTORETIC) 10-12.5 MG per tablet. Patient states both refills can be sent to [facility] on [address]. Please assist.

## 2014-03-03 NOTE — Telephone Encounter (Signed)
Pt requesting medication refill Ativan. Please f/u

## 2014-03-06 NOTE — Telephone Encounter (Signed)
Patient can be given refill on Ativan.

## 2014-03-07 ENCOUNTER — Telehealth: Payer: Self-pay | Admitting: Emergency Medicine

## 2014-03-07 MED ORDER — LORAZEPAM 0.5 MG PO TABS: 0.5000 mg | ORAL_TABLET | ORAL | Status: DC | PRN

## 2014-03-07 NOTE — Telephone Encounter (Signed)
Pt informed medication Ativan reordered per Dr. Annitta Needs She can pick script up at front desk tomorrow

## 2014-03-08 ENCOUNTER — Ambulatory Visit (INDEPENDENT_AMBULATORY_CARE_PROVIDER_SITE_OTHER): Payer: Medicaid Other | Admitting: Physician Assistant

## 2014-03-08 DIAGNOSIS — R002 Palpitations: Secondary | ICD-10-CM

## 2014-03-08 DIAGNOSIS — R079 Chest pain, unspecified: Secondary | ICD-10-CM

## 2014-03-08 NOTE — Progress Notes (Signed)
Exercise Treadmill Test  Pre-Exercise Testing Evaluation Rhythm: normal sinus  Rate: 80 bpm     Test  Exercise Tolerance Test Ordering MD: Jenkins Rouge, MD  Interpreting MD: Ermalinda Barrios, PA-C  Unique Test No: 1  Treadmill:  1  Indication for ETT: chest pain - rule out ischemia  Contraindication to ETT: No   Stress Modality: exercise - treadmill  Cardiac Imaging Performed: non   Protocol: standard Bruce - maximal  Max BP:  179/83  Max MPHR (bpm):  168 85% MPR (bpm):  143  MPHR obtained (bpm):  176 % MPHR obtained:  104  Reached 85% MPHR (min:sec):  3:15 Total Exercise Time (min-sec):  7:00  Workload in METS:  8.5 Borg Scale: 17  Reason ETT Terminated:  fatigue    ST Segment Analysis At Rest: normal ST segments - no evidence of significant ST depression With Exercise: no evidence of significant ST depression  Other Information Arrhythmia:  No Angina during ETT:  absent (0) Quality of ETT:  diagnostic  ETT Interpretation:  normal - no evidence of ischemia by ST analysis  Comments: Poor exercise tolerance. Increased HR quickly. Some bigeminy in recovery. None during exercise.  Recommendations: F/U Dr. Johnsie Cancel

## 2014-03-13 ENCOUNTER — Telehealth: Payer: Self-pay | Admitting: *Deleted

## 2014-03-13 ENCOUNTER — Encounter: Payer: Self-pay | Admitting: Cardiovascular Disease

## 2014-03-13 NOTE — Telephone Encounter (Signed)
Follow up ° ° ° ° ° °Returning Ann [name]'s call °

## 2014-03-13 NOTE — Telephone Encounter (Signed)
lmtcb ./cy 

## 2014-03-13 NOTE — Telephone Encounter (Signed)
PT  AWARE./CY 

## 2014-03-13 NOTE — Telephone Encounter (Signed)
-----   Message from Josue Hector, MD sent at 03/08/2014 11:30 PM EDT ----- Normal ETT  ----- Message -----    From: Imogene Burn, PA-C    Sent: 03/08/2014  11:34 AM      To: Josue Hector, MD

## 2014-03-31 ENCOUNTER — Other Ambulatory Visit: Payer: Self-pay | Admitting: Internal Medicine

## 2014-04-17 ENCOUNTER — Telehealth: Payer: Self-pay | Admitting: *Deleted

## 2014-04-17 DIAGNOSIS — R232 Flushing: Secondary | ICD-10-CM

## 2014-04-17 MED ORDER — ESTRADIOL 1 MG PO TABS: 1.0000 mg | ORAL_TABLET | Freq: Every day | ORAL | Status: DC

## 2014-04-17 NOTE — Telephone Encounter (Signed)
Ann [name] called and left a message 04/14/14  that she needs a refill on her estradiol- that pharmacy said they called and have been waiting 3 days. States she has 2 pills left. Please send to [facility] on [address].  Per chart review Dr. Vladimir Creeks notes states for patient to call in 3 months  And let us know if low dose is working,if it is will refill same dosage- it if is not working will consider changing dosage. Called Ann [name] and we discussed and she states this dosage is working well- informed her we would send refill for same dosage to her pharmacy. Will route to Dr. Ihor Dow.

## 2014-06-05 ENCOUNTER — Other Ambulatory Visit: Payer: Self-pay | Admitting: Internal Medicine

## 2014-06-05 MED ORDER — LISINOPRIL-HYDROCHLOROTHIAZIDE 10-12.5 MG PO TABS: 1.0000 | ORAL_TABLET | Freq: Every day | ORAL | Status: DC

## 2014-06-05 NOTE — Telephone Encounter (Signed)
Pt has f/u appt scheduled on 06/13/14 but is out of lisinopril-hydrochlorothiazide (PRINZIDE,ZESTORETIC) 10-12.5 MG per tablet, and is requesting refill be sent to [facility] on [address].

## 2014-06-05 NOTE — Telephone Encounter (Signed)
Rx refill send to [place]

## 2014-06-13 ENCOUNTER — Ambulatory Visit: Payer: Medicaid Other | Admitting: Internal Medicine

## 2014-06-23 ENCOUNTER — Encounter: Payer: Self-pay | Admitting: Internal Medicine

## 2014-06-23 ENCOUNTER — Ambulatory Visit: Payer: Medicaid Other | Attending: Internal Medicine | Admitting: Internal Medicine

## 2014-06-23 VITALS — BP 110/77 | HR 86 | Temp 98.0°F | Resp 16 | Wt 218.4 lb

## 2014-06-23 DIAGNOSIS — Z7982 Long term (current) use of aspirin: Secondary | ICD-10-CM | POA: Insufficient documentation

## 2014-06-23 DIAGNOSIS — F419 Anxiety disorder, unspecified: Secondary | ICD-10-CM | POA: Diagnosis not present

## 2014-06-23 DIAGNOSIS — Z23 Encounter for immunization: Secondary | ICD-10-CM | POA: Insufficient documentation

## 2014-06-23 DIAGNOSIS — I10 Essential (primary) hypertension: Secondary | ICD-10-CM | POA: Diagnosis present

## 2014-06-23 DIAGNOSIS — Z79899 Other long term (current) drug therapy: Secondary | ICD-10-CM | POA: Diagnosis not present

## 2014-06-23 DIAGNOSIS — K219 Gastro-esophageal reflux disease without esophagitis: Secondary | ICD-10-CM | POA: Diagnosis not present

## 2014-06-23 MED ORDER — LISINOPRIL-HYDROCHLOROTHIAZIDE 10-12.5 MG PO TABS: 1.0000 | ORAL_TABLET | Freq: Every day | ORAL | Status: DC

## 2014-06-23 NOTE — Progress Notes (Signed)
MRN: [id number] Name: Ann [name]  Sex: female Age: 54 y.o. DOB: 01/18/1961  Allergies: Review of patient's allergies indicates no known allergies.  Chief Complaint  Patient presents with  . Follow-up    HPI: Patient is 54 y.o. female who has history of hypertension comes today for followup , she denies any acute symptoms, she is requesting refill on her blood pressure medication, patient would like to get a flu shot today.patient is up-to-date with her colonoscopy and will schedule mammogram.   Past Medical History  Diagnosis Date  . Headache(784.0)   . Hypertension   . Anxiety   . Dysrhythmia     palpitations  . Anemia   . GERD (gastroesophageal reflux disease)     Past Surgical History  Procedure Laterality Date  . Colposcopy    . Foot surgery      right 5th toe-bone removed [id number]  . Abdominal hysterectomy N/A 03/23/2013    Procedure: HYSTERECTOMY ABDOMINAL;  Surgeon: Lavonia Drafts, MD;  Location: [facility];  Service: Gynecology;  Laterality: N/A;  . Bilateral salpingectomy Bilateral 03/23/2013    Procedure: BILATERAL SALPINGECTOMY;  Surgeon: Lavonia Drafts, MD;  Location: [facility];  Service: Gynecology;  Laterality: Bilateral;      Medication List       This list is accurate as of: 06/23/14  5:08 PM.  Always use your most recent med list.               acetaminophen 500 MG tablet  Commonly known as:  TYLENOL  Take 1,000 mg by mouth every 6 (six) hours as needed (for headache).     aspirin EC 81 MG tablet  Take 81 mg by mouth daily.     estradiol 1 MG tablet  Commonly known as:  ESTRACE  Take 1 tablet (1 mg total) by mouth daily.     gabapentin 300 MG capsule  Commonly known as:  NEURONTIN  Take 300 mg by mouth at bedtime as needed.     hydrocortisone 2.5 % cream  Apply topically as needed (FOR ECZEMA).     ibuprofen 600 MG tablet  Commonly known as:  ADVIL,MOTRIN  Take 1 tablet (600 mg total) by mouth every 8 (eight) hours as  needed.     lisinopril-hydrochlorothiazide 10-12.5 MG per tablet  Commonly known as:  PRINZIDE,ZESTORETIC  Take 1 tablet by mouth daily.     LORazepam 0.5 MG tablet  Commonly known as:  ATIVAN  Take 1 tablet (0.5 mg total) by mouth as needed for anxiety or sleep.     ranitidine 150 MG capsule  Commonly known as:  ZANTAC  Take 1 capsule (150 mg total) by mouth daily.     sertraline 25 MG tablet  Commonly known as:  ZOLOFT  Take 1 tablet (25 mg total) by mouth daily.        Meds ordered this encounter  Medications  . lisinopril-hydrochlorothiazide (PRINZIDE,ZESTORETIC) 10-12.5 MG per tablet    Sig: Take 1 tablet by mouth daily.    Dispense:  90 tablet    Refill:  2    **Patient requests 90 days supply**    Immunization History  Administered Date(s) Administered  . PPD Test 11/24/2012  . Tdap 11/17/2012    Family History  Problem Relation Age of Onset  . Arthritis Mother   . Stroke Mother   . Hypertension Mother   . Arthritis Father   . Cancer Father     prostate  . Stroke Father   .  Alcohol abuse Brother   . Stroke Brother   . Cancer Paternal Aunt     breast and uterine  . Colon cancer Neg Hx     History  Substance Use Topics  . Smoking status: Never Smoker   . Smokeless tobacco: Never Used  . Alcohol Use: Yes     Comment: occ    Review of Systems   As noted in HPI  Filed Vitals:   06/23/14 1654  BP: 110/77  Pulse: 86  Temp: 98 F (36.7 C)  Resp: 16    Physical Exam  Physical Exam  Constitutional: No distress.  Eyes: EOM are normal. Pupils are equal, round, and reactive to light.  Cardiovascular: Regular rhythm.   Pulmonary/Chest: Breath sounds normal. No respiratory distress. She has no wheezes. She has no rales.    CBC    Component Value Date/Time   WBC 4.3 12/26/2013 0943   RBC 4.65 12/26/2013 0943   HGB 13.8 12/26/2013 0943   HCT 41.3 12/26/2013 0943   PLT 213 12/26/2013 0943   MCV 88.8 12/26/2013 0943   LYMPHSABS 1.3  12/26/2013 0943   MONOABS 0.3 12/26/2013 0943   EOSABS 0.0 12/26/2013 0943   BASOSABS 0.0 12/26/2013 0943    CMP     Component Value Date/Time   NA 140 12/26/2013 0943   K 3.7 12/26/2013 0943   CL 102 12/26/2013 0943   CO2 26 12/26/2013 0943   GLUCOSE 90 12/26/2013 0943   BUN 12 12/26/2013 0943   CREATININE 0.97 12/26/2013 0943   CREATININE 0.96 11/18/2013 0936   CALCIUM 9.2 12/26/2013 0943   PROT 7.3 12/26/2013 0943   ALBUMIN 3.6 12/26/2013 0943   AST 18 12/26/2013 0943   ALT 10 12/26/2013 0943   ALKPHOS 47 12/26/2013 0943   BILITOT 0.6 12/26/2013 0943   GFRNONAA 66* 12/26/2013 0943   GFRNONAA 69 11/18/2013 0936   GFRAA 77* 12/26/2013 0943   GFRAA 79 11/18/2013 0936    Lab Results  Component Value Date/Time   CHOL 167 11/17/2012 10:01 AM    No components found for: HGA1C  Lab Results  Component Value Date/Time   AST 18 12/26/2013 09:43 AM    Assessment and Plan  Essential hypertension - Plan: blood pressure is well-controlled continued current medication lisinopril-hydrochlorothiazide (PRINZIDE,ZESTORETIC) 10-12.5 MG per tablet, COMPLETE METABOLIC PANEL WITH GFR  Needs flu shot Flu shot given today  Health Maintenance -Colonoscopy: uptodate   -Mammogram: patient is due in march and will schedule  -Vaccinations:  Flu shot today   Return in about 4 months (around 10/22/2014) for hypertension.  Lorayne Marek, MD

## 2014-06-23 NOTE — Progress Notes (Signed)
Patient here for follow up on her HTN Requesting refill on her medications as well

## 2014-06-24 LAB — COMPLETE METABOLIC PANEL WITH GFR
ALBUMIN: 3.9 g/dL (ref 3.5–5.2)
ALT: 10 U/L (ref 0–35)
AST: 18 U/L (ref 0–37)
Alkaline Phosphatase: 57 U/L (ref 39–117)
BUN: 15 mg/dL (ref 6–23)
CALCIUM: 9.5 mg/dL (ref 8.4–10.5)
CHLORIDE: 101 meq/L (ref 96–112)
CO2: 29 meq/L (ref 19–32)
Creat: 0.94 mg/dL (ref 0.50–1.10)
GFR, EST AFRICAN AMERICAN: 81 mL/min
GFR, EST NON AFRICAN AMERICAN: 70 mL/min
GLUCOSE: 90 mg/dL (ref 70–99)
POTASSIUM: 3.9 meq/L (ref 3.5–5.3)
SODIUM: 137 meq/L (ref 135–145)
TOTAL PROTEIN: 7.4 g/dL (ref 6.0–8.3)
Total Bilirubin: 0.4 mg/dL (ref 0.2–1.2)

## 2014-06-30 ENCOUNTER — Telehealth: Payer: Self-pay

## 2014-06-30 NOTE — Telephone Encounter (Signed)
Patient is aware of her lab results 

## 2014-06-30 NOTE — Telephone Encounter (Signed)
-----   Message from Lorayne Marek, MD sent at 06/26/2014 10:56 AM EST ----- Call and let the Patient know that blood work is normal.

## 2014-07-05 ENCOUNTER — Encounter: Payer: Self-pay | Admitting: Obstetrics & Gynecology

## 2014-07-05 ENCOUNTER — Ambulatory Visit (INDEPENDENT_AMBULATORY_CARE_PROVIDER_SITE_OTHER): Payer: Medicaid Other | Admitting: Obstetrics & Gynecology

## 2014-07-05 VITALS — BP 126/65 | HR 64 | Temp 98.6°F | Ht 62.0 in | Wt 216.6 lb

## 2014-07-05 DIAGNOSIS — N898 Other specified noninflammatory disorders of vagina: Secondary | ICD-10-CM | POA: Diagnosis not present

## 2014-07-05 DIAGNOSIS — R232 Flushing: Secondary | ICD-10-CM | POA: Diagnosis not present

## 2014-07-05 MED ORDER — ESTRADIOL 1 MG PO TABS: 1.0000 mg | ORAL_TABLET | Freq: Every day | ORAL | Status: DC

## 2014-07-05 NOTE — Patient Instructions (Signed)
GO WHITE: Soap: UNSCENTED Dove (white box light green writing) Laundry detergent (underwear)- Dreft or [employer] unscented WHITE 100% cotton panties (NOT just cotton crouch) Sanitary napkin/panty liners: UNSCENTED.  If it doesn't SAY unscented it can have a scent/perfume    NO PERFUMES OR LOTIONS OR POTIONS in the vulvar area (may use regular KY) Condoms: hypoallergenic only. Non dyed (no color) Toilet papers: white only Wash clothes: use a separate wash cloth. WHITE.  Washed in Dreft.  

## 2014-07-05 NOTE — Progress Notes (Signed)
Subjective:     Patient ID: Ann [name], female   DOB: May 26, 1960, 54 y.o.   MRN: [id number]  HPI Pt reports that she had 'yeast infections back to back' which has now resolved after treatment with monistat. She is currently on Estradiol with improved sx.  She wants to keep her dose where it is.      Review of Systems     Objective:   Physical Exam BP 126/65 mmHg  Pulse 64  Temp(Src) 98.6 F (37 C) (Oral)  Ht 5\' 2"  (1.575 m)  Wt 216 lb 9.6 oz (98.249 kg)  BMI 39.61 kg/m2  LMP 10/03/2012  Exam deferred    Assessment:     Leukorrhea of undetermined significance-  Now resolved   f/u ERT- stable on current regimen  Plan:     Screening mammogram Increase fiber in diet GO WHITE F/u in 1 year or sooner prn Estradiol 1 mg  Walk 6 flights of step at work (gradual)

## 2014-07-05 NOTE — Progress Notes (Signed)
Mammogram scheduled for 3/28

## 2014-07-28 ENCOUNTER — Ambulatory Visit: Payer: Medicaid Other | Admitting: Internal Medicine

## 2014-08-03 ENCOUNTER — Ambulatory Visit: Payer: Medicaid Other | Admitting: Internal Medicine

## 2014-08-04 ENCOUNTER — Ambulatory Visit: Payer: Medicaid Other | Admitting: Internal Medicine

## 2014-08-07 ENCOUNTER — Ambulatory Visit (HOSPITAL_COMMUNITY)
Admission: RE | Admit: 2014-08-07 | Discharge: 2014-08-07 | Disposition: A | Discharge status: Home / Self Care | Payer: Medicaid Other | Source: Ambulatory Visit | Attending: Obstetrics & Gynecology | Admitting: Obstetrics & Gynecology

## 2014-08-07 DIAGNOSIS — Z1231 Encounter for screening mammogram for malignant neoplasm of breast: Secondary | ICD-10-CM | POA: Diagnosis present

## 2014-08-07 DIAGNOSIS — R232 Flushing: Secondary | ICD-10-CM

## 2014-11-10 ENCOUNTER — Telehealth: Payer: Self-pay | Admitting: Internal Medicine

## 2014-11-10 ENCOUNTER — Telehealth: Payer: Self-pay

## 2014-11-10 NOTE — Telephone Encounter (Signed)
Patient called and left a message in the beginning of June asking to come in for a BP check only.  Please f/u with patient.

## 2014-11-14 ENCOUNTER — Ambulatory Visit: Payer: 59 | Attending: Internal Medicine | Admitting: Internal Medicine

## 2014-11-14 ENCOUNTER — Encounter: Payer: Self-pay | Admitting: Internal Medicine

## 2014-11-14 VITALS — BP 114/78 | HR 71 | Temp 98.0°F | Resp 16 | Wt 220.6 lb

## 2014-11-14 DIAGNOSIS — Z79899 Other long term (current) drug therapy: Secondary | ICD-10-CM | POA: Diagnosis not present

## 2014-11-14 DIAGNOSIS — M542 Cervicalgia: Secondary | ICD-10-CM

## 2014-11-14 DIAGNOSIS — M62838 Other muscle spasm: Secondary | ICD-10-CM | POA: Diagnosis not present

## 2014-11-14 DIAGNOSIS — I10 Essential (primary) hypertension: Secondary | ICD-10-CM | POA: Diagnosis not present

## 2014-11-14 DIAGNOSIS — K219 Gastro-esophageal reflux disease without esophagitis: Secondary | ICD-10-CM | POA: Diagnosis not present

## 2014-11-14 DIAGNOSIS — Z7982 Long term (current) use of aspirin: Secondary | ICD-10-CM | POA: Insufficient documentation

## 2014-11-14 DIAGNOSIS — F419 Anxiety disorder, unspecified: Secondary | ICD-10-CM | POA: Diagnosis not present

## 2014-11-14 MED ORDER — CYCLOBENZAPRINE HCL 5 MG PO TABS: 5.0000 mg | ORAL_TABLET | Freq: Every evening | ORAL | Status: DC | PRN

## 2014-11-14 NOTE — Progress Notes (Signed)
Patient here for follow up on her blood pressure Complains of having some pressure behind both eyes and  Some pain to her temple areas Patient does not think it is related to her blood pressure but wanted It checked

## 2014-11-14 NOTE — Progress Notes (Signed)
MRN: [id number] Name: Ann [name]  Sex: female Age: 54 y.o. DOB: 1960-10-19  Allergies: Review of patient's allergies indicates no known allergies.  Chief Complaint  Patient presents with  . Follow-up    HPI: Patient is 54 y.o. female who has history of neck arthritis, cervicalgia, hypertension comes today for followup reported to have some neck discomfort and temporal pain she does ago patient did not take any medication which has improved, as per patient she traveled recently and reported these symptoms got worse denies currently any headache dizziness chest and shortness of breath, patient has been taking her blood pressure medication and her blood pressure is controlled denies any ear pain or discharge.  Has still some neck discomfort.  Past Medical History  Diagnosis Date  . Headache(784.0)   . Hypertension   . Anxiety   . Dysrhythmia     palpitations  . Anemia   . GERD (gastroesophageal reflux disease)     Past Surgical History  Procedure Laterality Date  . Colposcopy    . Foot surgery      right 5th toe-bone removed [id number]  . Abdominal hysterectomy N/A 03/23/2013    Procedure: HYSTERECTOMY ABDOMINAL;  Surgeon: Lavonia Drafts, MD;  Location: [facility];  Service: Gynecology;  Laterality: N/A;  . Bilateral salpingectomy Bilateral 03/23/2013    Procedure: BILATERAL SALPINGECTOMY;  Surgeon: Lavonia Drafts, MD;  Location: [facility];  Service: Gynecology;  Laterality: Bilateral;      Medication List       This list is accurate as of: 11/14/14 11:54 AM.  Always use your most recent med list.               acetaminophen 500 MG tablet  Commonly known as:  TYLENOL  Take 1,000 mg by mouth every 6 (six) hours as needed (for headache).     aspirin EC 81 MG tablet  Take 81 mg by mouth daily.     cyclobenzaprine 5 MG tablet  Commonly known as:  FLEXERIL  Take 1 tablet (5 mg total) by mouth at bedtime as needed for muscle spasms.     estradiol 1 MG tablet   Commonly known as:  ESTRACE  Take 1 tablet (1 mg total) by mouth daily.     gabapentin 300 MG capsule  Commonly known as:  NEURONTIN  Take 300 mg by mouth at bedtime as needed.     hydrocortisone 2.5 % cream  Apply topically as needed (FOR ECZEMA).     ibuprofen 600 MG tablet  Commonly known as:  ADVIL,MOTRIN  Take 1 tablet (600 mg total) by mouth every 8 (eight) hours as needed.     lisinopril-hydrochlorothiazide 10-12.5 MG per tablet  Commonly known as:  PRINZIDE,ZESTORETIC  Take 1 tablet by mouth daily.     LORazepam 0.5 MG tablet  Commonly known as:  ATIVAN  Take 1 tablet (0.5 mg total) by mouth as needed for anxiety or sleep.     ranitidine 150 MG capsule  Commonly known as:  ZANTAC  Take 1 capsule (150 mg total) by mouth daily.     sertraline 25 MG tablet  Commonly known as:  ZOLOFT  Take 1 tablet (25 mg total) by mouth daily.        Meds ordered this encounter  Medications  . cyclobenzaprine (FLEXERIL) 5 MG tablet    Sig: Take 1 tablet (5 mg total) by mouth at bedtime as needed for muscle spasms.    Dispense:  30 tablet  Refill:  1    Immunization History  Administered Date(s) Administered  . Influenza,inj,Quad PF,36+ Mos 06/23/2014  . PPD Test 11/24/2012  . Tdap 11/17/2012    Family History  Problem Relation Age of Onset  . Arthritis Mother   . Stroke Mother   . Hypertension Mother   . Arthritis Father   . Cancer Father     prostate  . Stroke Father   . Alcohol abuse Brother   . Stroke Brother   . Cancer Paternal Aunt     breast and uterine  . Colon cancer Neg Hx     History  Substance Use Topics  . Smoking status: Never Smoker   . Smokeless tobacco: Never Used  . Alcohol Use: Yes     Comment: occ    Review of Systems   As noted in HPI  Filed Vitals:   11/14/14 1139  BP: 114/78  Pulse: 71  Temp: 98 F (36.7 C)  Resp: 16    Physical Exam  Physical Exam  Constitutional: No distress.  HENT:  Both auditory canals with  minimal wax, both TMs visualized not congested, no tenderness on TMJ or temporal area.  Eyes: EOM are normal. Pupils are equal, round, and reactive to light.  Cardiovascular: Normal rate and regular rhythm.   Pulmonary/Chest: Breath sounds normal. No respiratory distress. She has no wheezes. She has no rales.  Musculoskeletal: She exhibits no edema.  Equal strength all extremities    CBC    Component Value Date/Time   WBC 4.3 12/26/2013 0943   RBC 4.65 12/26/2013 0943   HGB 13.8 12/26/2013 0943   HCT 41.3 12/26/2013 0943   PLT 213 12/26/2013 0943   MCV 88.8 12/26/2013 0943   LYMPHSABS 1.3 12/26/2013 0943   MONOABS 0.3 12/26/2013 0943   EOSABS 0.0 12/26/2013 0943   BASOSABS 0.0 12/26/2013 0943    CMP     Component Value Date/Time   NA 137 06/23/2014 1710   K 3.9 06/23/2014 1710   CL 101 06/23/2014 1710   CO2 29 06/23/2014 1710   GLUCOSE 90 06/23/2014 1710   BUN 15 06/23/2014 1710   CREATININE 0.94 06/23/2014 1710   CREATININE 0.97 12/26/2013 0943   CALCIUM 9.5 06/23/2014 1710   PROT 7.4 06/23/2014 1710   ALBUMIN 3.9 06/23/2014 1710   AST 18 06/23/2014 1710   ALT 10 06/23/2014 1710   ALKPHOS 57 06/23/2014 1710   BILITOT 0.4 06/23/2014 1710   GFRNONAA 70 06/23/2014 1710   GFRNONAA 66* 12/26/2013 0943   GFRAA 81 06/23/2014 1710   GFRAA 77* 12/26/2013 0943    Lab Results  Component Value Date/Time   CHOL 167 11/17/2012 10:01 AM    Lab Results  Component Value Date/Time   HGBA1C 5.3 11/17/2012 10:01 AM    Lab Results  Component Value Date/Time   AST 18 06/23/2014 05:10 PM    Assessment and Plan  Essential hypertension Blood pressure is well controlled, continue with current meds.We'll check her blood chemistry on the following visit.  Cervicalgia Ibuprofen when necessary.  Muscle spasm - Plan: cyclobenzaprine (FLEXERIL) 5 MG tablet, each bedtime as needed  Return in about 3 months (around 02/14/2015), or if symptoms worsen or fail to improve.   This  note has been created with Surveyor, quantity. Any transcriptional errors are unintentional.    Lorayne Marek, MD

## 2015-02-09 ENCOUNTER — Ambulatory Visit: Payer: 59 | Admitting: Obstetrics & Gynecology

## 2015-02-26 ENCOUNTER — Ambulatory Visit: Payer: 59 | Admitting: Obstetrics and Gynecology

## 2015-04-16 IMAGING — CT CT HEAD W/O CM
1 series · 16 of 30 positions shown, 20 images · non-contrast
Comparison: DG CERVICAL SPINE COMPLETE dated 08/18/2012; MR HEAD W/O
CM dated 07/11/2010

CLINICAL DATA: Headache

EXAM:
CT HEAD WITHOUT CONTRAST
TECHNIQUE: Contiguous axial images were obtained from the base of the skull
through the vertex without intravenous contrast.

[Series 2: head 5.0 h30s · axial · 0.43mm/px · z∈[+1086,+1221]mm · 16 of 31 slices shown, 20 images]
[im 2/31  brain]
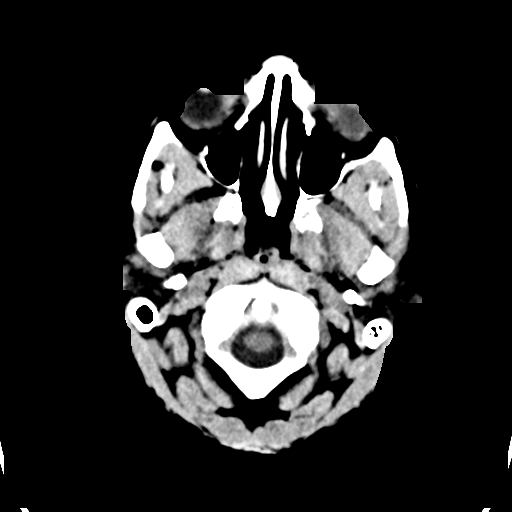
[im 2/31  bone]
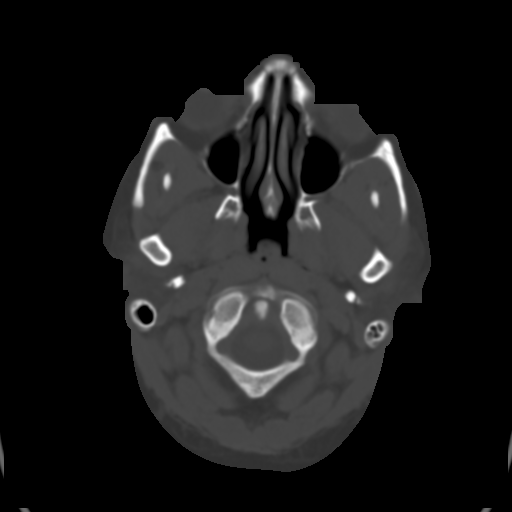
[im 4/31  brain]
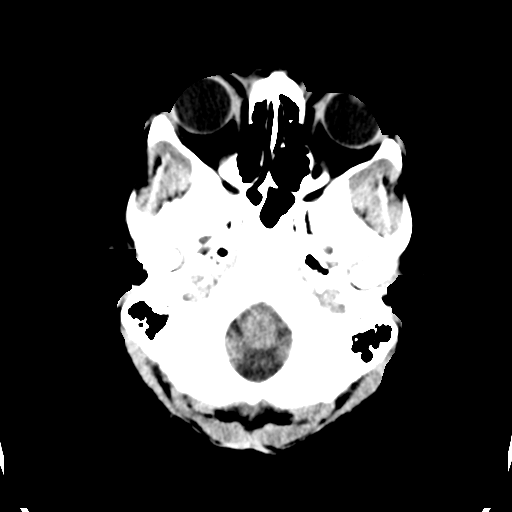
[im 6/31  brain]
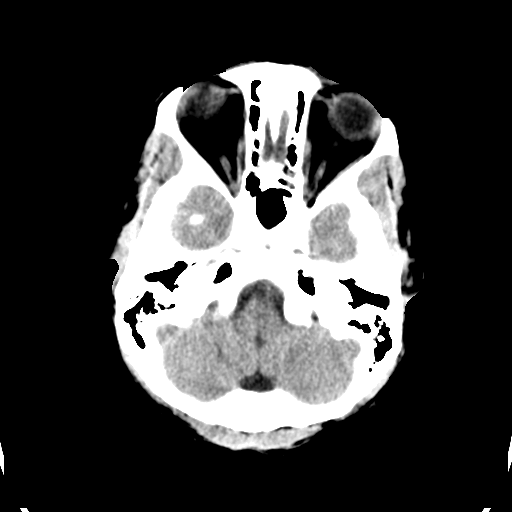
[im 8/31  brain]
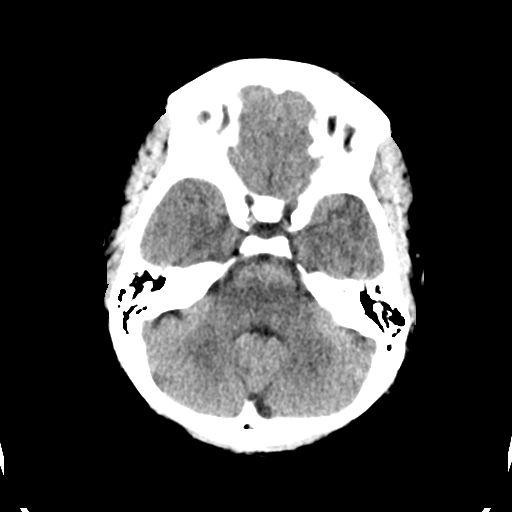
[im 9/31  brain]
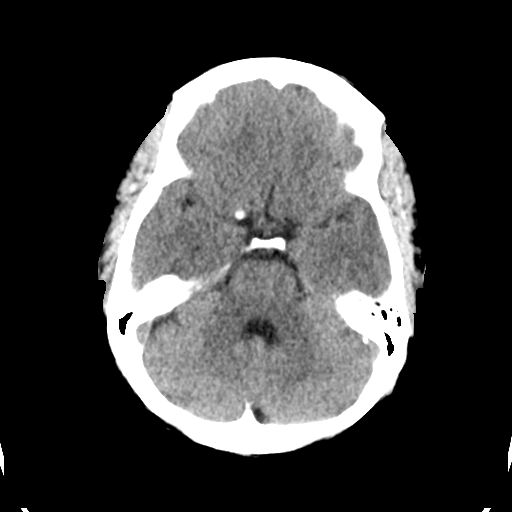
[im 9/31  bone]
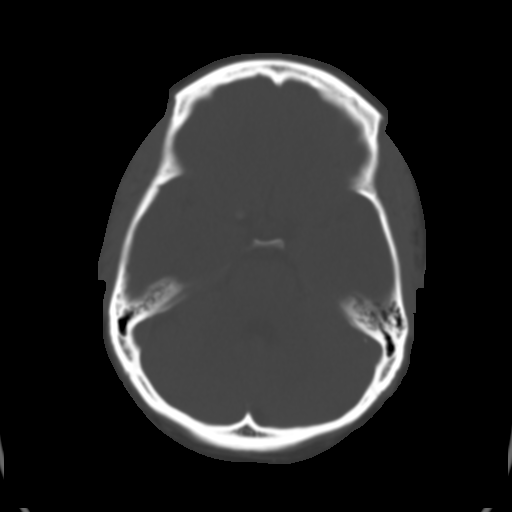
[im 11/31  brain]
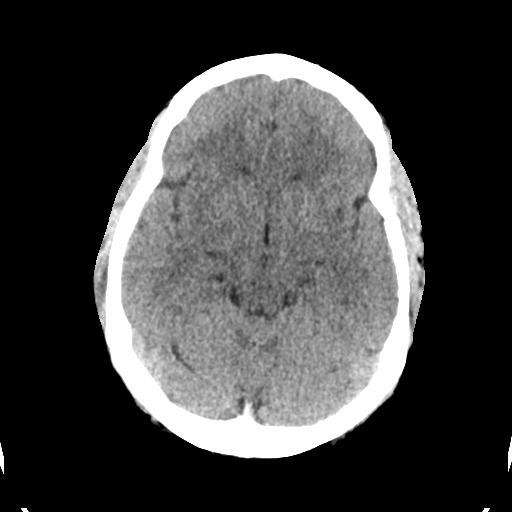
[im 13/31  brain]
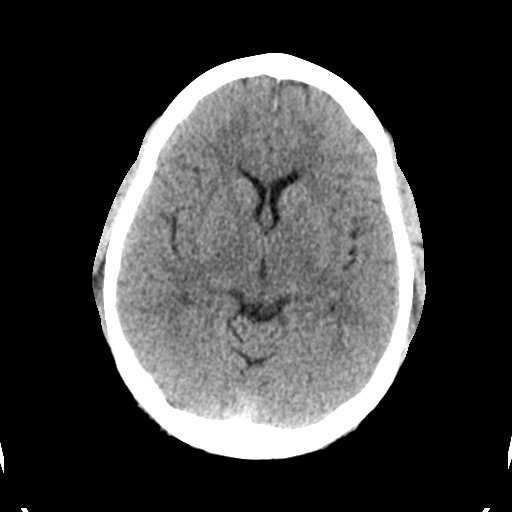
[im 15/31  brain]
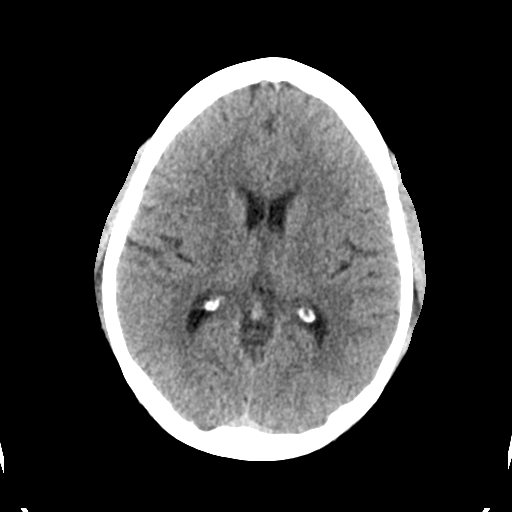
[im 16/31  brain]
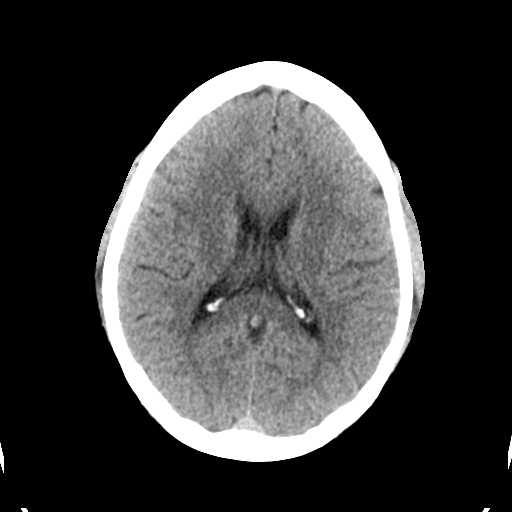
[im 16/31  bone]
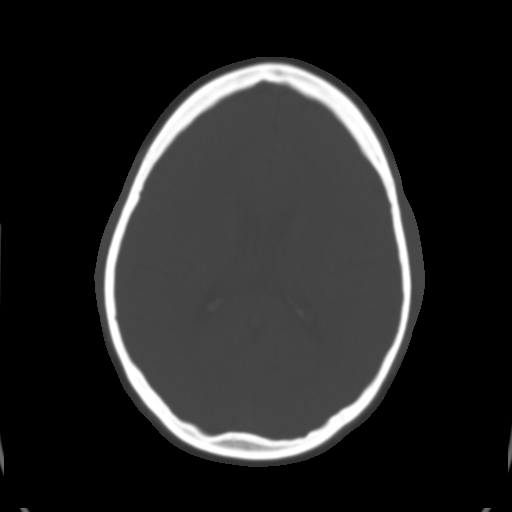
[im 18/31  brain]
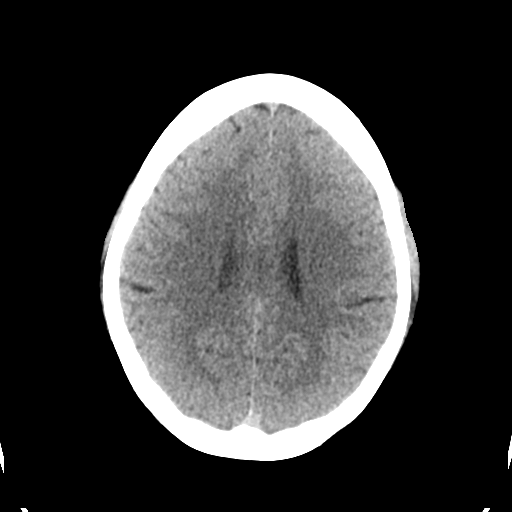
[im 20/31  brain]
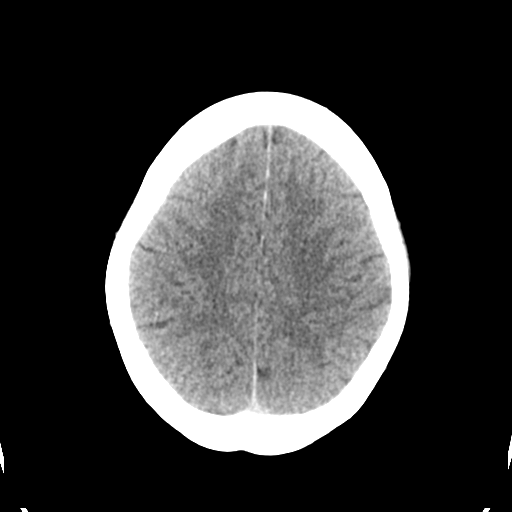
[im 22/31  brain]
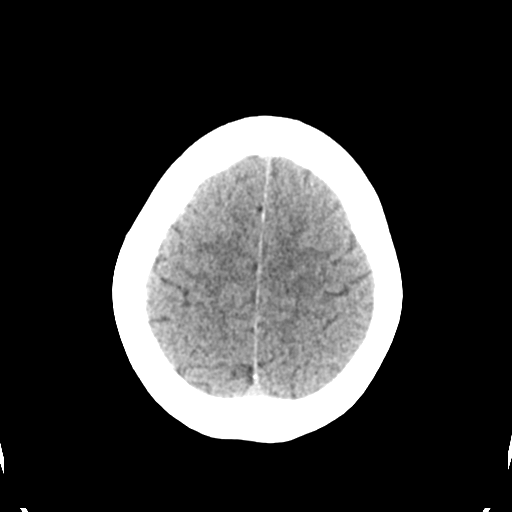
[im 23/31  brain]
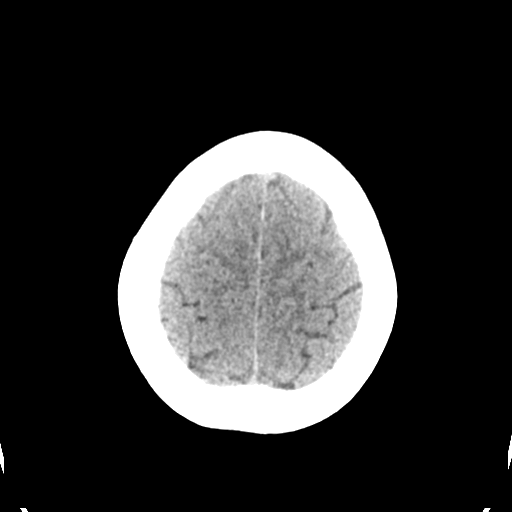
[im 23/31  bone]
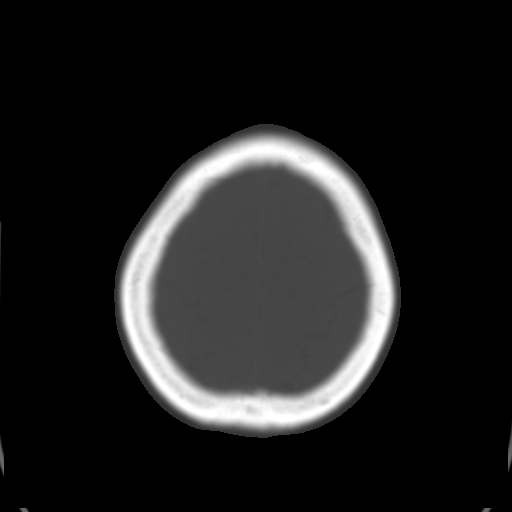
[im 25/31  brain]
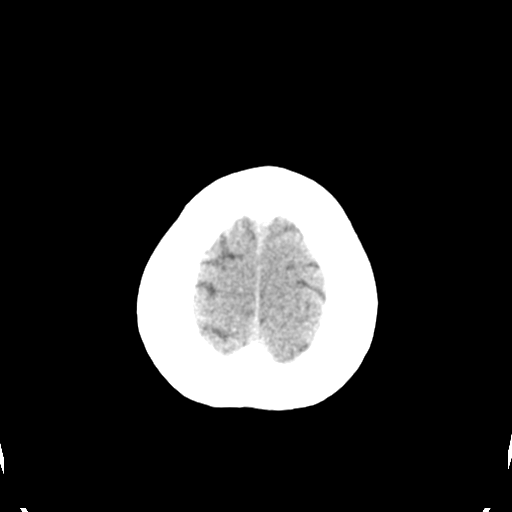
[im 27/31  brain]
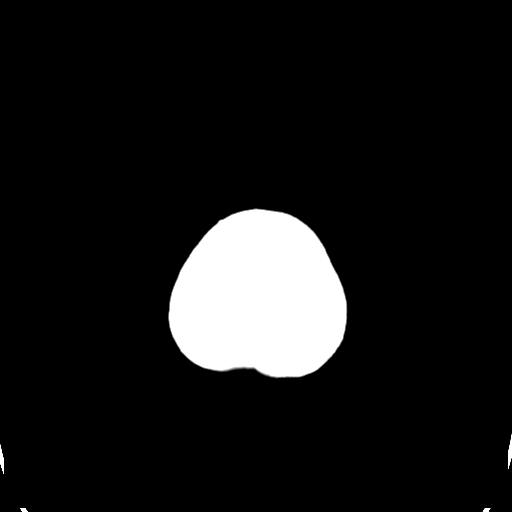
[im 29/31  brain]
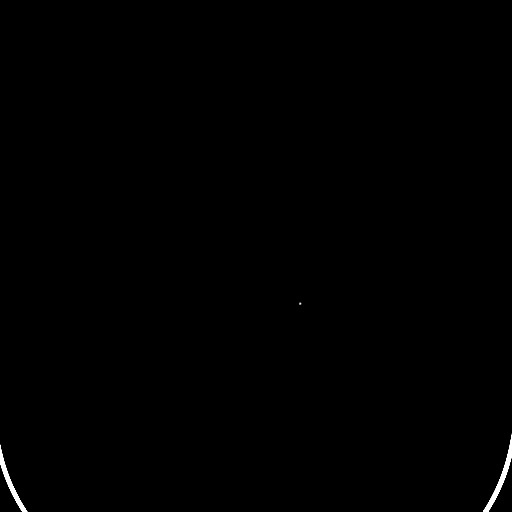

[16 of 30 positions shown; findings below may reference images not displayed]

FINDINGS: No acute intracranial abnormality. Specifically, no hemorrhage,
hydrocephalus, mass lesion, acute infarction, or significant
intracranial injury. No acute calvarial abnormality. Visualized
paranasal sinuses and mastoid air cells are patent.
IMPRESSION: No acute intracranial abnormality.

## 2015-05-08 NOTE — Telephone Encounter (Signed)
error 

## 2015-07-18 ENCOUNTER — Telehealth: Payer: Self-pay | Admitting: *Deleted

## 2015-07-18 DIAGNOSIS — R232 Flushing: Secondary | ICD-10-CM

## 2015-07-18 NOTE — Telephone Encounter (Signed)
Pt called requesting a refill on estradiol. Her last visit was Feb 2016. Will send message to Dr. Ihor Dow asking for refill authorization.

## 2015-07-19 MED ORDER — ESTRADIOL 1 MG PO TABS: 1.0000 mg | ORAL_TABLET | Freq: Every day | ORAL | Status: DC

## 2015-07-19 NOTE — Addendum Note (Signed)
Addended by: Christiana Pellant A on: 07/19/2015 08:52 AM   Modules accepted: Orders

## 2015-07-19 NOTE — Telephone Encounter (Signed)
rx sent to pharmacy and called patient asking her to call back and make an appointment.

## 2015-08-10 ENCOUNTER — Ambulatory Visit: Payer: Medicaid Other | Admitting: Obstetrics & Gynecology

## 2015-08-23 ENCOUNTER — Ambulatory Visit: Payer: Medicaid Other | Admitting: Obstetrics & Gynecology

## 2015-09-12 ENCOUNTER — Ambulatory Visit: Payer: Medicaid Other | Admitting: Obstetrics & Gynecology

## 2015-10-05 ENCOUNTER — Encounter: Payer: Self-pay | Admitting: Obstetrics & Gynecology

## 2015-10-05 ENCOUNTER — Ambulatory Visit (INDEPENDENT_AMBULATORY_CARE_PROVIDER_SITE_OTHER): Payer: Self-pay | Admitting: Obstetrics & Gynecology

## 2015-10-05 VITALS — BP 131/79 | HR 71 | Wt 226.0 lb

## 2015-10-05 DIAGNOSIS — E669 Obesity, unspecified: Secondary | ICD-10-CM

## 2015-10-05 DIAGNOSIS — Z638 Other specified problems related to primary support group: Secondary | ICD-10-CM

## 2015-10-05 DIAGNOSIS — N951 Menopausal and female climacteric states: Secondary | ICD-10-CM

## 2015-10-05 DIAGNOSIS — F439 Reaction to severe stress, unspecified: Secondary | ICD-10-CM

## 2015-10-05 NOTE — Patient Instructions (Signed)
Menopause Menopause is the normal time of life when menstrual periods stop completely. Menopause is complete when you have missed 12 consecutive menstrual periods. It usually occurs between the ages of 48 years and 55 years. Very rarely does a woman develop menopause before the age of 40 years. At menopause, your ovaries stop producing the female hormones estrogen and progesterone. This can cause undesirable symptoms and also affect your health. Sometimes the symptoms may occur 4-5 years before the menopause begins. There is no relationship between menopause and:  Oral contraceptives.  Number of children you had.  Race.  The age your menstrual periods started (menarche). Heavy smokers and very thin women may develop menopause earlier in life. CAUSES  The ovaries stop producing the female hormones estrogen and progesterone.  Other causes include:  Surgery to remove both ovaries.  The ovaries stop functioning for no known reason.  Tumors of the pituitary gland in the brain.  Medical disease that affects the ovaries and hormone production.  Radiation treatment to the abdomen or pelvis.  Chemotherapy that affects the ovaries. SYMPTOMS   Hot flashes.  Night sweats.  Decrease in sex drive.  Vaginal dryness and thinning of the vagina causing painful intercourse.  Dryness of the skin and developing wrinkles.  Headaches.  Tiredness.  Irritability.  Memory problems.  Weight gain.  Bladder infections.  Hair growth of the face and chest.  Infertility. More serious symptoms include:  Loss of bone (osteoporosis) causing breaks (fractures).  Depression.  Hardening and narrowing of the arteries (atherosclerosis) causing heart attacks and strokes. DIAGNOSIS   When the menstrual periods have stopped for 12 straight months.  Physical exam.  Hormone studies of the blood. TREATMENT  There are many treatment choices and nearly as many questions about them. The  decisions to treat or not to treat menopausal changes is an individual choice made with your health care provider. Your health care provider can discuss the treatments with you. Together, you can decide which treatment will work best for you. Your treatment choices may include:   Hormone therapy (estrogen and progesterone).  Non-hormonal medicines.  Treating the individual symptoms with medicine (for example antidepressants for depression).  Herbal medicines that may help specific symptoms.  Counseling by a psychiatrist or psychologist.  Group therapy.  Lifestyle changes including:  Eating healthy.  Regular exercise.  Limiting caffeine and alcohol.  Stress management and meditation.  No treatment. HOME CARE INSTRUCTIONS   Take the medicine your health care provider gives you as directed.  Get plenty of sleep and rest.  Exercise regularly.  Eat a diet that contains calcium (good for the bones) and soy products (acts like estrogen hormone).  Avoid alcoholic beverages.  Do not smoke.  If you have hot flashes, dress in layers.  Take supplements, calcium, and vitamin D to strengthen bones.  You can use over-the-counter lubricants or moisturizers for vaginal dryness.  Group therapy is sometimes very helpful.  Acupuncture may be helpful in some cases. SEEK MEDICAL CARE IF:   You are not sure you are in menopause.  You are having menopausal symptoms and need advice and treatment.  You are still having menstrual periods after age 55 years.  You have pain with intercourse.  Menopause is complete (no menstrual period for 12 months) and you develop vaginal bleeding.  You need a referral to a specialist (gynecologist, psychiatrist, or psychologist) for treatment. SEEK IMMEDIATE MEDICAL CARE IF:   You have severe depression.  You have excessive vaginal bleeding.    You fell and think you have a broken bone.  You have pain when you urinate.  You develop leg or  chest pain.  You have a fast pounding heart beat (palpitations).  You have severe headaches.  You develop vision problems.  You feel a lump in your breast.  You have abdominal pain or severe indigestion.   This information is not intended to replace advice given to you by your health care provider. Make sure you discuss any questions you have with your health care provider.   Document Released: 07/19/2003 Document Revised: 12/29/2012 Document Reviewed: 11/25/2012 Elsevier Interactive Patient Education 2016 [employer] Inc[employer]  

## 2015-10-05 NOTE — Progress Notes (Signed)
Patient ID: Ann [name], female   DOB: 1961/01/31, 55 y.o.   MRN: [id number] History:  55 y.o. [id number] here today for eval of menopausal sx. Pt was on EES but didn't feel like it was helping so stopped it.  Does not want to be on meds for menopause.  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  Feels like she is neglecting herself.  The following portions of the patient's history were reviewed and updated as appropriate: allergies, current medications, past family history, past medical history, past social history, past surgical history and problem list.  Review of Systems:  Pertinent items are noted in HPI.  Objective:  Physical Exam Blood pressure 131/79, pulse 71, weight 226 lb (102.513 kg), last menstrual period 10/03/2012. Gen: NAD Lungs: CTA CV: RRR   Assessment & Plan:  Hot flushes Weight management Life stressors  Does not want to cont EES purchase mini fan. Nonpharmacologic rec for menopausal sx F/u in 1 year or sooner prn Exercise- walking and meditation- daily  Ann [name], M.D., Ann [name]

## 2015-10-17 ENCOUNTER — Other Ambulatory Visit: Payer: Self-pay | Admitting: Obstetrics & Gynecology

## 2015-10-17 DIAGNOSIS — Z1231 Encounter for screening mammogram for malignant neoplasm of breast: Secondary | ICD-10-CM

## 2015-10-29 ENCOUNTER — Ambulatory Visit
Admission: RE | Admit: 2015-10-29 | Discharge: 2015-10-29 | Disposition: A | Discharge status: Home / Self Care | Payer: Medicaid Other | Source: Ambulatory Visit | Attending: Obstetrics & Gynecology | Admitting: Obstetrics & Gynecology

## 2015-10-29 DIAGNOSIS — Z1231 Encounter for screening mammogram for malignant neoplasm of breast: Secondary | ICD-10-CM

## 2016-01-02 ENCOUNTER — Telehealth: Payer: Self-pay | Admitting: *Deleted

## 2016-01-02 NOTE — Telephone Encounter (Addendum)
Pt left message yesterday stating that she thinks she needs to be seen in office to see Dr. Ihor Dow. She had partial hysterectomy and has had no problems until now. She is having pressure in her low abdomen. She denies d/c.

## 2016-01-11 NOTE — Telephone Encounter (Signed)
Spoke with patient and asked her if she was still having symptoms. Patient stated that the symptoms have resolved. However she needs to have a gyn well checkup. I told her I would have the front desk call her to schedule this. Understanding voiced.

## 2016-02-06 ENCOUNTER — Ambulatory Visit: Payer: Medicaid Other | Admitting: Obstetrics & Gynecology

## 2016-02-11 ENCOUNTER — Telehealth: Payer: Self-pay | Admitting: *Deleted

## 2016-02-11 NOTE — Telephone Encounter (Signed)
Ann [name] called this am and left a message she would like a nurse to call her back asap.States she is not sure what is going on.

## 2016-02-12 NOTE — Telephone Encounter (Signed)
Pt left additional message yesterday @ 1411 stating that she is feeling movement in her abdomen as if she is pregnant. She previously felt "flutters" some time ago but now is actual movement. She further stated that she has had a hysterectomy. She does not feel that she can wait until November to see Dr. Ihor Dow again. Please call back.

## 2016-02-12 NOTE — Telephone Encounter (Signed)
I called Ann [name] back and we discussed since she had her uterus and fallopian tubes removed she can not be pregnant.  We discussed she could be having some gi issues such as gas and I suggested she see her PCP provider.  She states she does have gas sometimes.  I also informed her we do not have any openings to see her sooner but she can call and see if there are cancellations on days she is able to come. She voices understanding.

## 2016-04-01 ENCOUNTER — Ambulatory Visit (INDEPENDENT_AMBULATORY_CARE_PROVIDER_SITE_OTHER): Payer: Medicaid Other | Admitting: Obstetrics & Gynecology

## 2016-04-01 ENCOUNTER — Encounter: Payer: Self-pay | Admitting: Obstetrics & Gynecology

## 2016-04-01 VITALS — BP 143/98 | HR 81 | Wt 227.3 lb

## 2016-04-01 DIAGNOSIS — N951 Menopausal and female climacteric states: Secondary | ICD-10-CM

## 2016-04-01 NOTE — Patient Instructions (Signed)
Menopause and Herbal Products Introduction WHAT IS MENOPAUSE? Menopause is the normal time of life when menstrual periods decrease in frequency and eventually stop completely. This process can take several years for some women. Menopause is complete when you have had an absence of menstruation for a full year since your last menstrual period. It usually occurs between the ages of 21 and 49. It is not common for menopause to begin before the age of 36. During menopause, your body stops producing the female hormones estrogen and progesterone. Common symptoms associated with this loss of hormones (vasomotor symptoms) are:  Hot flashes.  Hot flushes.  Night sweats. Other common symptoms and complications of menopause include:  Decrease in sex drive.  Vaginal dryness and thinning of the walls of the vagina. This can make sex painful.  Dryness of the skin and development of wrinkles.  Headaches.  Tiredness.  Irritability.  Memory problems.  Weight gain.  Bladder infections.  Hair growth on the face and chest.  Inability to reproduce offspring (infertility).  Loss of density in the bones (osteoporosis) increasing your risk for breaks (fractures).  Depression.  Hardening and narrowing of the arteries (atherosclerosis). This increases your risk of heart attack and stroke. WHAT TREATMENT OPTIONS ARE AVAILABLE? There are many treatment choices for menopause symptoms. The most common treatment is hormone replacement therapy. Many alternative therapies for menopause are emerging, including the use of herbal products. These supplements can be found in the form of herbs, teas, oils, tinctures, and pills. Common herbal supplements for menopause are made from plants that contain phytoestrogens. Phytoestrogens are compounds that occur naturally in plants and plant products. They act like estrogen in the body. Foods and herbs that contain phytoestrogens include:  Soy.  Flax seeds.  Red  clover.  Ginseng. WHAT MENOPAUSE SYMPTOMS MAY BE HELPED IF I USE HERBAL PRODUCTS?  Vasomotor symptoms. These may be helped by:  Soy. Some studies show that soy may have a moderate benefit for hot flashes.  Black cohosh. There is limited evidence indicating this may be beneficial for hot flashes.  Symptoms that are related to heart and blood vessel disease. These may be helped by soy. Studies have shown that soy can help to lower cholesterol.  Depression. This may be helped by:  St. John's wort. There is limited evidence that shows this may help mild to moderate depression.  Black cohosh. There is evidence that this may help depression and mood swings.  Osteoporosis. Soy may help to decrease bone loss that is associated with menopause and may prevent osteoporosis. Limited evidence indicates that red clover may offer some bone loss protection as well. Other herbal products that are commonly used during menopause lack enough evidence to support their use as a replacement for conventional menopause therapies. These products include evening primrose, ginseng, and red clover. WHAT ARE THE CASES WHEN HERBAL PRODUCTS SHOULD NOT BE USED DURING MENOPAUSE? Do not use herbal products during menopause without your health care provider's approval if:  You are taking medicine.  You have a preexisting liver condition. ARE THERE ANY RISKS IN MY TAKING HERBAL PRODUCTS DURING MENOPAUSE? If you choose to use herbal products to help with symptoms of menopause, keep in mind that:  Different supplements have different and unmeasured amounts of herbal ingredients.  Herbal products are not regulated the same way that medicines are.  Concentrations of herbs may vary depending on the way they are prepared. For example, the concentration may be different in a pill, tea, oil, and  tincture.  Little is known about the risks of using herbal products, particularly the risks of long-term use.  Some herbal  supplements can be harmful when combined with certain medicines. Most commonly reported side effects of herbal products are mild. However, if used improperly, many herbal supplements can cause serious problems. Talk to your health care provider before starting any herbal product. If problems develop, stop taking the supplement and let your health care provider know. This information is not intended to replace advice given to you by your health care provider. Make sure you discuss any questions you have with your health care provider. Document Released: 10/15/2007 Document Revised: 10/04/2015 Document Reviewed: 10/11/2013  2017 Elsevier

## 2016-04-01 NOTE — Progress Notes (Signed)
History:  55 y.o. [id number] here today for eval of pressure in lower abd for several weeks.  Pt denies change in bowel habits at the time. She reports that her sx have resolved for over 1 month. She does reports weight gain.     The following portions of the patient's history were reviewed and updated as appropriate: allergies, current medications, past family history, past medical history, past social history, past surgical history and problem list.  Review of Systems:  Pertinent items are noted in HPI.   Objective:  Physical Exam Blood pressure (!) 143/98, pulse 81, weight 227 lb 4.8 oz (103.1 kg), last menstrual period 10/03/2012. BP (!) 143/98   Pulse 81   Wt 227 lb 4.8 oz (103.1 kg)   LMP 10/03/2012   BMI 41.57 kg/m  CONSTITUTIONAL: Well-developed, well-nourished female in no acute distress.  HENT:  Normocephalic, atraumatic EYES: Conjunctivae and EOM are normal. No scleral icterus.  NECK: Normal range of motion SKIN: Skin is warm and dry. No rash noted. Not diaphoretic.No pallor. [place]: Alert and oriented to person, place, and time. Normal coordination.  Abd: Soft, nontender and nondistended; no rebound or guarding Pelvic: deferred   Assessment & Plan:  abd pressure- resolved x 1 month Post menopausal state- declines pharmacologic management  F/u in 3 months Reviewed diet and exercise  Jasreet Dickie L. Harraway-Smith, M.D., [name]

## 2016-04-02 ENCOUNTER — Encounter: Payer: Self-pay | Admitting: Obstetrics & Gynecology

## 2016-04-08 ENCOUNTER — Ambulatory Visit: Payer: Medicaid Other | Admitting: Family Medicine

## 2016-06-02 ENCOUNTER — Encounter: Payer: Self-pay | Admitting: *Deleted

## 2016-06-16 ENCOUNTER — Encounter: Payer: Self-pay | Admitting: Internal Medicine

## 2016-06-23 ENCOUNTER — Ambulatory Visit (HOSPITAL_COMMUNITY)
Admission: EM | Admit: 2016-06-23 | Discharge: 2016-06-23 | Disposition: A | Discharge status: Home / Self Care | Payer: Medicaid Other | Attending: Internal Medicine | Admitting: Internal Medicine

## 2016-06-23 ENCOUNTER — Encounter: Payer: Self-pay | Admitting: Internal Medicine

## 2016-06-23 ENCOUNTER — Encounter (HOSPITAL_COMMUNITY): Payer: Self-pay | Admitting: *Deleted

## 2016-06-23 DIAGNOSIS — K219 Gastro-esophageal reflux disease without esophagitis: Secondary | ICD-10-CM

## 2016-06-23 DIAGNOSIS — R0789 Other chest pain: Secondary | ICD-10-CM | POA: Diagnosis not present

## 2016-06-23 MED ORDER — CYCLOBENZAPRINE HCL 10 MG PO TABS: 10.0000 mg | ORAL_TABLET | Freq: Two times a day (BID) | ORAL | 0 refills | Status: DC | PRN

## 2016-06-23 MED ORDER — DICLOFENAC SODIUM 75 MG PO TBEC: 75.0000 mg | DELAYED_RELEASE_TABLET | Freq: Two times a day (BID) | ORAL | 0 refills | Status: DC

## 2016-06-23 NOTE — ED Provider Notes (Signed)
CSN: [id number]     Arrival date & time 06/23/16  [id number] History   First MD Initiated Contact with Patient 06/23/16 1008     Chief Complaint  Patient presents with  . Pain    under left breast   (Consider location/radiation/quality/duration/timing/severity/associated sxs/prior Treatment) 56 year old female reports 1-2 day history of left sided chest pain, left axillary area and back, states pain is worse with movement and deep inspiration and expiration and when laying on her left side, denies history of cough or history of trauma. Denies radiation, denies chest palpitations, denies SOB, weakness, dizziness, denies swelling in hands, feet, or ankles, denies worsening with exertion, pain is not relieved by rest. Has HX of HTN, denies hyperlipidemia or Dm, first degree relative with prior history of CVA. Has sensation of bloating and gas, relieved by OTC ranitidine.   The history is provided by the patient.    Past Medical History:  Diagnosis Date  . Anemia   . Anxiety   . Dysrhythmia    palpitations  . GERD (gastroesophageal reflux disease)   . Headache(784.0)   . Hypertension    Past Surgical History:  Procedure Laterality Date  . ABDOMINAL HYSTERECTOMY N/A 03/23/2013   Procedure: HYSTERECTOMY ABDOMINAL;  Surgeon: Lavonia Drafts, MD;  Location: [facility];  Service: Gynecology;  Laterality: N/A;  . BILATERAL SALPINGECTOMY Bilateral 03/23/2013   Procedure: BILATERAL SALPINGECTOMY;  Surgeon: Lavonia Drafts, MD;  Location: [facility];  Service: Gynecology;  Laterality: Bilateral;  . COLPOSCOPY    . FOOT SURGERY     right 5th toe-bone removed n1997   Family History  Problem Relation Age of Onset  . Arthritis Mother   . Stroke Mother   . Hypertension Mother   . Arthritis Father   . Cancer Father     prostate  . Stroke Father   . Alcohol abuse Brother   . Stroke Brother   . Cancer Paternal Aunt     breast and uterine  . Colon cancer Neg Hx    Social History   Substance Use Topics  . Smoking status: Never Smoker  . Smokeless tobacco: Never Used  . Alcohol use Yes     Comment: occ   OB History    Gravida Para Term Preterm AB Living   4 3 2 1 1 2    SAB TAB Ectopic Multiple Live Births   0 1 0 0       Review of Systems  Reason unable to perform ROS: as covered in HPI.  All other systems reviewed and are negative.   Allergies  Patient has no known allergies.  Home Medications   Prior to Admission medications   Medication Sig Start Date End Date Taking? Authorizing Provider  aspirin EC 81 MG tablet Take 81 mg by mouth daily.   Yes Historical Provider, MD  lisinopril-hydrochlorothiazide (PRINZIDE,ZESTORETIC) 10-12.5 MG per tablet Take 1 tablet by mouth daily. 06/23/14  Yes Lorayne Marek, MD  Multiple Vitamin (MULTIVITAMIN WITH MINERALS) TABS tablet Take 1 tablet by mouth daily.   Yes Historical Provider, MD  ranitidine (ZANTAC) 150 MG capsule Take 1 capsule (150 mg total) by mouth daily. 02/17/13  Yes Pamella Pert, MD  acetaminophen (TYLENOL) 500 MG tablet Take 1,000 mg by mouth every 6 (six) hours as needed (for headache).    Historical Provider, MD  cyclobenzaprine (FLEXERIL) 10 MG tablet Take 1 tablet (10 mg total) by mouth 2 (two) times daily as needed for muscle spasms. 06/23/16   Barnet Glasgow,  NP  diclofenac (VOLTAREN) 75 MG EC tablet Take 1 tablet (75 mg total) by mouth 2 (two) times daily. 06/23/16   Barnet Glasgow, NP  estradiol (ESTRACE) 1 MG tablet Take 1 tablet (1 mg total) by mouth daily. Patient not taking: Reported on 04/01/2016 07/19/15   Lavonia Drafts, MD  gabapentin (NEURONTIN) 300 MG capsule Take 300 mg by mouth at bedtime as needed. Reported on 10/05/2015 12/15/13   Lorayne Marek, MD  hydrocortisone 2.5 % cream Apply topically as needed (FOR ECZEMA). Reported on 10/05/2015 09/14/13   Lorayne Marek, MD  ibuprofen (ADVIL,MOTRIN) 600 MG tablet Take 1 tablet (600 mg total) by mouth every 8 (eight) hours as  needed. Patient not taking: Reported on 04/01/2016 01/11/14   Lorayne Marek, MD  LORazepam (ATIVAN) 0.5 MG tablet Take 1 tablet (0.5 mg total) by mouth as needed for anxiety or sleep. Patient not taking: Reported on 04/01/2016 03/07/14   Lorayne Marek, MD  sertraline (ZOLOFT) 25 MG tablet Take 1 tablet (25 mg total) by mouth daily. Patient not taking: Reported on 04/01/2016 11/18/13   Lorayne Marek, MD   Meds Ordered and Administered this Visit  Medications - No data to display  BP 132/84 (BP Location: Right Arm)   Pulse 81   Temp 98.9 F (37.2 C) (Oral)   Resp 17   LMP 10/03/2012   SpO2 100%  No data found.   Physical Exam  Constitutional: She is oriented to person, place, and time. She appears well-developed and well-nourished. No distress.  HENT:  Head: Normocephalic and atraumatic.  Right Ear: Tympanic membrane and external ear normal.  Left Ear: Tympanic membrane and external ear normal.  Nose: Nose normal.  Mouth/Throat: Uvula is midline, oropharynx is clear and moist and mucous membranes are normal.  Eyes: Pupils are equal, round, and reactive to light.  Neck: Normal range of motion. Neck supple. No JVD present.  Cardiovascular: Normal rate, regular rhythm, S1 normal, S2 normal, normal heart sounds and intact distal pulses.   Pulmonary/Chest: Effort normal and breath sounds normal.  Abdominal: Soft. Bowel sounds are normal.  Lymphadenopathy:    She has no cervical adenopathy.  Neurological: She is alert and oriented to person, place, and time.  Skin: Skin is warm and dry. Capillary refill takes less than 2 seconds. She is not diaphoretic.  Psychiatric: Her behavior is normal.  Nursing note and vitals reviewed.   Urgent Care Course     Procedures (including critical care time)  Labs Review Labs Reviewed - No data to display  Imaging Review No results found.   Visual Acuity Review  Right Eye Distance:   Left Eye Distance:   Bilateral Distance:    Right Eye  Near:   Left Eye Near:    Bilateral Near:         MDM   1. Chest wall pain   2. Gastroesophageal reflux disease without esophagitis   Based on your symptoms and physical exam, you most likely have a muscle strain. I advise rest, ice, elevate the affected extremity, wrap with ace bandages or appropriately sized split for support and compression. For ice, apply 15 minutes at a time up to 4 times daily, you may also alternate with heat if you wish. For medication, I have prescribed an antiinflammatory called Diclofenac, take 1 tablet twice daily.  I have also prescribed Flexeril, take 1 tablet twice daily. This medicine can cause drowsiness so do not drink or drive till you know how this medicine affects you.  For your acid reflux, continue with your OTC ranitidine, you may increase to twice a day, you may also use OTC omeprazole and for gas symptoms take simethicone.  Should your symptoms persist, I recommend you follow up with your primary care provider, an orthopedic doctor, or return to clinic as needed.       Barnet Glasgow, NP 06/23/16 1108

## 2016-06-23 NOTE — Discharge Instructions (Signed)
Based on your symptoms and physical exam, you most likely have a muscle strain. I advise rest, ice, elevate the affected extremity, wrap with ace bandages or appropriately sized split for support and compression. For ice, apply 15 minutes at a time up to 4 times daily, you may also alternate with heat if you wish. For medication, I have prescribed an antiinflammatory called Diclofenac, take 1 tablet twice daily.  I have also prescribed Flexeril, take 1 tablet twice daily. This medicine can cause drowsiness so do not drink or drive till you know how this medicine affects you.   For your acid reflux, continue with your OTC ranitidine, you may increase to twice a day, you may also use OTC omeprazole and for gas symptoms take simethicone.  Should your symptoms persist, I recommend you follow up with your primary care provider, an orthopedic doctor, or return to clinic as needed.

## 2016-06-23 NOTE — ED Triage Notes (Addendum)
Patient reports sudden onset of pain radiating from left upper back around left breast, increases with movement. Denies sob or specific chest pain. Denies injury. Establishing with new PCP next Tuesday.

## 2016-07-01 ENCOUNTER — Encounter: Payer: Self-pay | Admitting: Family Medicine

## 2016-07-01 ENCOUNTER — Ambulatory Visit: Payer: Medicaid Other | Attending: Family Medicine | Admitting: Family Medicine

## 2016-07-01 VITALS — BP 123/78 | HR 96 | Temp 98.8°F | Resp 18 | Ht 62.0 in | Wt 221.6 lb

## 2016-07-01 DIAGNOSIS — G44209 Tension-type headache, unspecified, not intractable: Secondary | ICD-10-CM | POA: Insufficient documentation

## 2016-07-01 DIAGNOSIS — K029 Dental caries, unspecified: Secondary | ICD-10-CM | POA: Insufficient documentation

## 2016-07-01 DIAGNOSIS — H538 Other visual disturbances: Secondary | ICD-10-CM | POA: Diagnosis not present

## 2016-07-01 DIAGNOSIS — Z87898 Personal history of other specified conditions: Secondary | ICD-10-CM | POA: Diagnosis not present

## 2016-07-01 DIAGNOSIS — Z79899 Other long term (current) drug therapy: Secondary | ICD-10-CM | POA: Diagnosis not present

## 2016-07-01 DIAGNOSIS — Z7982 Long term (current) use of aspirin: Secondary | ICD-10-CM | POA: Insufficient documentation

## 2016-07-01 DIAGNOSIS — Z8669 Personal history of other diseases of the nervous system and sense organs: Secondary | ICD-10-CM | POA: Diagnosis not present

## 2016-07-01 DIAGNOSIS — R1030 Lower abdominal pain, unspecified: Secondary | ICD-10-CM | POA: Insufficient documentation

## 2016-07-01 DIAGNOSIS — Z973 Presence of spectacles and contact lenses: Secondary | ICD-10-CM

## 2016-07-01 DIAGNOSIS — R143 Flatulence: Secondary | ICD-10-CM

## 2016-07-01 MED ORDER — IBUPROFEN 800 MG PO TABS: 800.0000 mg | ORAL_TABLET | Freq: Three times a day (TID) | ORAL | 0 refills | Status: DC | PRN

## 2016-07-01 MED ORDER — SIMETHICONE 125 MG PO CHEW: 125.0000 mg | CHEWABLE_TABLET | Freq: Four times a day (QID) | ORAL | 0 refills | Status: DC | PRN

## 2016-07-01 NOTE — Progress Notes (Signed)
Patient complains pain on her left side under the rib cage and it goes around her back side  Patient also complains about pressure headaches that comes and goes   Patient has not taking any meds today  Patient has not eatentoday

## 2016-07-01 NOTE — Patient Instructions (Signed)
Abdominal Pain, Adult Abdominal pain can be caused by many things. Often, abdominal pain is not serious and it gets better with no treatment or by being treated at home. However, sometimes abdominal pain is serious. Your health care provider will do a medical history and a physical exam to try to determine the cause of your abdominal pain. Follow these instructions at home:  Take over-the-counter and prescription medicines only as told by your health care provider. Do not take a laxative unless told by your health care provider.  Drink enough fluid to keep your urine clear or pale yellow.  Watch your condition for any changes.  Keep all follow-up visits as told by your health care provider. This is important. Contact a health care provider if:  Your abdominal pain changes or gets worse.  You are not hungry or you lose weight without trying.  You are constipated or have diarrhea for more than 2-3 days.  You have pain when you urinate or have a bowel movement.  Your abdominal pain wakes you up at night.  Your pain gets worse with meals, after eating, or with certain foods.  You are throwing up and cannot keep anything down.  You have a fever. Get help right away if:  Your pain does not go away as soon as your health care provider told you to expect.  You cannot stop throwing up.  Your pain is only in areas of the abdomen, such as the right side or the left lower portion of the abdomen.  You have bloody or black stools, or stools that look like tar.  You have severe pain, cramping, or bloating in your abdomen.  You have signs of dehydration, such as:  Dark urine, very little urine, or no urine.  Cracked lips.  Dry mouth.  Sunken eyes.  Sleepiness.  Weakness. This information is not intended to replace advice given to you by your health care provider. Make sure you discuss any questions you have with your health care provider. Document Released: 02/05/2005 Document  Revised: 11/16/2015 Document Reviewed: 10/10/2015 Elsevier Interactive Patient Education  2017 [place] are tests that create pictures of the inside of your body using radiation. Different body parts absorb different amounts of radiation, which show up on the X-ray pictures in shades of black, gray, and white. X-rays are used to look for many health conditions, including broken bones, lung problems, and some types of cancer. Tell a health care provider about:  Any allergies you have.  All medicines you are taking, including vitamins, herbs, eye drops, creams, and over-the-counter medicines.  Previous surgeries you have had.  Medical conditions you have. What are the risks? Getting an X-ray is a safe procedure. What happens before the procedure?  Tell the X-ray technician if you are pregnant or might be pregnant.  You may be asked to wear a protective lead apron to hide parts of your body from the X-ray.  You usually will need to undress whatever part of your body needs the X-ray. You will be given a hospital gown to wear, if needed.  You may need to remove your glasses, jewelry, and other metal objects. What happens during the procedure?  The X-ray machine creates a picture by using a tiny burst of radiation. It is painless.  You may need to have several pictures taken at different angles.  You will need to try to be as still as you can during the examination to get the best possible images.  What happens after the procedure?  You will be able to resume your normal activities.  The X-ray will be examined by your health care provider or a radiology specialist.  It is your responsibility to get your test results. Ask your health care provider when to expect your results and how to get them. This information is not intended to replace advice given to you by your health care provider. Make sure you discuss any questions you have with your health care  provider. Document Released: 04/28/2005 Document Revised: 12/27/2015 Document Reviewed: 06/22/2013 Elsevier Interactive Patient Education  2017 [employer].

## 2016-07-01 NOTE — Progress Notes (Signed)
Subjective:  Patient ID: Ann [name], female    DOB: [birth year]  Age: 55 y.o. MRN: [id number]  CC: Establish Care   HPI Ann [name] presents for    1. Left-sided abdominal complaint: Recent history of ED visit. Denies taking medications prescribed for symptoms. She reports taking simethicone which relieved her symptoms.Reports sensation of something "shifting"in her abdomen when lying down on her left side for one year. She denies any constitutional symptoms. She denies any nausea, vomiting, constipation, or blood in stool.   2. Headaches: Reports history of intermittent headaches. Denies any nausea, vomiting, or photophobia.  3 Corrective lens use: She reports blurred vision or couple months. History of corrective lens use glasses and contacts. Reports ophthalmology exam has been more than one year ago.   4. Dental problem: Dental pain left lower molar. Denies any facial swelling, fevers, drainage from the gingiva, or shortness of breath.   Outpatient Medications Prior to Visit  Medication Sig Dispense Refill  . aspirin EC 81 MG tablet Take 81 mg by mouth daily.    Marland Kitchen lisinopril-hydrochlorothiazide (PRINZIDE,ZESTORETIC) 10-12.5 MG per tablet Take 1 tablet by mouth daily. 90 tablet 2  . Multiple Vitamin (MULTIVITAMIN WITH MINERALS) TABS tablet Take 1 tablet by mouth daily.    Marland Kitchen acetaminophen (TYLENOL) 500 MG tablet Take 1,000 mg by mouth every 6 (six) hours as needed (for headache).    . cyclobenzaprine (FLEXERIL) 10 MG tablet Take 1 tablet (10 mg total) by mouth 2 (two) times daily as needed for muscle spasms. (Patient not taking: Reported on 07/01/2016) 20 tablet 0  . diclofenac (VOLTAREN) 75 MG EC tablet Take 1 tablet (75 mg total) by mouth 2 (two) times daily. (Patient not taking: Reported on 07/01/2016) 20 tablet 0  . estradiol (ESTRACE) 1 MG tablet Take 1 tablet (1 mg total) by mouth daily. (Patient not taking: Reported on 04/01/2016) 30 tablet 1  . gabapentin (NEURONTIN) 300  MG capsule Take 300 mg by mouth at bedtime as needed. Reported on 10/05/2015    . hydrocortisone 2.5 % cream Apply topically as needed (FOR ECZEMA). Reported on 10/05/2015    . LORazepam (ATIVAN) 0.5 MG tablet Take 1 tablet (0.5 mg total) by mouth as needed for anxiety or sleep. (Patient not taking: Reported on 04/01/2016) 30 tablet 0  . ranitidine (ZANTAC) 150 MG capsule Take 1 capsule (150 mg total) by mouth daily. (Patient not taking: Reported on 07/01/2016) 30 capsule 1  . sertraline (ZOLOFT) 25 MG tablet Take 1 tablet (25 mg total) by mouth daily. (Patient not taking: Reported on 04/01/2016) 30 tablet 3  . ibuprofen (ADVIL,MOTRIN) 600 MG tablet Take 1 tablet (600 mg total) by mouth every 8 (eight) hours as needed. (Patient not taking: Reported on 04/01/2016) 30 tablet 1   No facility-administered medications prior to visit.     ROS Review of Systems  Constitutional: Negative.   HENT: Positive for dental problem.   Eyes: Positive for visual disturbance.  Respiratory: Negative.   Cardiovascular: Negative.   Gastrointestinal: Positive for nausea.       Gas pain   Neurological: Positive for headaches.     Objective:  BP 123/78 (BP Location: Left Arm, Patient Position: Sitting, Cuff Size: Normal)   Pulse 96   Temp 98.8 F (37.1 C) (Oral)   Resp 18   Ht 5\' 2"  (1.575 m)   Wt 221 lb 9.6 oz (100.5 kg)   LMP 10/03/2012   SpO2 99%   BMI 40.53 kg/m  BP/Weight 07/01/2016 06/23/2016 [id number]  Systolic BP [id number]  Diastolic BP 78 84 98  Wt. (Lbs) 221.6 - 227.3  BMI 40.53 - 41.57     Physical Exam  Constitutional: She is oriented to person, place, and time.  HENT:  Head: Normocephalic and atraumatic.  Right Ear: External ear normal.  Left Ear: External ear normal.  Nose: Nose normal.  Mouth/Throat: Oropharynx is clear and moist. Dental caries (left lower molar) present. No dental abscesses.  Eyes: Conjunctivae are normal. Pupils are equal, round, and reactive to light.    Cardiovascular: Normal rate, regular rhythm, normal heart sounds and intact distal pulses.   Pulmonary/Chest: Effort normal and breath sounds normal.  Abdominal: Soft. Bowel sounds are normal.  Neurological: She is alert and oriented to person, place, and time.  Skin: Skin is warm and dry.  Nursing note and vitals reviewed.    Assessment & Plan:   Problem List Items Addressed This Visit    None    Visit Diagnoses    History of abdominal pain    -  Primary   Relevant Orders   BASIC METABOLIC PANEL WITH GFR   CBC with Differential   Hepatic Function Panel   Lipase   Amylase   DG Abd 2 Views   Tension headache       Relevant Medications   ibuprofen (ADVIL,MOTRIN) 800 MG tablet   Dental caries       Relevant Orders   Ambulatory referral to Dentistry   History of blurred vision       Relevant Orders   Ambulatory referral to Ophthalmology   Wears contact lenses       Relevant Orders   Ambulatory referral to Ophthalmology   Flatulence       Relevant Medications   simethicone (MYLICON) 0000000 MG chewable tablet      Meds ordered this encounter  Medications  . simethicone (MYLICON) 0000000 MG chewable tablet    Sig: Chew 1 tablet (125 mg total) by mouth every 6 (six) hours as needed for flatulence.    Refill:  0    Order Specific Question:   Supervising Provider    Answer:   Tresa Garter [id number]  . ibuprofen (ADVIL,MOTRIN) 800 MG tablet    Sig: Take 1 tablet (800 mg total) by mouth every 8 (eight) hours as needed for headache (Take with food.).    Dispense:  30 tablet    Refill:  0    Order Specific Question:   Supervising Provider    Answer:   Tresa Garter [id number]    Follow-up: Return if symptoms worsen or fail to improve.   Alfonse Spruce FNP

## 2016-07-25 ENCOUNTER — Ambulatory Visit: Payer: Medicaid Other | Admitting: Family Medicine

## 2016-07-30 ENCOUNTER — Encounter: Payer: Self-pay | Admitting: Family Medicine

## 2016-07-30 ENCOUNTER — Ambulatory Visit: Payer: Medicaid Other | Admitting: Family Medicine

## 2016-07-30 NOTE — Progress Notes (Signed)
Patient is here for Hip- knee pain that comes and goes  Patient stated that its difficult to climb stairs  Patient stated that her lump in her throat went away  Patient has taking her current meds for today  Patient has eaten today

## 2016-07-30 NOTE — Progress Notes (Deleted)
   Subjective:  Patient ID: Ann [name], female    DOB: 04-Feb-1961  Age: 56 y.o. MRN: [id number]  CC: No chief complaint on file.   HPI Ann [name] presents for      Outpatient Medications Prior to Visit  Medication Sig Dispense Refill  . acetaminophen (TYLENOL) 500 MG tablet Take 1,000 mg by mouth every 6 (six) hours as needed (for headache).    Marland Kitchen aspirin EC 81 MG tablet Take 81 mg by mouth daily.    . cyclobenzaprine (FLEXERIL) 10 MG tablet Take 1 tablet (10 mg total) by mouth 2 (two) times daily as needed for muscle spasms. (Patient not taking: Reported on 07/01/2016) 20 tablet 0  . diclofenac (VOLTAREN) 75 MG EC tablet Take 1 tablet (75 mg total) by mouth 2 (two) times daily. (Patient not taking: Reported on 07/01/2016) 20 tablet 0  . estradiol (ESTRACE) 1 MG tablet Take 1 tablet (1 mg total) by mouth daily. (Patient not taking: Reported on 04/01/2016) 30 tablet 1  . gabapentin (NEURONTIN) 300 MG capsule Take 300 mg by mouth at bedtime as needed. Reported on 10/05/2015    . hydrocortisone 2.5 % cream Apply topically as needed (FOR ECZEMA). Reported on 10/05/2015    . ibuprofen (ADVIL,MOTRIN) 800 MG tablet Take 1 tablet (800 mg total) by mouth every 8 (eight) hours as needed for headache (Take with food.). 30 tablet 0  . lisinopril-hydrochlorothiazide (PRINZIDE,ZESTORETIC) 10-12.5 MG per tablet Take 1 tablet by mouth daily. 90 tablet 2  . LORazepam (ATIVAN) 0.5 MG tablet Take 1 tablet (0.5 mg total) by mouth as needed for anxiety or sleep. (Patient not taking: Reported on 04/01/2016) 30 tablet 0  . Multiple Vitamin (MULTIVITAMIN WITH MINERALS) TABS tablet Take 1 tablet by mouth daily.    . ranitidine (ZANTAC) 150 MG capsule Take 1 capsule (150 mg total) by mouth daily. (Patient not taking: Reported on 07/01/2016) 30 capsule 1  . sertraline (ZOLOFT) 25 MG tablet Take 1 tablet (25 mg total) by mouth daily. (Patient not taking: Reported on 04/01/2016) 30 tablet 3  . simethicone (MYLICON)  300 MG chewable tablet Chew 1 tablet (125 mg total) by mouth every 6 (six) hours as needed for flatulence.  0   No facility-administered medications prior to visit.     ROS Review of Systems      Objective:  LMP 10/03/2012   BP/Weight 07/01/2016 06/23/2016 76/22/6333  Systolic BP 545 625 638  Diastolic BP 78 84 98  Wt. (Lbs) 221.6 - 227.3  BMI 40.53 - 41.57     Physical Exam   Assessment & Plan:   Problem List Items Addressed This Visit    None      No orders of the defined types were placed in this encounter.   Follow-up: No Follow-up on file.   Alfonse Spruce FNP     This encounter was created in error - please disregard.

## 2016-07-31 ENCOUNTER — Ambulatory Visit: Payer: Medicaid Other | Admitting: Family Medicine

## 2016-08-05 ENCOUNTER — Encounter: Payer: Self-pay | Admitting: Family Medicine

## 2016-08-05 NOTE — Telephone Encounter (Signed)
This encounter was created in error - please disregard.

## 2016-08-13 ENCOUNTER — Ambulatory Visit: Payer: Medicaid Other | Attending: Family Medicine | Admitting: Family Medicine

## 2016-08-13 ENCOUNTER — Encounter: Payer: Self-pay | Admitting: Family Medicine

## 2016-08-13 VITALS — BP 118/83 | HR 76 | Temp 98.2°F | Resp 18 | Ht 64.0 in | Wt 222.2 lb

## 2016-08-13 DIAGNOSIS — Z8249 Family history of ischemic heart disease and other diseases of the circulatory system: Secondary | ICD-10-CM | POA: Insufficient documentation

## 2016-08-13 DIAGNOSIS — Z7982 Long term (current) use of aspirin: Secondary | ICD-10-CM | POA: Insufficient documentation

## 2016-08-13 DIAGNOSIS — B354 Tinea corporis: Secondary | ICD-10-CM | POA: Diagnosis not present

## 2016-08-13 DIAGNOSIS — Z8042 Family history of malignant neoplasm of prostate: Secondary | ICD-10-CM | POA: Insufficient documentation

## 2016-08-13 DIAGNOSIS — Z9079 Acquired absence of other genital organ(s): Secondary | ICD-10-CM | POA: Diagnosis not present

## 2016-08-13 DIAGNOSIS — Z0000 Encounter for general adult medical examination without abnormal findings: Secondary | ICD-10-CM | POA: Diagnosis not present

## 2016-08-13 DIAGNOSIS — I10 Essential (primary) hypertension: Secondary | ICD-10-CM | POA: Insufficient documentation

## 2016-08-13 DIAGNOSIS — R4589 Other symptoms and signs involving emotional state: Secondary | ICD-10-CM

## 2016-08-13 DIAGNOSIS — Z8261 Family history of arthritis: Secondary | ICD-10-CM | POA: Diagnosis not present

## 2016-08-13 DIAGNOSIS — Z823 Family history of stroke: Secondary | ICD-10-CM | POA: Diagnosis not present

## 2016-08-13 DIAGNOSIS — F419 Anxiety disorder, unspecified: Secondary | ICD-10-CM | POA: Insufficient documentation

## 2016-08-13 DIAGNOSIS — F5101 Primary insomnia: Secondary | ICD-10-CM | POA: Diagnosis not present

## 2016-08-13 DIAGNOSIS — M25552 Pain in left hip: Secondary | ICD-10-CM | POA: Diagnosis not present

## 2016-08-13 DIAGNOSIS — Z79899 Other long term (current) drug therapy: Secondary | ICD-10-CM | POA: Insufficient documentation

## 2016-08-13 DIAGNOSIS — F329 Major depressive disorder, single episode, unspecified: Secondary | ICD-10-CM

## 2016-08-13 DIAGNOSIS — K219 Gastro-esophageal reflux disease without esophagitis: Secondary | ICD-10-CM | POA: Insufficient documentation

## 2016-08-13 MED ORDER — CAPSAICIN 0.025 % EX CREA: TOPICAL_CREAM | Freq: Two times a day (BID) | CUTANEOUS | 0 refills | Status: DC | PRN

## 2016-08-13 MED ORDER — IBUPROFEN 800 MG PO TABS: 800.0000 mg | ORAL_TABLET | Freq: Three times a day (TID) | ORAL | 0 refills | Status: DC | PRN

## 2016-08-13 MED ORDER — TERBINAFINE HCL 1 % EX CREA: TOPICAL_CREAM | Freq: Two times a day (BID) | CUTANEOUS | 0 refills | Status: DC

## 2016-08-13 MED ORDER — MELATONIN 5 MG PO CAPS: 5.0000 mg | ORAL_CAPSULE | Freq: Every day | ORAL | 0 refills | Status: DC

## 2016-08-13 NOTE — Patient Instructions (Addendum)
Schedule appointment for routine pap smear.   Musculoskeletal Pain Musculoskeletal pain is muscle and bone aches and pains. This pain can occur in any part of the body. Follow these instructions at home:  Only take medicines for pain, discomfort, or fever as told by your health care provider.  You may continue all activities unless the activities cause more pain. When the pain lessens, slowly resume normal activities. Gradually increase the intensity and duration of the activities or exercise.  During periods of severe pain, bed rest may be helpful. Lie or sit in any position that is comfortable, but get out of bed and walk around at least every several hours.  If directed, put ice on the injured area.  Put ice in a plastic bag.  Place a towel between your skin and the bag.  Leave the ice on for 20 minutes, 2-3 times a day. Contact a health care provider if:  Your pain is getting worse.  Your pain is not relieved with medicines.  You lose function in the area of the pain if the pain is in your arms, legs, or neck. This information is not intended to replace advice given to you by your health care provider. Make sure you discuss any questions you have with your health care provider. Document Released: 04/28/2005 Document Revised: 10/09/2015 Document Reviewed: 12/31/2012 Elsevier Interactive Patient Education  2017 [place] With Depression Everyone experiences occasional disappointment, sadness, and loss in their lives. When you are feeling down, blue, or sad for at least 2 weeks in a row, it may mean that you have depression. Depression can affect your thoughts and feelings, relationships, daily activities, and physical health. It is caused by changes in the way your brain functions. If you receive a diagnosis of depression, your health care provider will tell you which type of depression you have and what treatment options are available to you. If you are living with  depression, there are ways to help you recover from it and also ways to prevent it from coming back. How to cope with lifestyle changes Coping with stress  Stress is your body's reaction to life changes and events, both good and bad. Stressful situations may include:  Getting married.  The death of a spouse.  Losing a job.  Retiring.  Having a baby. Stress can last just a few hours or it can be ongoing. Stress can play a major role in depression, so it is important to learn both how to cope with stress and how to think about it differently. Talk with your health care provider or a counselor if you would like to learn more about stress reduction. He or she may suggest some stress reduction techniques, such as:  Music therapy. This can include creating music or listening to music. Choose music that you enjoy and that inspires you.  Mindfulness-based meditation. This kind of meditation can be done while sitting or walking. It involves being aware of your normal breaths, rather than trying to control your breathing.  Centering prayer. This is a kind of meditation that involves focusing on a spiritual word or phrase. Choose a word, phrase, or sacred image that is meaningful to you and that brings you peace.  Deep breathing. To do this, expand your stomach and inhale slowly through your nose. Hold your breath for 3-5 seconds, then exhale slowly, allowing your stomach muscles to relax.  Muscle relaxation. This involves intentionally tensing muscles then relaxing them. Choose a stress reduction technique  that fits your lifestyle and personality. Stress reduction techniques take time and practice to develop. Set aside 5-15 minutes a day to do them. Therapists can offer training in these techniques. The training may be covered by some insurance plans. Other things you can do to manage stress include:  Keeping a stress diary. This can help you learn what triggers your stress and ways to control your  response.  Understanding what your limits are and saying no to requests or events that lead to a schedule that is too full.  Thinking about how you respond to certain situations. You may not be able to control everything, but you can control how you react.  Adding humor to your life by watching funny films or TV shows.  Making time for activities that help you relax and not feeling guilty about spending your time this way. Medicines  Your health care provider may suggest certain medicines if he or she feels that they will help improve your condition. Avoid using alcohol and other substances that may prevent your medicines from working properly (may interact). It is also important to:  Talk with your pharmacist or health care provider about all the medicines that you take, their possible side effects, and what medicines are safe to take together.  Make it your goal to take part in all treatment decisions (shared decision-making). This includes giving input on the side effects of medicines. It is best if shared decision-making with your health care provider is part of your total treatment plan. If your health care provider prescribes a medicine, you may not notice the full benefits of it for 4-8 weeks. Most people who are treated for depression need to be on medicine for at least 6-12 months after they feel better. If you are taking medicines as part of your treatment, do not stop taking medicines without first talking to your health care provider. You may need to have the medicine slowly decreased (tapered) over time to decrease the risk of harmful side effects. Relationships  Your health care provider may suggest family therapy along with individual therapy and drug therapy. While there may not be family problems that are causing you to feel depressed, it is still important to make sure your family learns as much as they can about your mental health. Having your family's support can help make your  treatment successful. How to recognize changes in your condition Everyone has a different response to treatment for depression. Recovery from major depression happens when you have not had signs of major depression for two months. This may mean that you will start to:  Have more interest in doing activities.  Feel less hopeless than you did 2 months ago.  Have more energy.  Overeat less often, or have better or improving appetite.  Have better concentration. Your health care provider will work with you to decide the next steps in your recovery. It is also important to recognize when your condition is getting worse. Watch for these signs:  Having fatigue or low energy.  Eating too much or too little.  Sleeping too much or too little.  Feeling restless, agitated, or hopeless.  Having trouble concentrating or making decisions.  Having unexplained physical complaints.  Feeling irritable, angry, or aggressive. Get help as soon as you or your family members notice these symptoms coming back. How to get support and help from others How to talk with friends and family members about your condition  Talking to friends and family members about  your condition can provide you with one way to get support and guidance. Reach out to trusted friends or family members, explain your symptoms to them, and let them know that you are working with a health care provider to treat your depression. Financial resources  Not all insurance plans cover mental health care, so it is important to check with your insurance carrier. If paying for co-pays or counseling services is a problem, search for a local or county mental health care center. They may be able to offer public mental health care services at low or no cost when you are not able to see a private health care provider. If you are taking medicine for depression, you may be able to get the generic form, which may be less expensive. Some makers of  prescription medicines also offer help to patients who cannot afford the medicines they need. Follow these instructions at home:  Get the right amount and quality of sleep.  Cut down on using caffeine, tobacco, alcohol, and other potentially harmful substances.  Try to exercise, such as walking or lifting small weights.  Take over-the-counter and prescription medicines only as told by your health care provider.  Eat a healthy diet that includes plenty of vegetables, fruits, whole grains, low-fat dairy products, and lean protein. Do not eat a lot of foods that are high in solid fats, added sugars, or salt.  Keep all follow-up visits as told by your health care provider. This is important. Contact a health care provider if:  You stop taking your antidepressant medicines, and you have any of these symptoms:  Nausea.  Headache.  Feeling lightheaded.  Chills and body aches.  Not being able to sleep (insomnia).  You or your friends and family think your depression is getting worse. Get help right away if:  You have thoughts of hurting yourself or others. If you ever feel like you may hurt yourself or others, or have thoughts about taking your own life, get help right away. You can go to your nearest emergency department or call:  Your local emergency services (911 in the [place]  A suicide crisis helpline, such as the [facility] at 385-228-8761. This is open 24-hours a day. Summary  If you are living with depression, there are ways to help you recover from it and also ways to prevent it from coming back.  Work with your health care team to create a management plan that includes counseling, stress management techniques, and healthy lifestyle habits. This information is not intended to replace advice given to you by your health care provider. Make sure you discuss any questions you have with your health care provider. Document Released: 03/31/2016  Document Revised: 03/31/2016 Document Reviewed: 03/31/2016 Elsevier Interactive Patient Education  2017 [employer]  Body Ringworm Body ringworm is an infection of the skin that often causes a ring-shaped rash. Body ringworm can affect any part of your skin. It can spread easily to others. Body ringworm is also called tinea corporis. What are the causes? This condition is caused by funguses called dermatophytes. The condition develops when these funguses grow out of control on the skin. You can get this condition if you touch a person or animal that has it. You can also get it if you share clothing, bedding, towels, or any other object with an infected person or pet. What increases the risk? This condition is more likely to develop in:  Athletes who often make skin-to-skin contact with other athletes, such  as wrestlers.  People who share equipment and mats.  People with a weakened immune system. What are the signs or symptoms? Symptoms of this condition include:  Itchy, raised red spots and bumps.  Red scaly patches.  A ring-shaped rash. The rash may have:  A clear center.  Scales or red bumps at its center.  Redness near its borders.  Dry and scaly skin on or around it. How is this diagnosed? This condition can usually be diagnosed with a skin exam. A skin scraping may be taken from the affected area and examined under a microscope to see if the fungus is present. How is this treated? This condition may be treated with:  An antifungal cream or ointment.  An antifungal shampoo.  Antifungal medicines. These may be prescribed if your ringworm is severe, keeps coming back, or lasts a long time. Follow these instructions at home:  Take over-the-counter and prescription medicines only as told by your health care provider.  If you were given an antifungal cream or ointment:  Use it as told by your health care provider.  Wash the infected area and dry it completely  before applying the cream or ointment.  If you were given an antifungal shampoo:  Use it as told by your health care provider.  Leave the shampoo on your body for 3-5 minutes before rinsing.  While you have a rash:  Wear loose clothing to stop clothes from rubbing and irritating it.  Wash or change your bed sheets every night.  If your pet has the same infection, take your pet to see a Animal nutritionist. How is this prevented?  Practice good hygiene.  Wear sandals or shoes in public places and showers.  Do not share personal items with others.  Avoid touching red patches of skin on other people.  Avoid touching pets that have bald spots.  If you touch an animal that has a bald spot, wash your hands. Contact a health care provider if:  Your rash continues to spread after 7 days of treatment.  Your rash is not gone in 4 weeks.  The area around your rash gets red, warm, tender, and swollen. This information is not intended to replace advice given to you by your health care provider. Make sure you discuss any questions you have with your health care provider. Document Released: 04/25/2000 Document Revised: 10/04/2015 Document Reviewed: 02/22/2015 Elsevier Interactive Patient Education  2017 [employer].

## 2016-08-13 NOTE — Progress Notes (Signed)
Subjective:   Patient ID: Ann [name], female    DOB: 26-Aug-1960, 56 y.o.   MRN: [id number]  Chief Complaint  Patient presents with  . Annual Exam   HPI Ann [name] 56 y.o. female presents with   Annual physical examination: Denies any CP, SOB, or consitutional symptoms. History of skin rashes 1 month. Skin rashes to the left lower leg and posterior neck. Symptoms include itching and dry skin. Reports using lotion and oils to the area. Reports history of blurred vision and corrective lens use. Recent eye exam [facility]. She reports it  was recommended that she get a new corrective lens prescription. History of heartburn reports taking Zantac and simethicone OTC for relief. Complaints of left hip pain for 2 weeks. Denies any significant history of injury. Reports difficulty with walking up stairs.Reports depressed mood and difficulty sleeping. Denies any SI/HR. Reports 5 hours of sleep per night.   Past Medical History:  Diagnosis Date  . Anemia   . Anxiety   . Dysrhythmia    palpitations  . GERD (gastroesophageal reflux disease)   . Headache(784.0)   . Hypertension     Past Surgical History:  Procedure Laterality Date  . ABDOMINAL HYSTERECTOMY N/A 03/23/2013   Procedure: HYSTERECTOMY ABDOMINAL;  Surgeon: Lavonia Drafts, MD;  Location: [facility];  Service: Gynecology;  Laterality: N/A;  . BILATERAL SALPINGECTOMY Bilateral 03/23/2013   Procedure: BILATERAL SALPINGECTOMY;  Surgeon: Lavonia Drafts, MD;  Location: [facility];  Service: Gynecology;  Laterality: Bilateral;  . COLPOSCOPY    . FOOT SURGERY     right 5th toe-bone removed n1997    Family History  Problem Relation Age of Onset  . Arthritis Mother   . Stroke Mother   . Hypertension Mother   . Arthritis Father   . Cancer Father     prostate  . Stroke Father   . Alcohol abuse Brother   . Stroke Brother   . Cancer Paternal Aunt     breast and uterine  . Colon cancer Neg Hx     Social History    Social History  . Marital status: Single    Spouse name: N/A  . Number of children: N/A  . Years of education: N/A   Occupational History  . Not on file.   Social History Main Topics  . Smoking status: Never Smoker  . Smokeless tobacco: Never Used  . Alcohol use Yes     Comment: occ  . Drug use: No  . Sexual activity: Yes    Birth control/ protection: Surgical   Other Topics Concern  . Not on file   Social History Narrative  . No narrative on file    Outpatient Medications Prior to Visit  Medication Sig Dispense Refill  . aspirin EC 81 MG tablet Take 81 mg by mouth daily.    Marland Kitchen lisinopril-hydrochlorothiazide (PRINZIDE,ZESTORETIC) 10-12.5 MG per tablet Take 1 tablet by mouth daily. 90 tablet 2  . Multiple Vitamin (MULTIVITAMIN WITH MINERALS) TABS tablet Take 1 tablet by mouth daily.    Marland Kitchen acetaminophen (TYLENOL) 500 MG tablet Take 1,000 mg by mouth every 6 (six) hours as needed (for headache).    . cyclobenzaprine (FLEXERIL) 10 MG tablet Take 1 tablet (10 mg total) by mouth 2 (two) times daily as needed for muscle spasms. (Patient not taking: Reported on 07/01/2016) 20 tablet 0  . diclofenac (VOLTAREN) 75 MG EC tablet Take 1 tablet (75 mg total) by mouth 2 (two) times daily. (Patient not  taking: Reported on 07/01/2016) 20 tablet 0  . estradiol (ESTRACE) 1 MG tablet Take 1 tablet (1 mg total) by mouth daily. (Patient not taking: Reported on 04/01/2016) 30 tablet 1  . gabapentin (NEURONTIN) 300 MG capsule Take 300 mg by mouth at bedtime as needed. Reported on 10/05/2015    . hydrocortisone 2.5 % cream Apply topically as needed (FOR ECZEMA). Reported on 10/05/2015    . LORazepam (ATIVAN) 0.5 MG tablet Take 1 tablet (0.5 mg total) by mouth as needed for anxiety or sleep. (Patient not taking: Reported on 04/01/2016) 30 tablet 0  . ranitidine (ZANTAC) 150 MG capsule Take 1 capsule (150 mg total) by mouth daily. (Patient not taking: Reported on 07/01/2016) 30 capsule 1  . sertraline  (ZOLOFT) 25 MG tablet Take 1 tablet (25 mg total) by mouth daily. (Patient not taking: Reported on 04/01/2016) 30 tablet 3  . simethicone (MYLICON) 371 MG chewable tablet Chew 1 tablet (125 mg total) by mouth every 6 (six) hours as needed for flatulence.  0  . ibuprofen (ADVIL,MOTRIN) 800 MG tablet Take 1 tablet (800 mg total) by mouth every 8 (eight) hours as needed for headache (Take with food.). 30 tablet 0   No facility-administered medications prior to visit.     No Known Allergies  Review of Systems  Constitutional: Negative.   HENT: Negative.   Eyes: Positive for blurred vision.  Respiratory: Negative.   Cardiovascular: Negative.   Gastrointestinal: Positive for heartburn.  Musculoskeletal: Positive for myalgias and neck pain.  Skin: Positive for rash.  Neurological: Negative.   Endo/Heme/Allergies: Negative.   Psychiatric/Behavioral: Positive for depression.      Objective:    Physical Exam  Constitutional: She is oriented to person, place, and time. She appears well-developed and well-nourished.  HENT:  Head: Normocephalic and atraumatic.  Right Ear: External ear normal.  Left Ear: External ear normal.  Nose: Nose normal.  Mouth/Throat: Oropharynx is clear and moist.  Eyes: Conjunctivae and EOM are normal. Pupils are equal, round, and reactive to light.  Neck: Normal range of motion. Neck supple.  Cardiovascular: Normal rate, regular rhythm, normal heart sounds and intact distal pulses.   Pulmonary/Chest: Effort normal and breath sounds normal.  Abdominal: Soft. Bowel sounds are normal.  Musculoskeletal: Normal range of motion.  Left hip pain with adduction.   Neurological: She is alert and oriented to person, place, and time. She has normal reflexes.  Skin: Skin is warm and dry. Rash (circular  rash to right  lower leg and neck. ) noted.  Psychiatric: She has a normal mood and affect.  Nursing note and vitals reviewed.   BP 118/83 (BP Location: Left Arm,  Patient Position: Sitting, Cuff Size: Normal)   Pulse 76   Temp 98.2 F (36.8 C) (Oral)   Resp 18   Ht '5\' 4"'  (1.626 m)   Wt 222 lb 3.2 oz (100.8 kg)   LMP 10/03/2012   SpO2 98%   BMI 38.14 kg/m  Wt Readings from Last 3 Encounters:  08/13/16 222 lb 3.2 oz (100.8 kg)  07/30/16 224 lb 12.8 oz (102 kg)  07/01/16 221 lb 9.6 oz (100.5 kg)    Immunization History  Administered Date(s) Administered  . Influenza,inj,Quad PF,36+ Mos 06/23/2014  . PPD Test 11/24/2012  . Tdap 11/17/2012    Lab Results  Component Value Date   TSH 0.855 08/13/2016   Lab Results  Component Value Date   WBC 4.3 12/26/2013   HGB 13.8 12/26/2013   HCT  41.3 12/26/2013   MCV 88.8 12/26/2013   PLT 213 12/26/2013   Lab Results  Component Value Date   NA 139 08/13/2016   K 4.3 08/13/2016   CO2 24 08/13/2016   GLUCOSE 93 08/13/2016   BUN 18 08/13/2016   CREATININE 0.99 08/13/2016   BILITOT 0.6 08/13/2016   ALKPHOS 71 08/13/2016   AST 19 08/13/2016   ALT 16 08/13/2016   PROT 7.7 08/13/2016   ALBUMIN 4.3 08/13/2016   CALCIUM 9.9 08/13/2016   ANIONGAP 12 12/26/2013   Lab Results  Component Value Date   CHOL 197 08/13/2016   CHOL 167 11/17/2012   Lab Results  Component Value Date   HDL 78 08/13/2016   HDL 64 11/17/2012   Lab Results  Component Value Date   LDLCALC 100 (H) 08/13/2016   LDLCALC 87 11/17/2012   Lab Results  Component Value Date   TRIG 97 08/13/2016   TRIG 80 11/17/2012   Lab Results  Component Value Date   CHOLHDL 2.5 08/13/2016   CHOLHDL 2.6 11/17/2012   Lab Results  Component Value Date   HGBA1C 5.6 08/13/2016   HGBA1C 5.3 11/17/2012       Assessment & Plan:   Problem List Items Addressed This Visit    None    Visit Diagnoses    Tinea corporis    -  Primary   Relevant Medications   terbinafine (LAMISIL AT) 1 % cream   Annual physical exam       Relevant Orders   CMP14+EGFR (Completed)   Lipid panel (Completed)   TSH (Completed)   Hemoglobin A1c  (Completed)   VITAMIN D 25 Hydroxy (Vit-D Deficiency, Fractures) (Completed)   Healthcare maintenance       Relevant Orders   HIV antibody (with reflex) (Completed)   Hepatitis C Antibody (Completed)   Hip pain, acute, left       Relevant Medications   ibuprofen (ADVIL,MOTRIN) 800 MG tablet   capsaicin (ZOSTRIX) 0.025 % cream   Primary insomnia       Relevant Medications   Melatonin 5 MG CAPS   Depressed mood       -Provided with counseling resources.      Meds ordered this encounter  Medications  . ibuprofen (ADVIL,MOTRIN) 800 MG tablet    Sig: Take 1 tablet (800 mg total) by mouth every 8 (eight) hours as needed for headache (Take with food.).    Dispense:  20 tablet    Refill:  0    Order Specific Question:   Supervising Provider    Answer:   Tresa Garter [id number]  . capsaicin (ZOSTRIX) 0.025 % cream    Sig: Apply topically 2 (two) times daily as needed.    Dispense:  60 g    Refill:  0    Order Specific Question:   Supervising Provider    Answer:   Tresa Garter [id number]  . Melatonin 5 MG CAPS    Sig: Take 1 capsule (5 mg total) by mouth at bedtime.    Refill:  0    Order Specific Question:   Supervising Provider    Answer:   Tresa Garter [id number]  . terbinafine (LAMISIL AT) 1 % cream    Sig: Apply topically 2 (two) times daily. Apply to affected areas for 14 days.    Dispense:  30 g    Refill:  0    Order Specific Question:   Supervising Provider    Answer:  Angelica Chessman E [[id number]]     Fredia Beets, FNP   `

## 2016-08-13 NOTE — Progress Notes (Signed)
Patient is here for a physical   Patient complains about jaw pain but its because teeth suppose to come out she stated that they taking them out tomorrow but other than that no other concerns  Patient has not taking her current medication for today  Patient has not eaten

## 2016-08-14 LAB — CMP14+EGFR
ALT: 16 IU/L (ref 0–32)
AST: 19 IU/L (ref 0–40)
Albumin/Globulin Ratio: 1.3 (ref 1.2–2.2)
Albumin: 4.3 g/dL (ref 3.5–5.5)
Alkaline Phosphatase: 71 IU/L (ref 39–117)
BUN/Creatinine Ratio: 18 (ref 9–23)
BUN: 18 mg/dL (ref 6–24)
Bilirubin Total: 0.6 mg/dL (ref 0.0–1.2)
CALCIUM: 9.9 mg/dL (ref 8.7–10.2)
CO2: 24 mmol/L (ref 18–29)
CREATININE: 0.99 mg/dL (ref 0.57–1.00)
Chloride: 99 mmol/L (ref 96–106)
GFR calc Af Amer: 75 mL/min/{1.73_m2} (ref >59)
GFR, EST NON AFRICAN AMERICAN: 65 mL/min/{1.73_m2} (ref >59)
GLOBULIN, TOTAL: 3.4 g/dL (ref 1.5–4.5)
Glucose: 93 mg/dL (ref 65–99)
Potassium: 4.3 mmol/L (ref 3.5–5.2)
SODIUM: 139 mmol/L (ref 134–144)
TOTAL PROTEIN: 7.7 g/dL (ref 6.0–8.5)

## 2016-08-14 LAB — LIPID PANEL
Chol/HDL Ratio: 2.5 ratio (ref 0.0–4.4)
Cholesterol, Total: 197 mg/dL (ref 100–199)
HDL: 78 mg/dL (ref >39)
LDL Calculated: 100 mg/dL — ABNORMAL HIGH (ref 0–99)
TRIGLYCERIDES: 97 mg/dL (ref 0–149)
VLDL Cholesterol Cal: 19 mg/dL (ref 5–40)

## 2016-08-14 LAB — HEMOGLOBIN A1C
ESTIMATED AVERAGE GLUCOSE: 114 mg/dL
HEMOGLOBIN A1C: 5.6 % (ref 4.8–5.6)

## 2016-08-14 LAB — HEPATITIS C ANTIBODY: Hep C Virus Ab: <0.1 s/co ratio (ref 0.0–0.9)

## 2016-08-14 LAB — TSH: TSH: 0.855 u[IU]/mL (ref 0.450–4.500)

## 2016-08-14 LAB — VITAMIN D 25 HYDROXY (VIT D DEFICIENCY, FRACTURES): VIT D 25 HYDROXY: 38.5 ng/mL (ref 30.0–100.0)

## 2016-08-14 LAB — HIV ANTIBODY (ROUTINE TESTING W REFLEX): HIV Screen 4th Generation wRfx: NONREACTIVE

## 2016-08-14 NOTE — Telephone Encounter (Signed)
CMA call patient to inform lab results  Patient Verify DOB  Patient was aware and understood

## 2016-08-14 NOTE — Telephone Encounter (Signed)
-----   Message from Alfonse Spruce, [place] sent at 08/14/2016  9:12 AM EDT ----- HIV negative Hepatitis C negative Cholesterol levels are normal. Liver function normal. Kidney function normal Thyroid function normal HgbA1c is normal. You do not have diabetes. Vitamin D level is normal. Vitamin D helps to keep bones strong.

## 2016-08-15 NOTE — Progress Notes (Signed)
Patient left without being seen by PCP.  This encounter was created in error - please disregard.

## 2016-08-25 ENCOUNTER — Encounter: Payer: Medicaid Other | Admitting: Internal Medicine

## 2016-09-30 ENCOUNTER — Telehealth: Payer: Self-pay | Admitting: Family Medicine

## 2016-09-30 NOTE — Telephone Encounter (Signed)
Ask she is exepriencing elevated BP's at home, and if so what range. Previous office visits have not indicated hypertension. If she is not experiencing elevated BP's recommend she schedule office visit for follow up.

## 2016-09-30 NOTE — Telephone Encounter (Signed)
Last order was by Dr. Annitta Needs - will forward to Fredia Beets, NP for approval.

## 2016-09-30 NOTE — Telephone Encounter (Signed)
Patient called the office requesting medication refill for lisinopril-hydrochlorothiazide (PRINZIDE,ZESTORETIC) 10-12.5 MG per tablet. Please send medication to [facility].  Thank you.

## 2016-10-01 NOTE — Telephone Encounter (Signed)
CMA call regarding to f/up with her Medication refill requested   Patient stated that she does not check her BP at home so I advice her to make an office visit so her pcp can evaluate her  Patient was aware and understood  Patient was going to make an office visit with [place]

## 2016-10-02 ENCOUNTER — Ambulatory Visit: Payer: Medicaid Other | Attending: Family Medicine | Admitting: Family Medicine

## 2016-10-02 VITALS — BP 111/76 | HR 93 | Temp 98.3°F | Resp 18 | Ht 62.0 in | Wt 215.8 lb

## 2016-10-02 DIAGNOSIS — I10 Essential (primary) hypertension: Secondary | ICD-10-CM | POA: Insufficient documentation

## 2016-10-02 DIAGNOSIS — Z7982 Long term (current) use of aspirin: Secondary | ICD-10-CM | POA: Insufficient documentation

## 2016-10-02 DIAGNOSIS — Z0000 Encounter for general adult medical examination without abnormal findings: Secondary | ICD-10-CM

## 2016-10-02 DIAGNOSIS — Z79899 Other long term (current) drug therapy: Secondary | ICD-10-CM | POA: Insufficient documentation

## 2016-10-02 DIAGNOSIS — B354 Tinea corporis: Secondary | ICD-10-CM | POA: Insufficient documentation

## 2016-10-02 LAB — POCT UA - MICROALBUMIN
Albumin/Creatinine Ratio, Urine, POC: <30
CREATININE, POC: 200 mg/dL
Microalbumin Ur, POC: 30 mg/L

## 2016-10-02 MED ORDER — LISINOPRIL-HYDROCHLOROTHIAZIDE 10-12.5 MG PO TABS: 1.0000 | ORAL_TABLET | Freq: Every day | ORAL | 2 refills | Status: DC

## 2016-10-02 MED ORDER — TERBINAFINE HCL 1 % EX CREA: TOPICAL_CREAM | Freq: Two times a day (BID) | CUTANEOUS | 0 refills | Status: DC

## 2016-10-02 MED ORDER — TERBINAFINE HCL 250 MG PO TABS: 250.0000 mg | ORAL_TABLET | Freq: Every day | ORAL | 0 refills | Status: DC

## 2016-10-02 NOTE — Patient Instructions (Signed)
Body Ringworm Body ringworm is an infection of the skin that often causes a ring-shaped rash. Body ringworm can affect any part of your skin. It can spread easily to others. Body ringworm is also called tinea corporis. What are the causes? This condition is caused by funguses called dermatophytes. The condition develops when these funguses grow out of control on the skin. You can get this condition if you touch a person or animal that has it. You can also get it if you share clothing, bedding, towels, or any other object with an infected person or pet. What increases the risk? This condition is more likely to develop in:  Athletes who often make skin-to-skin contact with other athletes, such as wrestlers.  People who share equipment and mats.  People with a weakened immune system. What are the signs or symptoms? Symptoms of this condition include:  Itchy, raised red spots and bumps.  Red scaly patches.  A ring-shaped rash. The rash may have:  A clear center.  Scales or red bumps at its center.  Redness near its borders.  Dry and scaly skin on or around it. How is this diagnosed? This condition can usually be diagnosed with a skin exam. A skin scraping may be taken from the affected area and examined under a microscope to see if the fungus is present. How is this treated? This condition may be treated with:  An antifungal cream or ointment.  An antifungal shampoo.  Antifungal medicines. These may be prescribed if your ringworm is severe, keeps coming back, or lasts a long time. Follow these instructions at home:  Take over-the-counter and prescription medicines only as told by your health care provider.  If you were given an antifungal cream or ointment:  Use it as told by your health care provider.  Wash the infected area and dry it completely before applying the cream or ointment.  If you were given an antifungal shampoo:  Use it as told by your health care  provider.  Leave the shampoo on your body for 3-5 minutes before rinsing.  While you have a rash:  Wear loose clothing to stop clothes from rubbing and irritating it.  Wash or change your bed sheets every night.  If your pet has the same infection, take your pet to see a Animal nutritionist. How is this prevented?  Practice good hygiene.  Wear sandals or shoes in public places and showers.  Do not share personal items with others.  Avoid touching red patches of skin on other people.  Avoid touching pets that have bald spots.  If you touch an animal that has a bald spot, wash your hands. Contact a health care provider if:  Your rash continues to spread after 7 days of treatment.  Your rash is not gone in 4 weeks.  The area around your rash gets red, warm, tender, and swollen. This information is not intended to replace advice given to you by your health care provider. Make sure you discuss any questions you have with your health care provider. Document Released: 04/25/2000 Document Revised: 10/04/2015 Document Reviewed: 02/22/2015 Elsevier Interactive Patient Education  2017 Elsevier Inc.  Terbinafine tablets What is this medicine? TERBINAFINE (TER bin a feen) is an antifungal medicine. It is used to treat certain kinds of fungal or yeast infections. This medicine may be used for other purposes; ask your health care provider or pharmacist if you have questions. COMMON BRAND NAME(S): Lamisil, Terbinex What should I tell my health care provider before  I take this medicine? They need to know if you have any of these conditions: -drink alcoholic beverages -kidney disease -liver disease -an unusual or allergic reaction to terbinafine, other medicines, foods, dyes, or preservatives -pregnant or trying to get pregnant -breast-feeding How should I use this medicine? Take this medicine by mouth with a full glass of water. Follow the directions on the prescription label. You can take  this medicine with food or on an empty stomach. Take your medicine at regular intervals. Do not take your medicine more often than directed. Do not skip doses or stop your medicine early even if you feel better. Do not stop taking except on your doctor's advice. Talk to your pediatrician regarding the use of this medicine in children. Special care may be needed. Overdosage: If you think you have taken too much of this medicine contact a poison control center or emergency room at once. NOTE: This medicine is only for you. Do not share this medicine with others. What if I miss a dose? If you miss a dose, take it as soon as you can. If it is almost time for your next dose, take only that dose. Do not take double or extra doses. What may interact with this medicine? Do not take this medicine with any of the following medications: -thioridazine This medicine may also interact with the following medications: -beta-blockers -caffeine -cimetidine -cyclosporine -medicines for depression, anxiety, or psychotic disturbances -medicines for fungal infections like fluconazole and ketoconazole -medicines for irregular heartbeat like amiodarone, flecainide and propafenone -rifampin -warfarin This list may not describe all possible interactions. Give your health care provider a list of all the medicines, herbs, non-prescription drugs, or dietary supplements you use. Also tell them if you smoke, drink alcohol, or use illegal drugs. Some items may interact with your medicine. What should I watch for while using this medicine? Visit your doctor or health care provider regularly. Tell your doctor right away if you have nausea or vomiting, loss of appetite, stomach pain on your right upper side, yellow skin, dark urine, light stools, or are over tired. Some fungal infections need many weeks or months of treatment to cure. If you are taking this medicine for a long time, you will need to have important blood work  done. What side effects may I notice from receiving this medicine? Side effects that you should report to your doctor or health care professional as soon as possible: -allergic reactions like skin rash or hives, swelling of the face, lips, or tongue -changes in vision -dark urine -fever or infection -general ill feeling or flu-like symptoms -light-colored stools -loss of appetite, nausea -redness, blistering, peeling or loosening of the skin, including inside the mouth -right upper belly pain -unusually weak or tired -yellowing of the eyes or skin Side effects that usually do not require medical attention (report to your doctor or health care professional if they continue or are bothersome): -changes in taste -diarrhea -hair loss -muscle or joint pain -stomach gas -stomach upset This list may not describe all possible side effects. Call your doctor for medical advice about side effects. You may report side effects to FDA at 1-800-FDA-1088. Where should I keep my medicine? Keep out of the reach of children. Store at room temperature below 25 degrees C (77 degrees F). Protect from light. Throw away any unused medicine after the expiration date. NOTE: This sheet is a summary. It may not cover all possible information. If you have questions about this medicine, talk to  your doctor, pharmacist, or health care provider.  2018 Elsevier/Gold Standard (2007-07-09 16:28:07)

## 2016-10-02 NOTE — Progress Notes (Signed)
Patient is here for BP check & medication  Patient denies pain for today   Patient complains about a rash on her right ankle   Patient has not taking her lisinopril for today   Patient has not eaten for today

## 2016-10-02 NOTE — Progress Notes (Signed)
Subjective:  Patient ID: Ann [name], female    DOB: 1961-02-06  Age: 56 y.o. MRN: [id number]  CC: Establish Care   HPI Ann [name] presents for  Hypertension: Patient here for follow-up of elevated blood pressure. She is not exercising and is adherent to low salt diet.  Blood pressure: She does not check blood pressures at home.  Cardiac symptoms none. Patient denies chest pain, claudication, lower extremity edema, near-syncope and palpitations.  Cardiovascular risk factors: hypertension, obesity (BMI >= 30 kg/m2) and sedentary lifestyle. Use of agents associated with hypertension: NSAIDS. History of target organ damage: none.  Tinea: Patient complains of probable tinea. Lesion is located on the right, anterior ankle.  She reports pruritic rash. Symptoms have been ongoing for about 2 months. Previous evaluation and treatment has included prescription for antifungal cream. She reports not picking up antifungal cream.    Outpatient Medications Prior to Visit  Medication Sig Dispense Refill  . aspirin EC 81 MG tablet Take 81 mg by mouth daily.    . Multiple Vitamin (MULTIVITAMIN WITH MINERALS) TABS tablet Take 1 tablet by mouth daily.    Marland Kitchen lisinopril-hydrochlorothiazide (PRINZIDE,ZESTORETIC) 10-12.5 MG per tablet Take 1 tablet by mouth daily. 90 tablet 2  . acetaminophen (TYLENOL) 500 MG tablet Take 1,000 mg by mouth every 6 (six) hours as needed (for headache).    . capsaicin (ZOSTRIX) 0.025 % cream Apply topically 2 (two) times daily as needed. 60 g 0  . cyclobenzaprine (FLEXERIL) 10 MG tablet Take 1 tablet (10 mg total) by mouth 2 (two) times daily as needed for muscle spasms. (Patient not taking: Reported on 07/01/2016) 20 tablet 0  . diclofenac (VOLTAREN) 75 MG EC tablet Take 1 tablet (75 mg total) by mouth 2 (two) times daily. (Patient not taking: Reported on 07/01/2016) 20 tablet 0  . estradiol (ESTRACE) 1 MG tablet Take 1 tablet (1 mg total) by mouth daily. (Patient not taking:  Reported on 04/01/2016) 30 tablet 1  . gabapentin (NEURONTIN) 300 MG capsule Take 300 mg by mouth at bedtime as needed. Reported on 10/05/2015    . hydrocortisone 2.5 % cream Apply topically as needed (FOR ECZEMA). Reported on 10/05/2015    . ibuprofen (ADVIL,MOTRIN) 800 MG tablet Take 1 tablet (800 mg total) by mouth every 8 (eight) hours as needed for headache (Take with food.). 20 tablet 0  . LORazepam (ATIVAN) 0.5 MG tablet Take 1 tablet (0.5 mg total) by mouth as needed for anxiety or sleep. (Patient not taking: Reported on 04/01/2016) 30 tablet 0  . Melatonin 5 MG CAPS Take 1 capsule (5 mg total) by mouth at bedtime.  0  . ranitidine (ZANTAC) 150 MG capsule Take 1 capsule (150 mg total) by mouth daily. (Patient not taking: Reported on 07/01/2016) 30 capsule 1  . sertraline (ZOLOFT) 25 MG tablet Take 1 tablet (25 mg total) by mouth daily. (Patient not taking: Reported on 04/01/2016) 30 tablet 3  . simethicone (MYLICON) 782 MG chewable tablet Chew 1 tablet (125 mg total) by mouth every 6 (six) hours as needed for flatulence.  0  . terbinafine (LAMISIL AT) 1 % cream Apply topically 2 (two) times daily. Apply to affected areas for 14 days. 30 g 0   No facility-administered medications prior to visit.     ROS Review of Systems  Constitutional: Negative.   Eyes: Negative.   Respiratory: Negative.   Cardiovascular: Negative.   Gastrointestinal: Negative.   Skin: Positive for rash.  Neurological: Negative.  Psychiatric/Behavioral: Negative.     Objective:  BP 111/76 (BP Location: Left Arm, Patient Position: Sitting, Cuff Size: Normal)   Pulse 93   Temp 98.3 F (36.8 C) (Oral)   Resp 18   Ht 5\' 2"  (1.575 m)   Wt 215 lb 12.8 oz (97.9 kg)   LMP 10/03/2012   SpO2 96%   BMI 39.47 kg/m   BP/Weight 10/02/2016 08/13/2016 8/67/6195  Systolic BP 093 267 124  Diastolic BP 76 83 76  Wt. (Lbs) 215.8 222.2 224.8  BMI 39.47 38.14 41.12     Physical Exam  Constitutional: She is oriented to  person, place, and time. She appears well-developed and well-nourished.  Eyes: Conjunctivae are normal. Pupils are equal, round, and reactive to light.  Neck: No JVD present.  Cardiovascular: Normal rate, regular rhythm, normal heart sounds and intact distal pulses.   Pulmonary/Chest: Effort normal and breath sounds normal.  Abdominal: Soft. Bowel sounds are normal.  Neurological: She is alert and oriented to person, place, and time.  Skin: Skin is warm and dry. Rash noted.     Psychiatric: She has a normal mood and affect.  Nursing note and vitals reviewed.    Assessment & Plan:   Problem List Items Addressed This Visit      Cardiovascular and Mediastinum   Hypertension - Primary   Hypertension is well controlled on current  blood pressure medications.    Relevant Medications   lisinopril-hydrochlorothiazide (PRINZIDE,ZESTORETIC) 10-12.5 MG tablet   Other Relevant Orders   POCT UA - Microalbumin (Completed)    Other Visit Diagnoses    Tinea corporis       Relevant Medications   terbinafine (LAMISIL AT) 1 % cream   terbinafine (LAMISIL) 250 MG tablet   Healthcare maintenance       Relevant Orders   Ambulatory referral to Gastroenterology      Meds ordered this encounter  Medications  . terbinafine (LAMISIL AT) 1 % cream    Sig: Apply topically 2 (two) times daily. Apply to affected areas for 14 days.    Dispense:  30 g    Refill:  0    Order Specific Question:   Supervising Provider    Answer:   Tresa Garter [id number]  . lisinopril-hydrochlorothiazide (PRINZIDE,ZESTORETIC) 10-12.5 MG tablet    Sig: Take 1 tablet by mouth daily.    Dispense:  90 tablet    Refill:  2    Order Specific Question:   Supervising Provider    Answer:   Tresa Garter [id number]  . terbinafine (LAMISIL) 250 MG tablet    Sig: Take 1 tablet (250 mg total) by mouth daily.    Dispense:  14 tablet    Refill:  0    Order Specific Question:   Supervising Provider    Answer:    Tresa Garter [id number]    Follow-up: Return in about 2 weeks (around 10/16/2016) for PAP .   Alfonse Spruce FNP

## 2016-10-08 ENCOUNTER — Encounter: Payer: Medicaid Other | Admitting: Internal Medicine

## 2016-10-10 ENCOUNTER — Ambulatory Visit: Payer: Medicaid Other | Admitting: Family Medicine

## 2016-11-28 ENCOUNTER — Ambulatory Visit: Payer: Self-pay | Admitting: Licensed Clinical Social Worker

## 2016-11-28 ENCOUNTER — Ambulatory Visit: Payer: Self-pay | Attending: Family Medicine | Admitting: Family Medicine

## 2016-11-28 ENCOUNTER — Encounter: Payer: Self-pay | Admitting: Family Medicine

## 2016-11-28 VITALS — BP 103/71 | HR 81 | Temp 98.7°F | Resp 18 | Ht 62.0 in | Wt 213.2 lb

## 2016-11-28 DIAGNOSIS — F419 Anxiety disorder, unspecified: Principal | ICD-10-CM

## 2016-11-28 DIAGNOSIS — R4589 Other symptoms and signs involving emotional state: Secondary | ICD-10-CM

## 2016-11-28 DIAGNOSIS — K047 Periapical abscess without sinus: Secondary | ICD-10-CM | POA: Insufficient documentation

## 2016-11-28 DIAGNOSIS — F329 Major depressive disorder, single episode, unspecified: Secondary | ICD-10-CM | POA: Insufficient documentation

## 2016-11-28 DIAGNOSIS — Z7982 Long term (current) use of aspirin: Secondary | ICD-10-CM | POA: Insufficient documentation

## 2016-11-28 DIAGNOSIS — Z79899 Other long term (current) drug therapy: Secondary | ICD-10-CM | POA: Insufficient documentation

## 2016-11-28 MED ORDER — BENZOCAINE 10 % MT GEL: 1.0000 "application " | OROMUCOSAL | 0 refills | Status: DC | PRN

## 2016-11-28 MED ORDER — PENICILLIN V POTASSIUM 500 MG PO TABS: 500.0000 mg | ORAL_TABLET | Freq: Four times a day (QID) | ORAL | 0 refills | Status: DC

## 2016-11-28 MED ORDER — TRAMADOL HCL 50 MG PO TABS: 50.0000 mg | ORAL_TABLET | Freq: Three times a day (TID) | ORAL | 0 refills | Status: DC | PRN

## 2016-11-28 MED FILL — PENICILLIN VK 500 MG TABLET: 500 | 10 days supply | Qty: 40 | Fill #0

## 2016-11-28 NOTE — Progress Notes (Signed)
Patient is here for jaw & gum pain/ionfection   Pain started Tuesday & swollen on the next day

## 2016-11-28 NOTE — Progress Notes (Signed)
Subjective:  Patient ID: Ann [name], female    DOB: [birth year]  Age: 56 y.o. MRN: [id number]  CC: Dental Problem   HPI Ann [name] presents for complaints of gum swelling that began Tuesday. She reports history of decayed tooth. Symptoms include swelling of the hard palate and occasional purulent drainage. She denies any difficulty breathing, throat swelling, or swelling of the tongue.  She complains of depressed mood.  She denies current suicidal and homicidal plan or intent. She denies any previous history. She is agreeable to speaking to the LCSW at this time. She declines medication at this time.    Outpatient Medications Prior to Visit  Medication Sig Dispense Refill  . aspirin EC 81 MG tablet Take 81 mg by mouth daily.    Marland Kitchen lisinopril-hydrochlorothiazide (PRINZIDE,ZESTORETIC) 10-12.5 MG tablet Take 1 tablet by mouth daily. 90 tablet 2  . acetaminophen (TYLENOL) 500 MG tablet Take 1,000 mg by mouth every 6 (six) hours as needed (for headache).    . capsaicin (ZOSTRIX) 0.025 % cream Apply topically 2 (two) times daily as needed. 60 g 0  . cyclobenzaprine (FLEXERIL) 10 MG tablet Take 1 tablet (10 mg total) by mouth 2 (two) times daily as needed for muscle spasms. (Patient not taking: Reported on 07/01/2016) 20 tablet 0  . diclofenac (VOLTAREN) 75 MG EC tablet Take 1 tablet (75 mg total) by mouth 2 (two) times daily. (Patient not taking: Reported on 07/01/2016) 20 tablet 0  . estradiol (ESTRACE) 1 MG tablet Take 1 tablet (1 mg total) by mouth daily. (Patient not taking: Reported on 04/01/2016) 30 tablet 1  . gabapentin (NEURONTIN) 300 MG capsule Take 300 mg by mouth at bedtime as needed. Reported on 10/05/2015    . hydrocortisone 2.5 % cream Apply topically as needed (FOR ECZEMA). Reported on 10/05/2015    . ibuprofen (ADVIL,MOTRIN) 800 MG tablet Take 1 tablet (800 mg total) by mouth every 8 (eight) hours as needed for headache (Take with food.). 20 tablet 0  . LORazepam (ATIVAN) 0.5  MG tablet Take 1 tablet (0.5 mg total) by mouth as needed for anxiety or sleep. (Patient not taking: Reported on 04/01/2016) 30 tablet 0  . Melatonin 5 MG CAPS Take 1 capsule (5 mg total) by mouth at bedtime.  0  . Multiple Vitamin (MULTIVITAMIN WITH MINERALS) TABS tablet Take 1 tablet by mouth daily.    . ranitidine (ZANTAC) 150 MG capsule Take 1 capsule (150 mg total) by mouth daily. (Patient not taking: Reported on 07/01/2016) 30 capsule 1  . sertraline (ZOLOFT) 25 MG tablet Take 1 tablet (25 mg total) by mouth daily. (Patient not taking: Reported on 04/01/2016) 30 tablet 3  . simethicone (MYLICON) 614 MG chewable tablet Chew 1 tablet (125 mg total) by mouth every 6 (six) hours as needed for flatulence.  0  . terbinafine (LAMISIL AT) 1 % cream Apply topically 2 (two) times daily. Apply to affected areas for 14 days. 30 g 0  . terbinafine (LAMISIL) 250 MG tablet Take 1 tablet (250 mg total) by mouth daily. 14 tablet 0   No facility-administered medications prior to visit.     ROS Review of Systems  Constitutional: Negative.   HENT: Positive for dental problem.   Respiratory: Negative.   Cardiovascular: Negative.   Psychiatric/Behavioral: Positive for dysphoric mood. Negative for suicidal ideas.        Objective:  BP 103/71 (BP Location: Left Arm, Patient Position: Sitting, Cuff Size: Normal)   Pulse 81  Temp 98.7 F (37.1 C) (Oral)   Resp 18   Ht 5\' 2"  (1.575 m)   Wt 213 lb 3.2 oz (96.7 kg)   LMP 10/03/2012   SpO2 98%   BMI 38.99 kg/m   BP/Weight 11/28/2016 9/79/4801 10/14/5372  Systolic BP 827 078 675  Diastolic BP 71 76 83  Wt. (Lbs) 213.2 215.8 222.2  BMI 38.99 39.47 38.14     Physical Exam  Constitutional: She appears well-developed and well-nourished.  HENT:  Head: Normocephalic and atraumatic.  Right Ear: External ear normal.  Left Ear: External ear normal.  Nose: Nose normal.  Mouth/Throat: Dental caries present.    Eyes: Pupils are equal, round, and  reactive to light. Conjunctivae are normal.  Neck: No JVD present.  Cardiovascular: Normal rate, regular rhythm, normal heart sounds and intact distal pulses.   Pulmonary/Chest: Effort normal and breath sounds normal.  Nursing note and vitals reviewed.    Assessment & Plan:   Problem List Items Addressed This Visit    None    Visit Diagnoses    Dental abscess    -  Primary   Relevant Medications   penicillin v potassium (VEETID) 500 MG tablet   traMADol (ULTRAM) 50 MG tablet   benzocaine (ORAJEL) 10 % mucosal gel   Other Relevant Orders   Ambulatory referral to Dentistry   Depressed mood            LCSW spoke with patient and provide counseling resources .        Meds ordered this encounter  Medications  . penicillin v potassium (VEETID) 500 MG tablet    Sig: Take 1 tablet (500 mg total) by mouth 4 (four) times daily.    Dispense:  40 tablet    Refill:  0    Order Specific Question:   Supervising Provider    Answer:   Tresa Garter [id number]  . traMADol (ULTRAM) 50 MG tablet    Sig: Take 1 tablet (50 mg total) by mouth every 8 (eight) hours as needed.    Dispense:  12 tablet    Refill:  0    Order Specific Question:   Supervising Provider    Answer:   Tresa Garter [id number]  . benzocaine (ORAJEL) 10 % mucosal gel    Sig: Use as directed 1 application in the mouth or throat as needed for mouth pain.    Dispense:  5.3 g    Refill:  0    Order Specific Question:   Supervising Provider    Answer:   Tresa Garter [id number]    Follow-up: Return if symptoms worsen or fail to improve.   Alfonse Spruce FNP

## 2016-11-28 NOTE — BH Specialist Note (Signed)
Integrated Behavioral Health Initial Visit  MRN: [id number] Name: Ann [name]   Session Start time: 9:45 AM Session End time: 10:15 AM Total time: 30 minutes  Type of Service: [place] Interpretor:No. Interpretor Name and Language: N/A   Warm Hand Off Completed.       SUBJECTIVE: Ann [name] is a 56 y.o. female accompanied by patient. Patient was referred by FNP [place] for depression and anxiety. Patient reports the following symptoms/concerns: feelings of sadness and worry, difficulty sleeping, low energy, and feeling bad about self Duration of problem: approximately four months; Severity of problem: mild  OBJECTIVE: Mood: Depressed and Affect: Appropriate and Depressed Risk of harm to self or others: No plan to harm self or others   LIFE CONTEXT: Family and Social: Pt receives emotional support from family and friends. She provides support to her elderly parents residing in the home, her adult children (19 and 40 yo) and her minor grandson School/Work: Pt has been unemployed for four months. She receives food stamps ($200) Self-Care: Pt prays, talks with family, and listens to music (jazz and gospel) to cope with stressors Life Changes: Pt is experiencing financial strain and caregiver stress  GOALS ADDRESSED: Patient will reduce symptoms of: anxiety and depression and increase knowledge and/or ability of: coping skills and also: Increase adequate support systems for patient/family   INTERVENTIONS: Solution-Focused Strategies, Supportive Counseling, Psychoeducation and/or Health Education and Link to [employer]  Standardized Assessments completed: GAD-7 and PHQ 2&9  ASSESSMENT: Patient currently experiencing depression and anxiety triggered by caregiver stress and financial strain. She reports feelings of sadness and worry, difficulty sleeping, low energy, and feeling bad about self. Patient receives emotional  support from family and friends. Patient may benefit from psychoeducation and psychotherapy. [place] educated pt on how stress can negatively impact one's physical and mental health. LCSWA discussed benefits of applying healthy coping skills to decrease symptoms. Pt successfully identified healthy strategies to utilize on a routine basis. LCSWA provided pt with resources for food insecurity, utility assistance, and employment assistance. Pt is not interested in psychotherapy or medication management at this time.  PLAN: 1. Follow up with behavioral health clinician on : Pt was encouraged to contact LCSWA if symptoms worsen or fail to improve to schedule behavioral appointments at [facility]. 2. Behavioral recommendations: LCSWA recommends that pt apply healthy coping skills discussed and utilize provided resources. Pt is encouraged to schedule follow up appointment with LCSWA 3. Referral(s): Community Resources:  Food, Publishing rights manager and utility assistance 4. "From scale of 1-10, how likely are you to follow plan?": [place] Ann Wedin, LCSW 12/02/16 10:25 AM

## 2016-11-28 NOTE — Patient Instructions (Addendum)
Apply for orange card.  Dental Abscess A dental abscess is a collection of pus in or around a tooth. What are the causes? This condition is caused by a bacterial infection around the root of the tooth that involves the inner part of the tooth (pulp). It may result from:  Severe tooth decay.  Trauma to the tooth that allows bacteria to enter into the pulp, such as a broken or chipped tooth.  Severe gum disease around a tooth.  What are the signs or symptoms? Symptoms of this condition include:  Severe pain in and around the infected tooth.  Swelling and redness around the infected tooth, in the mouth, or in the face.  Tenderness.  Pus drainage.  Bad breath.  Bitter taste in the mouth.  Difficulty swallowing.  Difficulty opening the mouth.  Nausea.  Vomiting.  Chills.  Swollen neck glands.  Fever.  How is this diagnosed? This condition is diagnosed with examination of the infected tooth. During the exam, your dentist may tap on the infected tooth. Your dentist will also ask about your medical and dental history and may order X-rays. How is this treated? This condition is treated by eliminating the infection. This may be done with:  Antibiotic medicine.  A root canal. This may be performed to save the tooth.  Pulling (extracting) the tooth. This may also involve draining the abscess. This is done if the tooth cannot be saved.  Follow these instructions at home:  Take medicines only as directed by your dentist.  If you were prescribed antibiotic medicine, finish all of it even if you start to feel better.  Rinse your mouth (gargle) often with salt water to relieve pain or swelling.  Do not drive or operate heavy machinery while taking pain medicine.  Do not apply heat to the outside of your mouth.  Keep all follow-up visits as directed by your dentist. This is important. Contact a health care provider if:  Your pain is worse and is not helped by  medicine. Get help right away if:  You have a fever or chills.  Your symptoms suddenly get worse.  You have a very bad headache.  You have problems breathing or swallowing.  You have trouble opening your mouth.  You have swelling in your neck or around your eye. This information is not intended to replace advice given to you by your health care provider. Make sure you discuss any questions you have with your health care provider. Document Released: 04/28/2005 Document Revised: 09/06/2015 Document Reviewed: 04/25/2014 Elsevier Interactive Patient Education  2017 [employer].

## 2017-01-08 ENCOUNTER — Other Ambulatory Visit: Payer: Self-pay | Admitting: Obstetrics & Gynecology

## 2017-01-08 DIAGNOSIS — Z1231 Encounter for screening mammogram for malignant neoplasm of breast: Secondary | ICD-10-CM

## 2017-01-15 ENCOUNTER — Ambulatory Visit: Payer: Self-pay | Admitting: Family Medicine

## 2017-02-02 ENCOUNTER — Ambulatory Visit: Payer: Self-pay | Admitting: Family Medicine

## 2017-02-23 ENCOUNTER — Encounter: Payer: Self-pay | Admitting: Physician Assistant

## 2017-02-23 ENCOUNTER — Ambulatory Visit: Payer: Self-pay | Attending: Family Medicine | Admitting: Physician Assistant

## 2017-02-23 VITALS — BP 121/78 | HR 94 | Temp 98.6°F | Resp 18 | Ht 62.0 in | Wt 216.0 lb

## 2017-02-23 DIAGNOSIS — I10 Essential (primary) hypertension: Secondary | ICD-10-CM | POA: Insufficient documentation

## 2017-02-23 DIAGNOSIS — K219 Gastro-esophageal reflux disease without esophagitis: Secondary | ICD-10-CM | POA: Insufficient documentation

## 2017-02-23 DIAGNOSIS — F419 Anxiety disorder, unspecified: Secondary | ICD-10-CM | POA: Insufficient documentation

## 2017-02-23 DIAGNOSIS — M25552 Pain in left hip: Secondary | ICD-10-CM

## 2017-02-23 DIAGNOSIS — M542 Cervicalgia: Secondary | ICD-10-CM | POA: Insufficient documentation

## 2017-02-23 DIAGNOSIS — J029 Acute pharyngitis, unspecified: Secondary | ICD-10-CM | POA: Insufficient documentation

## 2017-02-23 DIAGNOSIS — Z7982 Long term (current) use of aspirin: Secondary | ICD-10-CM | POA: Insufficient documentation

## 2017-02-23 DIAGNOSIS — Z79899 Other long term (current) drug therapy: Secondary | ICD-10-CM | POA: Insufficient documentation

## 2017-02-23 DIAGNOSIS — M62838 Other muscle spasm: Secondary | ICD-10-CM | POA: Insufficient documentation

## 2017-02-23 LAB — POCT RAPID STREP A (OFFICE): RAPID STREP A SCREEN: NEGATIVE

## 2017-02-23 MED ORDER — IBUPROFEN 800 MG PO TABS: 800.0000 mg | ORAL_TABLET | Freq: Three times a day (TID) | ORAL | 0 refills | Status: DC | PRN

## 2017-02-23 MED ORDER — METHOCARBAMOL 500 MG PO TABS: 500.0000 mg | ORAL_TABLET | Freq: Four times a day (QID) | ORAL | 0 refills | Status: DC

## 2017-02-23 MED FILL — METHOCARBAMOL 500 MG TABS: 500 | 22 days supply | Qty: 90 | Fill #0

## 2017-02-23 MED FILL — IBUPROFEN 800 MG TABLET: 800 | 10 days supply | Qty: 30 | Fill #0

## 2017-02-23 NOTE — Progress Notes (Signed)
Patient ID: Ann [name], female   DOB: [birth year], 57 y.o.   MRN: [id number]    Ann [name], is a 56 y.o. female  ZHG:[id number]  HDQ:[id number]  DOB - 1960/07/23  Subjective:  Chief Complaint and HPI: Ann [name] is a 56 y.o. female here today for  R sided neck pain that started in the day on Friday(3 days ago).  No f/c.  Pain shoots from R back of neck into R nostril and R side of throat.  Throat at times feels sore.  No N/V/D.  No f/c.  2 yr old grandson is sick with a cold.  She has limited ROM with her neck. biofreeze OTC didn't help.  Tylenol helps a little.  Pain is relieved slightly by tilting her head back.  Some R sided sinus congestion.    No HA.  No vision changes    ROS:   Constitutional:  No f/c, No night sweats, No unexplained weight loss. EENT:  No vision changes, No blurry vision, No hearing changes. +sore throat and R ear pain Respiratory: No cough, No SOB Cardiac: No CP, no palpitations GI:  No abd pain, No N/V/D. GU: No Urinary s/sx Musculoskeletal: No joint pain Neuro: No headache, no dizziness, no motor weakness.  Skin: No rash Endocrine:  No polydipsia. No polyuria.  Psych: Denies SI/HI  No problems updated.  ALLERGIES: No Known Allergies  PAST MEDICAL HISTORY: Past Medical History:  Diagnosis Date  . Anemia   . Anxiety   . Dysrhythmia    palpitations  . GERD (gastroesophageal reflux disease)   . Headache(784.0)   . Hypertension     MEDICATIONS AT HOME: Prior to Admission medications   Medication Sig Start Date End Date Taking? Authorizing Provider  acetaminophen (TYLENOL) 500 MG tablet Take 1,000 mg by mouth every 6 (six) hours as needed (for headache).   Yes [provider]  aspirin EC 81 MG tablet Take 81 mg by mouth daily.   Yes [provider]  ibuprofen (ADVIL,MOTRIN) 800 MG tablet Take 1 tablet (800 mg total) by mouth every 8 (eight) hours as needed for headache (Take with food.). 02/23/17  Yes Kya Mayfield M,  PA-C  lisinopril-hydrochlorothiazide (PRINZIDE,ZESTORETIC) 10-12.5 MG tablet Take 1 tablet by mouth daily. 10/02/16  Yes Hairston, Maylon Peppers, FNP  Melatonin 5 MG CAPS Take 1 capsule (5 mg total) by mouth at bedtime. 08/13/16  Yes Hairston, Maylon Peppers, FNP  Multiple Vitamin (MULTIVITAMIN WITH MINERALS) TABS tablet Take 1 tablet by mouth daily.   Yes [provider]  ranitidine (ZANTAC) 150 MG capsule Take 1 capsule (150 mg total) by mouth daily. 02/17/13  Yes Pamella Pert, MD  benzocaine (ORAJEL) 10 % mucosal gel Use as directed 1 application in the mouth or throat as needed for mouth pain. Patient not taking: Reported on 02/23/2017 11/28/16   Alfonse Spruce, FNP  capsaicin (ZOSTRIX) 0.025 % cream Apply topically 2 (two) times daily as needed. Patient not taking: Reported on 02/23/2017 08/13/16   Alfonse Spruce, FNP  methocarbamol (ROBAXIN) 500 MG tablet Take 1 tablet (500 mg total) by mouth 4 (four) times daily. Prn muscle spasm 02/23/17   Freeman Caldron M, PA-C  sertraline (ZOLOFT) 25 MG tablet Take 1 tablet (25 mg total) by mouth daily. Patient not taking: Reported on 04/01/2016 11/18/13   Lorayne Marek, MD  simethicone (MYLICON) 119 MG chewable tablet Chew 1 tablet (125 mg total) by mouth every 6 (six) hours as needed for flatulence. Patient not taking:  Reported on 02/23/2017 07/01/16   Alfonse Spruce, FNP  terbinafine (LAMISIL AT) 1 % cream Apply topically 2 (two) times daily. Apply to affected areas for 14 days. Patient not taking: Reported on 02/23/2017 10/02/16   Alfonse Spruce, FNP  terbinafine (LAMISIL) 250 MG tablet Take 1 tablet (250 mg total) by mouth daily. Patient not taking: Reported on 02/23/2017 10/02/16   Alfonse Spruce, FNP  traMADol (ULTRAM) 50 MG tablet Take 1 tablet (50 mg total) by mouth every 8 (eight) hours as needed. Patient not taking: Reported on 02/23/2017 11/28/16   Alfonse Spruce, FNP     Objective:  EXAM:   Vitals:    02/23/17 1451  BP: 121/78  Pulse: 94  Resp: 18  Temp: 98.6 F (37 C)  TempSrc: Oral  SpO2: 100%  Weight: 216 lb (98 kg)  [name]: 5\' 2"  (1.575 m)    General appearance : A&OX3. NAD. Non-toxic-appearing.  Voice is normal/no drooling or hot potato voice.   HEENT: Atraumatic and Normocephalic.  PERRLA. EOM intact.  TM clear B but R TM is bulging slightly. Mouth-MMM, post pharynx R tonsil is swollen and erythematous w/o exudate, uvula is midline, airway is patent. No PND. Neck: supple, no JVD. No cervical lymphadenopathy. No thyromegaly.  No nuchal regidity Chest/Lungs:  Breathing-non-labored, Good air entry bilaterally, breath sounds normal without rales, rhonchi, or wheezing  CVS: S1 S2 regular, no murmurs, gallops, rubs  Extremities: Bilateral Lower Ext shows no edema, both legs are warm to touch with = pulse throughout Neurology:  CN II-XII grossly intact, Non focal.   Psych:  TP linear. J/I WNL. Normal speech. Appropriate eye contact and affect.  Skin:  No Rash  Data Review Lab Results  Component Value Date   HGBA1C 5.6 08/13/2016   HGBA1C 5.3 11/17/2012     Assessment & Plan   1. Pharyngitis, unspecified etiology No signs epiglottitis Await culture-probable viral syndrome - POCT rapid strep A - Culture, Group A Strep - CBC with Differential/Platelet To ED if worsens/spikes fever-no red flags currently  2. Neck pain - ibuprofen (ADVIL,MOTRIN) 800 MG tablet; Take 1 tablet (800 mg total) by mouth every 8 (eight) hours as needed for headache (Take with food.).  Dispense: 30 tablet; Refill: 0  3. Muscle spasm Methocarbamol sent - ibuprofen (ADVIL,MOTRIN) 800 MG tablet; Take 1 tablet (800 mg total) by mouth every 8 (eight) hours as needed for headache (Take with food.).  Dispense: 30 tablet; Refill: 0     Patient have been counseled extensively about nutrition and exercise  Return in about 2 weeks (around 03/09/2017) for [facility]; f/up neck pain.  The patient was  given clear instructions to go to ER or return to medical center if symptoms don't improve, worsen or new problems develop. The patient verbalized understanding. The patient was told to call to get lab results if they haven't heard anything in the next week.     Freeman Caldron, PA-C [facility] and [facility], [place]   02/23/2017, 3:13 PM

## 2017-02-23 NOTE — Patient Instructions (Signed)

## 2017-02-24 ENCOUNTER — Telehealth: Payer: Self-pay | Admitting: *Deleted

## 2017-02-24 LAB — CBC WITH DIFFERENTIAL/PLATELET
BASOS: 0 %
Basophils Absolute: 0 10*3/uL (ref 0.0–0.2)
EOS (ABSOLUTE): 0.1 10*3/uL (ref 0.0–0.4)
Eos: 2 %
Hematocrit: 41.3 % (ref 34.0–46.6)
Hemoglobin: 13.2 g/dL (ref 11.1–15.9)
IMMATURE GRANS (ABS): 0 10*3/uL (ref 0.0–0.1)
Immature Granulocytes: 0 %
LYMPHS: 18 %
Lymphocytes Absolute: 1.2 10*3/uL (ref 0.7–3.1)
MCH: 28.3 pg (ref 26.6–33.0)
MCHC: 32 g/dL (ref 31.5–35.7)
MCV: 89 fL (ref 79–97)
MONOS ABS: 0.9 10*3/uL (ref 0.1–0.9)
Monocytes: 15 %
NEUTROS ABS: 4.2 10*3/uL (ref 1.4–7.0)
Neutrophils: 65 %
PLATELETS: 244 10*3/uL (ref 150–379)
RBC: 4.66 x10E6/uL (ref 3.77–5.28)
RDW: 14.9 % (ref 12.3–15.4)
WBC: 6.4 10*3/uL (ref 3.4–10.8)

## 2017-02-24 NOTE — Telephone Encounter (Signed)
Patient verified DOB Patient is aware of blood cont being normal which most likely indicates a viral infection. Patient is awaiting culture results. Patient complains of fever and chills on setting last night. Patient will return in a week for the flu vaccine after cold symptoms subside.

## 2017-02-24 NOTE — Telephone Encounter (Signed)
-----   Message from Argentina Donovan, [place] sent at 02/24/2017  8:48 AM EDT ----- Your blood count is normal.  This is reassuring that you likely have a virus.  I am still awaiting your throat culture. Jethro Poling, PA-C

## 2017-02-25 LAB — CULTURE, GROUP A STREP: Strep A Culture: NEGATIVE

## 2017-02-26 ENCOUNTER — Telehealth: Payer: Self-pay | Admitting: *Deleted

## 2017-02-26 NOTE — Telephone Encounter (Signed)
-----   Message from Argentina Donovan, [place] sent at 02/25/2017 10:29 PM EDT ----- Your strep culture of the throat was negative.  This is all likely viral as we discussed.  Return to clinic if you continue to have problems or go to the ED if your symptoms become severe.   Thanks, Freeman Caldron, PA-C

## 2017-02-26 NOTE — Telephone Encounter (Signed)
Medical Assistant left message on patient's home and cell voicemail. Voicemail states to give a call back to [place] with [facility] at [phone]. !!!Please inform patient of strep culture being negative and the symptoms are most likely viral. Patient needs to follow up with the clinic of ED if symptoms worsen!!!

## 2017-07-02 ENCOUNTER — Other Ambulatory Visit: Payer: Self-pay | Admitting: Family Medicine

## 2017-07-02 DIAGNOSIS — I10 Essential (primary) hypertension: Secondary | ICD-10-CM

## 2017-08-05 ENCOUNTER — Encounter: Payer: Self-pay | Admitting: Nurse Practitioner

## 2017-08-05 ENCOUNTER — Ambulatory Visit: Payer: Self-pay | Attending: Nurse Practitioner | Admitting: Nurse Practitioner

## 2017-08-05 VITALS — BP 136/87 | HR 90 | Temp 99.2°F | Ht 62.0 in | Wt 223.2 lb

## 2017-08-05 DIAGNOSIS — Z6841 Body Mass Index (BMI) 40.0 and over, adult: Secondary | ICD-10-CM | POA: Insufficient documentation

## 2017-08-05 DIAGNOSIS — F419 Anxiety disorder, unspecified: Secondary | ICD-10-CM | POA: Insufficient documentation

## 2017-08-05 DIAGNOSIS — Z811 Family history of alcohol abuse and dependence: Secondary | ICD-10-CM | POA: Insufficient documentation

## 2017-08-05 DIAGNOSIS — Z1231 Encounter for screening mammogram for malignant neoplasm of breast: Secondary | ICD-10-CM

## 2017-08-05 DIAGNOSIS — Z79899 Other long term (current) drug therapy: Secondary | ICD-10-CM | POA: Insufficient documentation

## 2017-08-05 DIAGNOSIS — Z9889 Other specified postprocedural states: Secondary | ICD-10-CM | POA: Insufficient documentation

## 2017-08-05 DIAGNOSIS — Z803 Family history of malignant neoplasm of breast: Secondary | ICD-10-CM | POA: Insufficient documentation

## 2017-08-05 DIAGNOSIS — Z8042 Family history of malignant neoplasm of prostate: Secondary | ICD-10-CM | POA: Insufficient documentation

## 2017-08-05 DIAGNOSIS — K219 Gastro-esophageal reflux disease without esophagitis: Secondary | ICD-10-CM | POA: Insufficient documentation

## 2017-08-05 DIAGNOSIS — Z808 Family history of malignant neoplasm of other organs or systems: Secondary | ICD-10-CM | POA: Insufficient documentation

## 2017-08-05 DIAGNOSIS — Z79891 Long term (current) use of opiate analgesic: Secondary | ICD-10-CM | POA: Insufficient documentation

## 2017-08-05 DIAGNOSIS — R002 Palpitations: Secondary | ICD-10-CM | POA: Insufficient documentation

## 2017-08-05 DIAGNOSIS — Z760 Encounter for issue of repeat prescription: Secondary | ICD-10-CM | POA: Insufficient documentation

## 2017-08-05 DIAGNOSIS — Z7982 Long term (current) use of aspirin: Secondary | ICD-10-CM | POA: Insufficient documentation

## 2017-08-05 DIAGNOSIS — Z8261 Family history of arthritis: Secondary | ICD-10-CM | POA: Insufficient documentation

## 2017-08-05 DIAGNOSIS — Z823 Family history of stroke: Secondary | ICD-10-CM | POA: Insufficient documentation

## 2017-08-05 DIAGNOSIS — Z8249 Family history of ischemic heart disease and other diseases of the circulatory system: Secondary | ICD-10-CM | POA: Insufficient documentation

## 2017-08-05 DIAGNOSIS — Z90710 Acquired absence of both cervix and uterus: Secondary | ICD-10-CM | POA: Insufficient documentation

## 2017-08-05 DIAGNOSIS — I10 Essential (primary) hypertension: Secondary | ICD-10-CM | POA: Insufficient documentation

## 2017-08-05 MED ORDER — LISINOPRIL-HYDROCHLOROTHIAZIDE 10-12.5 MG PO TABS: 1.0000 | ORAL_TABLET | Freq: Every day | ORAL | 1 refills | Status: DC

## 2017-08-05 MED FILL — LISINOPRIL-HCTZ 10-12.5 MG: 10-12.5 | 30 days supply | Qty: 30 | Fill #0

## 2017-08-05 NOTE — Patient Instructions (Signed)

## 2017-08-05 NOTE — Progress Notes (Signed)
Assessment & Plan:  Ann [name] was seen today for establish care and medication refill.  Diagnoses and all orders for this visit:  Essential hypertension -     lisinopril-hydrochlorothiazide (PRINZIDE,ZESTORETIC) 10-12.5 MG tablet; Take 1 tablet by mouth daily. Continue all antihypertensives as prescribed.  Remember to bring in your blood pressure log with you for your follow up appointment.  DASH/Mediterranean Diets are healthier choices for HTN.    Morbid obesity with BMI of 40.0-44.9, adult ([place]) Discussed diet and exercise for person with BMI >25. Instructed: You must burn more calories than you eat. Losing 5 percent of your body weight should be considered a success. In the longer term, losing more than 15 percent of your body weight and staying at this weight is an extremely good result. However, keep in mind that even losing 5 percent of your body weight leads to important health benefits, so try not to get discouraged if you're not able to lose more than this. Will recheck weight in 3-6 months.  Patient has been counseled on age-appropriate routine health concerns for screening and prevention. These are reviewed and up-to-date. Referrals have been placed accordingly. Immunizations are up-to-date or declined.    Subjective:   Chief Complaint  Patient presents with  . Establish Care    Pt is here to establish care for hypertension   . Medication Refill   HPI Ann [name] 57 y.o. female presents to office today to establish care as a new patient.  Essential Hypertension Chronic. Stable. Taking lisinopril-hctz 10-12.5 as prescribed. She does not check her blood pressure at home. Denies chest pain, shortness of breath, palpitations, lightheadedness, dizziness, headaches or BLE edema.  BP Readings from Last 3 Encounters:  08/05/17 136/87  02/23/17 121/78  11/28/16 103/71    Review of Systems  Constitutional: Negative for fever, malaise/fatigue and weight loss.  HENT:  Negative.  Negative for nosebleeds.   Eyes: Negative.  Negative for blurred vision, double vision and photophobia.  Respiratory: Negative.  Negative for cough and shortness of breath.   Cardiovascular: Negative.  Negative for chest pain, palpitations and leg swelling.  Gastrointestinal: Negative.  Negative for heartburn, nausea and vomiting.  Musculoskeletal: Negative.  Negative for myalgias.  Neurological: Negative.  Negative for dizziness, focal weakness, seizures and headaches.  Psychiatric/Behavioral: Negative.  Negative for suicidal ideas.    Past Medical History:  Diagnosis Date  . Anemia   . Anxiety   . Dysrhythmia    palpitations  . GERD (gastroesophageal reflux disease)   . Headache(784.0)   . Hypertension     Past Surgical History:  Procedure Laterality Date  . ABDOMINAL HYSTERECTOMY N/A 03/23/2013   Procedure: HYSTERECTOMY ABDOMINAL;  Surgeon: Lavonia Drafts, MD;  Location: [facility];  Service: Gynecology;  Laterality: N/A;  . BILATERAL SALPINGECTOMY Bilateral 03/23/2013   Procedure: BILATERAL SALPINGECTOMY;  Surgeon: Lavonia Drafts, MD;  Location: [facility];  Service: Gynecology;  Laterality: Bilateral;  . COLPOSCOPY    . FOOT SURGERY     right 5th toe-bone removed n1997    Family History  Problem Relation Age of Onset  . Arthritis Mother   . Stroke Mother   . Hypertension Mother   . Atrial fibrillation Mother   . Arthritis Father   . Cancer Father        prostate  . Stroke Father   . Alcohol abuse Brother   . Stroke Brother   . Cancer Paternal Aunt        breast and uterine  .  Colon cancer Neg Hx     Social History Reviewed with no changes to be made today.   Outpatient Medications Prior to Visit  Medication Sig Dispense Refill  . acetaminophen (TYLENOL) 500 MG tablet Take 1,000 mg by mouth every 6 (six) hours as needed (for headache).    Marland Kitchen aspirin EC 81 MG tablet Take 81 mg by mouth daily.    Marland Kitchen ibuprofen (ADVIL,MOTRIN) 800 MG tablet Take  1 tablet (800 mg total) by mouth every 8 (eight) hours as needed for headache (Take with food.). 30 tablet 0  . Melatonin 5 MG CAPS Take 1 capsule (5 mg total) by mouth at bedtime.  0  . Multiple Vitamin (MULTIVITAMIN WITH MINERALS) TABS tablet Take 1 tablet by mouth daily.    Marland Kitchen lisinopril-hydrochlorothiazide (PRINZIDE,ZESTORETIC) 10-12.5 MG tablet TAKE 1 TABLET BY MOUTH ONCE DAILY 30 tablet 0  . ranitidine (ZANTAC) 150 MG capsule Take 1 capsule (150 mg total) by mouth daily. (Patient not taking: Reported on 08/05/2017) 30 capsule 1  . traMADol (ULTRAM) 50 MG tablet Take 1 tablet (50 mg total) by mouth every 8 (eight) hours as needed. (Patient not taking: Reported on 02/23/2017) 12 tablet 0  . benzocaine (ORAJEL) 10 % mucosal gel Use as directed 1 application in the mouth or throat as needed for mouth pain. (Patient not taking: Reported on 02/23/2017) 5.3 g 0  . capsaicin (ZOSTRIX) 0.025 % cream Apply topically 2 (two) times daily as needed. (Patient not taking: Reported on 02/23/2017) 60 g 0  . methocarbamol (ROBAXIN) 500 MG tablet Take 1 tablet (500 mg total) by mouth 4 (four) times daily. Prn muscle spasm (Patient not taking: Reported on 08/05/2017) 90 tablet 0  . sertraline (ZOLOFT) 25 MG tablet Take 1 tablet (25 mg total) by mouth daily. (Patient not taking: Reported on 04/01/2016) 30 tablet 3  . simethicone (MYLICON) 270 MG chewable tablet Chew 1 tablet (125 mg total) by mouth every 6 (six) hours as needed for flatulence. (Patient not taking: Reported on 02/23/2017)  0  . terbinafine (LAMISIL AT) 1 % cream Apply topically 2 (two) times daily. Apply to affected areas for 14 days. (Patient not taking: Reported on 02/23/2017) 30 g 0  . terbinafine (LAMISIL) 250 MG tablet Take 1 tablet (250 mg total) by mouth daily. (Patient not taking: Reported on 02/23/2017) 14 tablet 0   No facility-administered medications prior to visit.     No Known Allergies     Objective:    BP 136/87 (BP Location: Left  Arm, Patient Position: Sitting, Cuff Size: Large)   Pulse 90   Temp 99.2 F (37.3 C) (Oral)   Ht 5\' 2"  (1.575 m)   Wt 223 lb 3.2 oz (101.2 kg)   LMP 10/03/2012   SpO2 97%   BMI 40.82 kg/m  Wt Readings from Last 3 Encounters:  08/05/17 223 lb 3.2 oz (101.2 kg)  02/23/17 216 lb (98 kg)  11/28/16 213 lb 3.2 oz (96.7 kg)    Physical Exam  Constitutional: She is oriented to person, place, and time. She appears well-developed and well-nourished. She is cooperative.  HENT:  Head: Normocephalic and atraumatic.  Eyes: EOM are normal.  Neck: Normal range of motion.  Cardiovascular: Normal rate, regular rhythm and normal heart sounds. Exam reveals no gallop and no friction rub.  No murmur heard. Pulmonary/Chest: Effort normal and breath sounds normal. No tachypnea. No respiratory distress. She has no decreased breath sounds. She has no wheezes. She has no rhonchi. She has  no rales. She exhibits no tenderness.  Abdominal: Soft. Bowel sounds are normal.  Musculoskeletal: Normal range of motion. She exhibits no edema.  Neurological: She is alert and oriented to person, place, and time. Coordination normal.  Skin: Skin is warm and dry.  Psychiatric: She has a normal mood and affect. Her behavior is normal. Judgment and thought content normal.  Nursing note and vitals reviewed.      Patient has been counseled extensively about nutrition and exercise as well as the importance of adherence with medications and regular follow-up. The patient was given clear instructions to go to ER or return to medical center if symptoms don't improve, worsen or new problems develop. The patient verbalized understanding.   Follow-up: Return in about 2 months (around 10/05/2017) for PAP SMEAR.   Gildardo Pounds, FNP-BC [facility] and [facility], [place]   08/05/2017, 10:01 PM

## 2017-09-03 MED FILL — LISINOPRIL-HCTZ 10-12.5 MG: 10-12.5 | 30 days supply | Qty: 30 | Fill #1

## 2017-10-06 ENCOUNTER — Other Ambulatory Visit: Payer: Self-pay | Admitting: Nurse Practitioner

## 2017-10-06 MED FILL — LISINOPRIL-HCTZ 10-12.5 MG: 10-12.5 | 30 days supply | Qty: 30 | Fill #2

## 2017-10-21 ENCOUNTER — Other Ambulatory Visit: Payer: Self-pay | Admitting: Nurse Practitioner

## 2017-11-02 ENCOUNTER — Encounter: Payer: Medicaid Other | Admitting: Internal Medicine

## 2017-11-02 MED FILL — LISINOPRIL-HCTZ 10-12.5 MG: 10-12.5 | 30 days supply | Qty: 30 | Fill #3

## 2017-11-09 ENCOUNTER — Ambulatory Visit: Payer: Medicaid Other

## 2017-11-13 ENCOUNTER — Other Ambulatory Visit: Payer: Self-pay | Admitting: Nurse Practitioner

## 2017-12-01 MED FILL — LISINOPRIL-HCTZ 10-12.5 MG: 10-12.5 | 30 days supply | Qty: 30 | Fill #4

## 2017-12-31 MED FILL — LISINOPRIL-HCTZ 10-12.5 MG: 10-12.5 | 30 days supply | Qty: 30 | Fill #5

## 2018-01-14 ENCOUNTER — Ambulatory Visit: Payer: 59 | Attending: Nurse Practitioner | Admitting: Physician Assistant

## 2018-01-14 VITALS — BP 113/77 | HR 87 | Temp 99.0°F | Resp 18 | Ht 62.0 in | Wt 225.0 lb

## 2018-01-14 DIAGNOSIS — R519 Headache, unspecified: Secondary | ICD-10-CM

## 2018-01-14 DIAGNOSIS — R51 Headache: Secondary | ICD-10-CM

## 2018-01-14 DIAGNOSIS — I10 Essential (primary) hypertension: Secondary | ICD-10-CM | POA: Diagnosis not present

## 2018-01-14 DIAGNOSIS — Z23 Encounter for immunization: Secondary | ICD-10-CM

## 2018-01-14 DIAGNOSIS — R5383 Other fatigue: Secondary | ICD-10-CM

## 2018-01-14 DIAGNOSIS — Z7982 Long term (current) use of aspirin: Secondary | ICD-10-CM | POA: Insufficient documentation

## 2018-01-14 DIAGNOSIS — M25552 Pain in left hip: Secondary | ICD-10-CM

## 2018-01-14 DIAGNOSIS — R110 Nausea: Secondary | ICD-10-CM | POA: Diagnosis not present

## 2018-01-14 MED ORDER — LISINOPRIL-HYDROCHLOROTHIAZIDE 10-12.5 MG PO TABS: 1.0000 | ORAL_TABLET | Freq: Every day | ORAL | 1 refills | Status: DC

## 2018-01-14 MED ORDER — IBUPROFEN 800 MG PO TABS: 800.0000 mg | ORAL_TABLET | Freq: Three times a day (TID) | ORAL | 0 refills | Status: DC

## 2018-01-14 NOTE — Progress Notes (Signed)
Ann [name], is a 57 y.o. female  ZHG:[id number]  HDQ:[id number]  DOB - 18-Apr-1961  Subjective:  Chief Complaint and HPI: Ann [name] is a 57 y.o. female here today for several symptoms.  For the last several days she has experienced a pain on the L side of her face that extends across to the R side of her head and leaves her with a dull headache at the top of her head.  She has also had some congestion.  In addition, yesterday she felt nauseated for a couple of hours without vomiting or diarrhea.  She denies CP/SOB.  No vision changes.  No OTC meds.  No f/c.  No abdominal pain.  No cough or urinary s/sx.    No FH aneurysm  ROS:   Constitutional:  No f/c, No night sweats, No unexplained weight loss. EENT:  No vision changes, No blurry vision, No hearing changes. No mouth, throat, or ear problems.  Respiratory: No cough, No SOB Cardiac: No CP, no palpitations GI:  No abd pain, No N/V/D. GU: No Urinary s/sx Musculoskeletal: No joint pain Neuro: + headache, no dizziness, no motor weakness.  Skin: No rash Endocrine:  No polydipsia. No polyuria.  Psych: Denies SI/HI  No problems updated.  ALLERGIES: No Known Allergies  PAST MEDICAL HISTORY: Past Medical History:  Diagnosis Date  . Anemia   . Anxiety   . Dysrhythmia    palpitations  . GERD (gastroesophageal reflux disease)   . Headache(784.0)   . Hypertension     MEDICATIONS AT HOME: Prior to Admission medications   Medication Sig Start Date End Date Taking? Authorizing Provider  acetaminophen (TYLENOL) 500 MG tablet Take 1,000 mg by mouth every 6 (six) hours as needed (for headache).   Yes [provider]  aspirin EC 81 MG tablet Take 81 mg by mouth daily.   Yes [provider]  ibuprofen (ADVIL,MOTRIN) 800 MG tablet Take 1 tablet (800 mg total) by mouth 3 (three) times daily. X 5 days then prn pain 01/14/18  Yes McClung, Angela M, PA-C  Multiple Vitamin (MULTIVITAMIN WITH MINERALS) TABS tablet Take 1  tablet by mouth daily.   Yes [provider]  lisinopril-hydrochlorothiazide (PRINZIDE,ZESTORETIC) 10-12.5 MG tablet Take 1 tablet by mouth daily. 01/14/18   Argentina Donovan, PA-C  Melatonin 5 MG CAPS Take 1 capsule (5 mg total) by mouth at bedtime. Patient not taking: Reported on 01/14/2018 08/13/16   Alfonse Spruce, FNP     Objective:  EXAM:   Vitals:   01/14/18 1137  BP: 113/77  Pulse: 87  Resp: 18  Temp: 99 F (37.2 C)  TempSrc: Oral  SpO2: 100%  Weight: 225 lb (102.1 kg)  Height: 5\' 2"  (1.575 m)    General appearance : A&OX3. NAD. Non-toxic-appearing HEENT: Atraumatic and Normocephalic.  PERRLA. EOM intact.  TM full B. Mouth-MMM, post pharynx w/ mild erythema, + PND. Neck: supple, no JVD. No cervical lymphadenopathy. No thyromegaly Chest/Lungs:  Breathing-non-labored, Good air entry bilaterally, breath sounds normal without rales, rhonchi, or wheezing  CVS: S1 S2 regular, no murmurs, gallops, rubs  Extremities: Bilateral Lower Ext shows no edema, both legs are warm to touch with = pulse throughout Neurology:  CN II-XII grossly intact, Non focal.   Psych:  TP linear. J/I WNL. Normal speech. Appropriate eye contact and affect.  Skin:  No Rash  Data Review Lab Results  Component Value Date   HGBA1C 5.6 08/13/2016   HGBA1C 5.3 11/17/2012  Assessment & Plan   1. Acute nonintractable headache, unspecified headache type No red flags on exam Likely patient is experiencing viral syndrome given the vague and diffuse nature of symptoms.  Watch for shingles or other rash.  To ED if worsens/develops neurological s/sx.   - ibuprofen (ADVIL,MOTRIN) 800 MG tablet; Take 1 tablet (800 mg total) by mouth 3 (three) times daily. X 5 days then prn pain  Dispense: 40 tablet; Refill: 0  2. Nausea Likely patient is experiencing viral syndrome given the vague and diffuse nature of symptoms.  Watch for shingles or other rash.  To ED if worsens/develops neurological s/sx.     - CBC with Differential/Platelet  3. Fatigue, unspecified type - Vitamin D, 25-hydroxy - TSH  4. Essential hypertension Controlled-continue current regimen - Comprehensive metabolic panel - lisinopril-hydrochlorothiazide (PRINZIDE,ZESTORETIC) 10-12.5 MG tablet; Take 1 tablet by mouth daily.  Dispense: 90 tablet; Refill: 1 - Lipid panel   5. Need for immunization against influenza - Flu Vaccine QUAD 36+ mos IM  Patient have been counseled extensively about nutrition and exercise  Return for keep CPE scheduled with [facility].  The patient was given clear instructions to go to ER or return to medical center if symptoms don't improve, worsen or new problems develop. The patient verbalized understanding. The patient was told to call to get lab results if they haven't heard anything in the next week.     Freeman Caldron, PA-C [facility] and [facility], [place]   01/14/2018, 1:24 PMPatient ID: Ann [name], female   DOB: 10/06/1960, 57 y.o.   MRN: [id number]

## 2018-01-15 ENCOUNTER — Telehealth: Payer: Self-pay | Admitting: *Deleted

## 2018-01-15 LAB — LIPID PANEL
CHOLESTEROL TOTAL: 172 mg/dL (ref 100–199)
Chol/HDL Ratio: 2.3 ratio (ref 0.0–4.4)
HDL: 75 mg/dL (ref >39)
LDL CALC: 76 mg/dL (ref 0–99)
TRIGLYCERIDES: 105 mg/dL (ref 0–149)
VLDL Cholesterol Cal: 21 mg/dL (ref 5–40)

## 2018-01-15 LAB — CBC WITH DIFFERENTIAL/PLATELET
BASOS ABS: 0 10*3/uL (ref 0.0–0.2)
Basos: 0 %
EOS (ABSOLUTE): 0.1 10*3/uL (ref 0.0–0.4)
Eos: 2 %
HEMOGLOBIN: 14.1 g/dL (ref 11.1–15.9)
Hematocrit: 43 % (ref 34.0–46.6)
IMMATURE GRANS (ABS): 0 10*3/uL (ref 0.0–0.1)
IMMATURE GRANULOCYTES: 0 %
LYMPHS ABS: 1.8 10*3/uL (ref 0.7–3.1)
LYMPHS: 35 %
MCH: 29.2 pg (ref 26.6–33.0)
MCHC: 32.8 g/dL (ref 31.5–35.7)
MCV: 89 fL (ref 79–97)
Monocytes Absolute: 0.5 10*3/uL (ref 0.1–0.9)
Monocytes: 9 %
NEUTROS PCT: 54 %
Neutrophils Absolute: 2.8 10*3/uL (ref 1.4–7.0)
Platelets: 226 10*3/uL (ref 150–450)
RBC: 4.83 x10E6/uL (ref 3.77–5.28)
RDW: 14.4 % (ref 12.3–15.4)
WBC: 5.1 10*3/uL (ref 3.4–10.8)

## 2018-01-15 LAB — COMPREHENSIVE METABOLIC PANEL
ALK PHOS: 66 IU/L (ref 39–117)
ALT: 14 IU/L (ref 0–32)
AST: 19 IU/L (ref 0–40)
Albumin/Globulin Ratio: 1.4 (ref 1.2–2.2)
Albumin: 4.2 g/dL (ref 3.5–5.5)
BILIRUBIN TOTAL: 0.5 mg/dL (ref 0.0–1.2)
BUN/Creatinine Ratio: 13 (ref 9–23)
BUN: 12 mg/dL (ref 6–24)
CALCIUM: 9.7 mg/dL (ref 8.7–10.2)
CHLORIDE: 100 mmol/L (ref 96–106)
CO2: 24 mmol/L (ref 20–29)
CREATININE: 0.93 mg/dL (ref 0.57–1.00)
GFR calc Af Amer: 79 mL/min/{1.73_m2} (ref >59)
GFR calc non Af Amer: 69 mL/min/{1.73_m2} (ref >59)
GLOBULIN, TOTAL: 3.1 g/dL (ref 1.5–4.5)
Glucose: 81 mg/dL (ref 65–99)
Potassium: 3.9 mmol/L (ref 3.5–5.2)
Sodium: 140 mmol/L (ref 134–144)
TOTAL PROTEIN: 7.3 g/dL (ref 6.0–8.5)

## 2018-01-15 LAB — VITAMIN D 25 HYDROXY (VIT D DEFICIENCY, FRACTURES): VIT D 25 HYDROXY: 34.9 ng/mL (ref 30.0–100.0)

## 2018-01-15 LAB — TSH: TSH: 0.514 u[IU]/mL (ref 0.450–4.500)

## 2018-01-15 NOTE — Telephone Encounter (Signed)
-----   Message from Argentina Donovan, [place] sent at 01/15/2018  2:11 PM EDT ----- Your blood work was normal.  This includes:  Blood count/white blood cells and red blood cells, blood sugar, kidney function, liver function, electrolytes, cholesterol, vitamin D, and thyroid.  Follow-up as planned.  Thanks, Freeman Caldron, PA-C

## 2018-01-15 NOTE — Telephone Encounter (Signed)
Voicemail states to give a call back to [place] with [facility] at [phone]. Medical Assistant left message on patient's home and cell voicemail. Please inform patient of blood being normal and to follow up as planned.

## 2018-01-18 ENCOUNTER — Telehealth: Payer: Self-pay | Admitting: Nurse Practitioner

## 2018-01-18 NOTE — Telephone Encounter (Signed)
Patient called, verified DOB, results were given, patient has no questions or concerns.

## 2018-01-22 ENCOUNTER — Ambulatory Visit: Payer: Medicaid Other

## 2018-01-22 ENCOUNTER — Ambulatory Visit
Admission: RE | Admit: 2018-01-22 | Discharge: 2018-01-22 | Disposition: A | Discharge status: Home / Self Care | Payer: 59 | Source: Ambulatory Visit | Attending: Nurse Practitioner | Admitting: Nurse Practitioner

## 2018-01-22 DIAGNOSIS — Z1231 Encounter for screening mammogram for malignant neoplasm of breast: Secondary | ICD-10-CM

## 2018-01-25 ENCOUNTER — Ambulatory Visit: Payer: 59 | Attending: Nurse Practitioner | Admitting: Nurse Practitioner

## 2018-01-25 ENCOUNTER — Encounter: Payer: Self-pay | Admitting: Nurse Practitioner

## 2018-01-25 VITALS — BP 117/79 | HR 90 | Temp 99.1°F | Ht 62.0 in | Wt 228.0 lb

## 2018-01-25 DIAGNOSIS — I10 Essential (primary) hypertension: Secondary | ICD-10-CM | POA: Insufficient documentation

## 2018-01-25 DIAGNOSIS — L309 Dermatitis, unspecified: Secondary | ICD-10-CM | POA: Diagnosis not present

## 2018-01-25 DIAGNOSIS — M79672 Pain in left foot: Secondary | ICD-10-CM | POA: Insufficient documentation

## 2018-01-25 DIAGNOSIS — Z811 Family history of alcohol abuse and dependence: Secondary | ICD-10-CM | POA: Insufficient documentation

## 2018-01-25 DIAGNOSIS — R238 Other skin changes: Secondary | ICD-10-CM | POA: Diagnosis not present

## 2018-01-25 DIAGNOSIS — Z79899 Other long term (current) drug therapy: Secondary | ICD-10-CM | POA: Insufficient documentation

## 2018-01-25 DIAGNOSIS — Z0000 Encounter for general adult medical examination without abnormal findings: Secondary | ICD-10-CM | POA: Diagnosis not present

## 2018-01-25 DIAGNOSIS — Z823 Family history of stroke: Secondary | ICD-10-CM | POA: Insufficient documentation

## 2018-01-25 DIAGNOSIS — Z7982 Long term (current) use of aspirin: Secondary | ICD-10-CM | POA: Insufficient documentation

## 2018-01-25 DIAGNOSIS — Z9889 Other specified postprocedural states: Secondary | ICD-10-CM | POA: Insufficient documentation

## 2018-01-25 DIAGNOSIS — Z8249 Family history of ischemic heart disease and other diseases of the circulatory system: Secondary | ICD-10-CM | POA: Insufficient documentation

## 2018-01-25 DIAGNOSIS — Z8042 Family history of malignant neoplasm of prostate: Secondary | ICD-10-CM | POA: Insufficient documentation

## 2018-01-25 DIAGNOSIS — Z90710 Acquired absence of both cervix and uterus: Secondary | ICD-10-CM | POA: Insufficient documentation

## 2018-01-25 DIAGNOSIS — Z8261 Family history of arthritis: Secondary | ICD-10-CM | POA: Insufficient documentation

## 2018-01-25 MED ORDER — TRIAMCINOLONE ACETONIDE 0.1 % EX OINT: 1.0000 "application " | TOPICAL_OINTMENT | Freq: Two times a day (BID) | CUTANEOUS | 0 refills | Status: DC

## 2018-01-25 MED FILL — TRIAMCINOLONE 0.1% OINTMENT: 0.1 | 30 days supply | Qty: 30 | Fill #0

## 2018-01-25 NOTE — Progress Notes (Signed)
Assessment & Plan:  Ann [name] was seen today for annual exam.  Diagnoses and all orders for this visit:  Well woman exam (no gynecological exam)  Eczema, unspecified type -     triamcinolone ointment (KENALOG) 0.1 %; Apply 1 application topically 2 (two) times daily.  Inflammatory heel pain, left -     DG Foot Complete Left; Future  Other skin changes -     Ambulatory referral to Dermatology    Patient has been counseled on age-appropriate routine health concerns for screening and prevention. These are reviewed and up-to-date. Referrals have been placed accordingly. Immunizations are up-to-date or declined.    Subjective:   Chief Complaint  Patient presents with  . Annual Exam    Pt. is here for a physical.    HPI Ann [name] 57 y.o. female presents to office today for annual wellness exam. She is s/p hysterectomy.   Review of Systems  Constitutional: Negative.  Negative for chills, fever, malaise/fatigue and weight loss.       Hot flashes  HENT: Negative.  Negative for congestion, hearing loss, sinus pain and sore throat.   Eyes: Negative.  Negative for blurred vision, double vision, photophobia and pain.  Respiratory: Negative.  Negative for cough, sputum production, shortness of breath and wheezing.   Cardiovascular: Positive for leg swelling (intermittent right ankle swelling). Negative for chest pain.  Gastrointestinal: Positive for heartburn. Negative for abdominal pain, constipation, diarrhea, nausea and vomiting.  Genitourinary: Negative.   Musculoskeletal: Positive for joint pain (left heel). Negative for myalgias.  Skin: Positive for itching and rash.  Neurological: Negative.  Negative for dizziness, tremors, speech change, focal weakness, seizures and headaches.  Endo/Heme/Allergies: Negative.  Negative for environmental allergies.  Psychiatric/Behavioral: Negative.  Negative for depression and suicidal ideas. The patient is not nervous/anxious and does not  have insomnia.     Past Medical History:  Diagnosis Date  . Anemia   . Anxiety   . Dysrhythmia    palpitations  . GERD (gastroesophageal reflux disease)   . Headache(784.0)   . Hypertension     Past Surgical History:  Procedure Laterality Date  . ABDOMINAL HYSTERECTOMY N/A 03/23/2013   Procedure: HYSTERECTOMY ABDOMINAL;  Surgeon: Lavonia Drafts, MD;  Location: [facility];  Service: Gynecology;  Laterality: N/A;  . BILATERAL SALPINGECTOMY Bilateral 03/23/2013   Procedure: BILATERAL SALPINGECTOMY;  Surgeon: Lavonia Drafts, MD;  Location: [facility];  Service: Gynecology;  Laterality: Bilateral;  . COLPOSCOPY    . FOOT SURGERY     right 5th toe-bone removed n1997    Family History  Problem Relation Age of Onset  . Arthritis Mother   . Stroke Mother   . Hypertension Mother   . Atrial fibrillation Mother   . Arthritis Father   . Cancer Father        prostate  . Stroke Father   . Alcohol abuse Brother   . Stroke Brother   . Cancer Paternal Aunt        breast and uterine  . Colon cancer Neg Hx     Social History Reviewed with no changes to be made today.   Outpatient Medications Prior to Visit  Medication Sig Dispense Refill  . acetaminophen (TYLENOL) 500 MG tablet Take 1,000 mg by mouth every 6 (six) hours as needed (for headache).    Marland Kitchen aspirin EC 81 MG tablet Take 81 mg by mouth daily.    Marland Kitchen ibuprofen (ADVIL,MOTRIN) 800 MG tablet Take 1 tablet (800 mg total) by  mouth 3 (three) times daily. X 5 days then prn pain 40 tablet 0  . lisinopril-hydrochlorothiazide (PRINZIDE,ZESTORETIC) 10-12.5 MG tablet Take 1 tablet by mouth daily. 90 tablet 1  . Multiple Vitamin (MULTIVITAMIN WITH MINERALS) TABS tablet Take 1 tablet by mouth daily.    . Melatonin 5 MG CAPS Take 1 capsule (5 mg total) by mouth at bedtime. (Patient not taking: Reported on 01/14/2018)  0   No facility-administered medications prior to visit.     No Known Allergies     Objective:    BP 117/79 (BP  Location: Left Arm, Patient Position: Sitting, Cuff Size: Large)   Pulse 90   Temp 99.1 F (37.3 C) (Oral)   Ht 5\' 2"  (1.575 m)   Wt 228 lb (103.4 kg)   LMP 10/03/2012   SpO2 98%   BMI 41.70 kg/m  Wt Readings from Last 3 Encounters:  01/25/18 228 lb (103.4 kg)  01/14/18 225 lb (102.1 kg)  08/05/17 223 lb 3.2 oz (101.2 kg)    Physical Exam  Constitutional: She is oriented to person, place, and time. She appears well-developed and well-nourished.  HENT:  Head: Normocephalic and atraumatic.  Right Ear: External ear normal.  Left Ear: External ear normal.  Nose: Nose normal.  Mouth/Throat: Oropharynx is clear and moist. No oropharyngeal exudate.  Eyes: Pupils are equal, round, and reactive to light. Conjunctivae and EOM are normal. Right eye exhibits no discharge. No scleral icterus.  Neck: Normal range of motion. Neck supple. No tracheal deviation present. No thyromegaly present.  Cardiovascular: Normal rate, regular rhythm, normal heart sounds and intact distal pulses. Exam reveals no friction rub.  No murmur heard. Pulses:      Dorsalis pedis pulses are 2+ on the right side, and 2+ on the left side.       Posterior tibial pulses are 2+ on the right side, and 2+ on the left side.  Pulmonary/Chest: Effort normal and breath sounds normal. No accessory muscle usage. No respiratory distress. She has no decreased breath sounds. She has no wheezes. She has no rhonchi. She has no rales. She exhibits no tenderness. Right breast exhibits no inverted nipple, no mass, no nipple discharge, no skin change and no tenderness. Left breast exhibits no inverted nipple, no mass, no nipple discharge, no skin change and no tenderness. Breasts are symmetrical.  Abdominal: Soft. Bowel sounds are normal. She exhibits no distension and no mass. There is no tenderness. There is no rebound and no guarding.  Musculoskeletal: Normal range of motion. She exhibits no edema, tenderness or deformity.       Hands:       Feet:  Lymphadenopathy:    She has no cervical adenopathy.  Neurological: She is alert and oriented to person, place, and time. She has normal strength. No cranial nerve deficit or sensory deficit. She displays a negative Romberg sign. Coordination and gait normal.  Reflex Scores:      Patellar reflexes are 1+ on the right side and 1+ on the left side. Skin: Skin is warm and dry. No erythema.  Psychiatric: She has a normal mood and affect. Her speech is normal and behavior is normal. Judgment and thought content normal.       Patient has been counseled extensively about nutrition and exercise as well as the importance of adherence with medications and regular follow-up. The patient was given clear instructions to go to ER or return to medical center if symptoms don't improve, worsen or new problems develop.  The patient verbalized understanding.   Follow-up: Return in about 6 months (around 07/26/2018) for HTN.   Gildardo Pounds, FNP-BC [facility] and [place], [place]   01/25/2018, 2:23 PM

## 2018-01-25 NOTE — Patient Instructions (Signed)
Heel Pad Atrophy Heel pad atrophy is a cause of heel pain. Your heels take much of the impact when you stand, walk, or run. A fat pad under your heel bone protects it and absorbs shock. The fat pad is made of fat cells that are separated by bands of connecting tissue. Over time, the fat pad can break down and lose elasticity and size (atrophy). This causes heel pain due to decreased cushioning in your heel. In many cases, the pain occurs in both heels. You may feel an aching or burning pain that gets worse while you are walking or standing. This pain may be worse for athletes who play sports on hard surfaces with high impact on the heel. What are the causes? This condition happens when the fat pad under your heel bone breaks down over time. What increases the risk? This condition is more likely to develop in people who:  Are age 91 or older.  Are athletes who play a sport with jumping and landing on hard surfaces, such as basketball.  Are runners, especially if they land on their heels first when they run.  Are overweight.  Have diabetes, rheumatoid arthritis, or blood vessel disease.  Have received steroid injections in the heel area.  Have had trauma to the heel area, such as falling from a height.  What are the signs or symptoms? The most common symptom of this condition is burning or aching pain in one heel or both heels. The pain may be worse when:  Walking or standing, especially on hard surfaces or for long amounts of time.  Walking barefoot.  Wearing hard-soled shoes.  Resting at night.  Pressing on the center of the heel (tenderness).  How is this diagnosed? This condition is diagnosed based on your symptoms, medical history, and a physical exam. Your health care provider will check your heel pad and see if you have tenderness in the center of your heel. You may also have an ultrasound to confirm the diagnosis and measure the thickness of your fat pad. How is this  treated? Treatment for this condition includes:  Reducing your time spent walking, standing, or running.  Avoiding activities that cause pain.  Taking an over-the-counter pain reliever or anti-inflammatory medicine.  Wearing shoes with thick heel cushioning or using a soft shoe insert (orthotic).  Wearing shoes at all times, even indoors.  Taping your heels to increase support.  Losing weight if you are overweight.  Follow these instructions at home: Managing pain, stiffness, and swelling  If directed, apply ice to the injured area. ? Put ice in a plastic bag. ? Place a towel between your heel and the bag. ? Leave the ice on for 20 minutes, 2-3 times a day.  Raise (elevate) the foot above the level of your heart while you are sitting or lying down. Activity  Return to your normal activities as told by your health care provider. Ask your health care provider what activities are safe for you.  Do exercises as told by your health care provider. General instructions  Do not use any tobacco products, such as cigarettes, chewing tobacco, and e-cigarettes. If you need help quitting, ask your health care provider.  Take over-the-counter and prescription medicines only as told by your health care provider.  Keep all follow-up visits as told by your health care provider. This is important. How is this prevented?  Give your body time to rest between periods of activity.  Wear comfortable and supportive shoes during  athletic activity. Contact a health care provider if:  Your symptoms do not improve or they get worse. This information is not intended to replace advice given to you by your health care provider. Make sure you discuss any questions you have with your health care provider. Document Released: 04/28/2005 Document Revised: 01/03/2016 Document Reviewed: 04/10/2015 Elsevier Interactive Patient Education  2018 [place] A heel spur is a bony growth that  forms on the bottom of your heel bone (calcaneus). Heel spurs are common and do not always cause pain. However, heel spurs often cause inflammation in the strong band of tissue that runs underneath the bone of your foot (plantar fascia). When this happens, you may feel pain on the bottom of your foot, near your heel. What are the causes? The cause of heel spurs is not completely understood. They may be caused by pressure on the heel. Or, they may stem from the muscle attachments (tendons) near the spur pulling on the heel. What increases the risk? You may be at risk for a heel spur if you:  Are older than 40.  Are overweight.  Have wear and tear arthritis (osteoarthritis).  Have plantar fascia inflammation.  What are the signs or symptoms? Some people have heel spurs but no symptoms. If you do have symptoms, they may include:  Pain in the bottom of your heel.  Pain that is worse when you first get out of bed.  Pain that gets worse after walking or standing.  How is this diagnosed? Your health care provider may diagnose a heel spur based on your symptoms and a physical exam. You may also have an X-ray of your foot to check for a bony growth coming from the calcaneus. How is this treated? Treatment aims to relieve the pain from the heel spur. This may include:  Stretching exercises.  Losing weight.  Wearing specific shoes, inserts, or orthotics for comfort and support.  Wearing splints at night to properly position your feet.  Taking over-the-counter medicine to relieve pain.  Being treated with high-intensity sound waves to break up the heel spur (extracorporeal shock wave therapy).  Getting steroid injections in your heel to reduce swelling and ease pain.  Having surgery if your heel spur causes long-term (chronic) pain.  Follow these instructions at home:  Take medicines only as directed by your health care provider.  Ask your health care provider if you should use  ice or cold packs on the painful areas of your heel or foot.  Avoid activities that cause you pain until you recover or as directed by your health care provider.  Stretch before exercising or being physically active.  Wear supportive shoes that fit well as directed by your health care provider. You might need to buy new shoes. Wearing old shoes or shoes that do not fit correctly may not provide the support that you need.  Lose weight if your health care provider thinks you should. This can relieve pressure on your foot that may be causing pain and discomfort. Contact a health care provider if:  Your pain continues or gets worse. This information is not intended to replace advice given to you by your health care provider. Make sure you discuss any questions you have with your health care provider. Document Released: 06/04/2005 Document Revised: 10/04/2015 Document Reviewed: 06/29/2013 Elsevier Interactive Patient Education  [employer].

## 2018-02-01 ENCOUNTER — Encounter: Payer: Self-pay | Admitting: Internal Medicine

## 2018-02-01 MED FILL — LISINOPRIL-HCTZ 10-12.5 MG: 10-12.5 | 30 days supply | Qty: 30 | Fill #0

## 2018-03-01 ENCOUNTER — Other Ambulatory Visit: Payer: Self-pay

## 2018-03-01 ENCOUNTER — Ambulatory Visit (AMBULATORY_SURGERY_CENTER): Payer: Self-pay | Admitting: *Deleted

## 2018-03-01 VITALS — Ht 61.0 in | Wt 229.4 lb

## 2018-03-01 DIAGNOSIS — Z86010 Personal history of colonic polyps: Secondary | ICD-10-CM

## 2018-03-01 MED ORDER — SUPREP BOWEL PREP KIT 17.5-3.13-1.6 GM/177ML PO SOLN: 1.0000 | Freq: Once | ORAL | 0 refills | Status: AC

## 2018-03-01 NOTE — Progress Notes (Signed)
Patient denies any allergies to egg or soy products. Patient denies complications with anesthesia/sedation.  Patient denies oxygen use at home and denies diet medications. Pamphlet given on colonoscopy. 

## 2018-03-02 ENCOUNTER — Encounter: Payer: Self-pay | Admitting: Internal Medicine

## 2018-03-02 MED FILL — SUPREP BOWEL PREP KIT: 17.5-3.13-1 | 1 days supply | Qty: 354 | Fill #0

## 2018-03-02 MED FILL — LISINOPRIL-HCTZ 10-12.5 MG: 10-12.5 | 30 days supply | Qty: 30 | Fill #1

## 2018-03-14 ENCOUNTER — Telehealth: Payer: Self-pay | Admitting: Gastroenterology

## 2018-03-14 NOTE — Telephone Encounter (Signed)
Patient called answering service this weekend with a message she wanted to cancel her 8 AM colonoscopy with Dr. Hilarie Fredrickson on 11/4 (tomorrow). I called her back about this, not able to reach her, I left a message.  Mariann Barter

## 2018-03-15 ENCOUNTER — Encounter: Payer: 59 | Admitting: Internal Medicine

## 2018-03-15 NOTE — Telephone Encounter (Signed)
Ok Repeat colonoscopy for polyp surveillance remains recommended Patient has been notified and it will be up to her to reschedule Thanks  JMP

## 2018-04-02 MED FILL — LISINOPRIL-HCTZ 10-12.5 MG: 10-12.5 | 30 days supply | Qty: 30 | Fill #2

## 2018-04-30 MED FILL — LISINOPRIL-HCTZ 10-12.5 MG: 10-12.5 | 30 days supply | Qty: 30 | Fill #3

## 2018-06-01 MED FILL — LISINOPRIL-HCTZ 10-12.5 MG: 10-12.5 | 30 days supply | Qty: 30 | Fill #4

## 2018-06-09 ENCOUNTER — Ambulatory Visit: Payer: 59 | Attending: Family Medicine | Admitting: Physician Assistant

## 2018-06-09 VITALS — BP 128/89 | HR 88 | Temp 98.1°F | Resp 16 | Wt 231.8 lb

## 2018-06-09 DIAGNOSIS — N765 Ulceration of vagina: Secondary | ICD-10-CM | POA: Diagnosis not present

## 2018-06-09 DIAGNOSIS — N644 Mastodynia: Secondary | ICD-10-CM | POA: Diagnosis not present

## 2018-06-09 DIAGNOSIS — Z1211 Encounter for screening for malignant neoplasm of colon: Secondary | ICD-10-CM | POA: Diagnosis not present

## 2018-06-09 MED ORDER — VALACYCLOVIR HCL 1 G PO TABS: 1000.0000 mg | ORAL_TABLET | Freq: Three times a day (TID) | ORAL | 0 refills | Status: DC

## 2018-06-09 MED FILL — valACYclovir HCL 1 GM TABS: 1 | 7 days supply | Qty: 21 | Fill #0

## 2018-06-09 NOTE — Progress Notes (Signed)
Ann [name], is a 58 y.o. female  KCL:[id number]  CBS:[id number]  DOB - 07-Apr-1961  Subjective:  Chief Complaint and HPI: Ann [name] is a 58 y.o. female here today  for L breast pain since yesterday and a tender area in her perineum for several days.  Not sexually active for over 4-5 years.  No fever.  Last MMG 01/2018 and WNL.   ROS:   Constitutional:  No f/c, No night sweats, No unexplained weight loss. EENT:  No vision changes, No blurry vision, No hearing changes. No mouth, throat, or ear problems.  Respiratory: No cough, No SOB Cardiac: No CP, no palpitations GI:  No abd pain, No N/V/D. GU: No Urinary s/sx Musculoskeletal: No joint pain Neuro: No headache, no dizziness, no motor weakness.  Skin: No rash Endocrine:  No polydipsia. No polyuria.  Psych: Denies SI/HI  No problems updated.  ALLERGIES: No Known Allergies  PAST MEDICAL HISTORY: Past Medical History:  Diagnosis Date  . Anemia   . Anxiety   . Dysrhythmia    palpitations  . GERD (gastroesophageal reflux disease)   . Headache(784.0)   . Hypertension   . SVD (spontaneous vaginal delivery)    x 3    MEDICATIONS AT HOME: Prior to Admission medications   Medication Sig Start Date End Date Taking? Authorizing Provider  acetaminophen (TYLENOL) 500 MG tablet Take 1,000 mg by mouth every 6 (six) hours as needed (for headache).    [provider]  aspirin EC 81 MG tablet Take 81 mg by mouth daily.    [provider]  lisinopril-hydrochlorothiazide (PRINZIDE,ZESTORETIC) 10-12.5 MG tablet Take 1 tablet by mouth daily. 01/14/18   Argentina Donovan, PA-C  Multiple Vitamin (MULTIVITAMIN WITH MINERALS) TABS tablet Take 1 tablet by mouth daily.    [provider]  triamcinolone ointment (KENALOG) 0.1 % Apply 1 application topically 2 (two) times daily. 01/25/18   Gildardo Pounds, NP  valACYclovir (VALTREX) 1000 MG tablet Take 1 tablet (1,000 mg total) by mouth 3 (three) times daily. 06/09/18    Argentina Donovan, PA-C     Objective:  EXAM:   Vitals:   06/09/18 1643  BP: 128/89  Pulse: 88  Resp: 16  Temp: 98.1 F (36.7 C)  TempSrc: Oral  SpO2: 100%  Weight: 231 lb 12.8 oz (105.1 kg)    General appearance : A&OX3. NAD. Non-toxic-appearing HEENT: Atraumatic and Normocephalic.  PERRLA. EOM intact.   B breasts examined without any evidence of mass, erythema, cellulitis, induration.  B axilla w/o abscess or LN.   Chest/Lungs:  Breathing-non-labored, Good air entry bilaterally, breath sounds normal without rales, rhonchi, or wheezing  CVS: S1 S2 regular, no murmurs, gallops, rubs  GU:  In perineum, denuded area of skin w/o definite blisters but TTP and erythematous.  HSV culture taken.  Bimanual exam unremarkable Extremities: Bilateral Lower Ext shows no edema, both legs are warm to touch with = pulse throughout Neurology:  CN II-XII grossly intact, Non focal.   Psych:  TP linear. J/I WNL. Normal speech. Appropriate eye contact and affect.  Skin:  No Rash  Data Review Lab Results  Component Value Date   HGBA1C 5.6 08/13/2016   HGBA1C 5.3 11/17/2012     Assessment & Plan   1. Vaginal ulcer - Virus culture - valACYclovir (VALTREX) 1000 MG tablet; Take 1 tablet (1,000 mg total) by mouth 3 (three) times daily.  Dispense: 21 tablet; Refill: 0  2. Breast pain-no obvious cause-only occurring X 1 day.  MMG normal 4 months ago.  Observe and watchful waiting.  3.  Due for screening colonoscopy-refer to GI.     Patient have been counseled extensively about nutrition and exercise  Return in about 3 months (around 09/08/2018) for Geryl Rankins for chronic conditions.  The patient was given clear instructions to go to ER or return to medical center if symptoms don't improve, worsen or new problems develop. The patient verbalized understanding. The patient was told to call to get lab results if they haven't heard anything in the next week.     Freeman Caldron, PA-C [facility] [place], [place]   06/09/2018, 5:01 PM

## 2018-06-16 ENCOUNTER — Encounter: Payer: Self-pay | Admitting: Internal Medicine

## 2018-06-17 ENCOUNTER — Telehealth: Payer: Self-pay

## 2018-06-17 LAB — VIRUS CULTURE

## 2018-06-17 NOTE — Telephone Encounter (Signed)
Contacted pt to go over lab results pt is aware and doesn't have any questions or concerns 

## 2018-06-30 MED FILL — LISINOPRIL-HCTZ 10-12.5 MG: 10-12.5 | 30 days supply | Qty: 30 | Fill #5

## 2018-07-07 ENCOUNTER — Ambulatory Visit (AMBULATORY_SURGERY_CENTER): Payer: Self-pay | Admitting: *Deleted

## 2018-07-07 VITALS — Ht 62.0 in | Wt 234.0 lb

## 2018-07-07 DIAGNOSIS — Z860100 Personal history of colon polyps, unspecified: Secondary | ICD-10-CM

## 2018-07-07 NOTE — Progress Notes (Signed)
Patient denies any allergies to eggs or soy. Patient denies any problems with anesthesia/sedation. Patient denies any oxygen use at home. Patient denies taking any diet/weight loss medications or blood thinners. Patient states she has the [place] at home.

## 2018-07-08 ENCOUNTER — Encounter: Payer: Self-pay | Admitting: Internal Medicine

## 2018-07-19 ENCOUNTER — Encounter: Payer: 59 | Admitting: Internal Medicine

## 2018-07-29 ENCOUNTER — Encounter: Payer: 59 | Admitting: Internal Medicine

## 2018-07-29 ENCOUNTER — Other Ambulatory Visit: Payer: Self-pay | Admitting: Physician Assistant

## 2018-07-29 DIAGNOSIS — I10 Essential (primary) hypertension: Secondary | ICD-10-CM

## 2018-07-30 MED FILL — LISINOPRIL-HCTZ 10-12.5 MG: 10-12.5 | 30 days supply | Qty: 30 | Fill #0

## 2018-08-30 ENCOUNTER — Other Ambulatory Visit: Payer: Self-pay | Admitting: Family Medicine

## 2018-08-30 DIAGNOSIS — I10 Essential (primary) hypertension: Secondary | ICD-10-CM

## 2018-08-30 MED FILL — LISINOPRIL-HCTZ 10-12.5 MG: 10-12.5 | 30 days supply | Qty: 30 | Fill #0

## 2018-08-31 ENCOUNTER — Telehealth: Payer: Self-pay | Admitting: *Deleted

## 2018-08-31 NOTE — Telephone Encounter (Signed)
Lm on VM informing pt that her 4/28 procedure with Dr. Hilarie Fredrickson is cancelled d/t covid 19 restrictions.  Advised her we will call back to reschedule.

## 2018-09-07 ENCOUNTER — Encounter: Payer: 59 | Admitting: Internal Medicine

## 2018-09-15 ENCOUNTER — Telehealth: Payer: Self-pay | Admitting: *Deleted

## 2018-09-15 NOTE — Telephone Encounter (Signed)
LM on VM for pt to call back to reschedule postponed procedure.

## 2018-09-23 NOTE — Telephone Encounter (Signed)
Attempted to reach pt to RS her colon with Dr Hilarie Fredrickson- LM to return call to RS_ Lelan Pons

## 2018-09-24 ENCOUNTER — Other Ambulatory Visit: Payer: Self-pay | Admitting: Family Medicine

## 2018-09-24 DIAGNOSIS — I10 Essential (primary) hypertension: Secondary | ICD-10-CM

## 2018-09-27 MED FILL — LISINOPRIL-HCTZ 10-12.5 MG: 10-12.5 | 30 days supply | Qty: 30 | Fill #0

## 2018-10-28 ENCOUNTER — Other Ambulatory Visit: Payer: Self-pay | Admitting: Family Medicine

## 2018-10-28 DIAGNOSIS — I10 Essential (primary) hypertension: Secondary | ICD-10-CM

## 2018-11-01 ENCOUNTER — Telehealth: Payer: Self-pay | Admitting: Nurse Practitioner

## 2018-11-01 NOTE — Telephone Encounter (Signed)
1) Medication(s) Requested (by name): lisinopril 2) Pharmacy of Choice: [facility] 3) Special Requests:   Approved medications will be sent to the pharmacy, we will reach out if there is an issue.  Requests made after 3pm may not be addressed until the following business day!  If a patient is unsure of the name of the medication(s) please note and ask patient to call back when they are able to provide all info, do not send to responsible party until all information is available!

## 2018-11-01 NOTE — Telephone Encounter (Signed)
This will be addressed by the provider during the patients visit tomorrow 11/02/18

## 2018-11-02 ENCOUNTER — Ambulatory Visit: Payer: 59 | Attending: Nurse Practitioner | Admitting: Nurse Practitioner

## 2018-11-02 ENCOUNTER — Other Ambulatory Visit: Payer: Self-pay

## 2018-11-02 ENCOUNTER — Encounter: Payer: Self-pay | Admitting: Internal Medicine

## 2018-11-02 MED ORDER — LISINOPRIL-HYDROCHLOROTHIAZIDE 10-12.5 MG PO TABS: 1.0000 | ORAL_TABLET | Freq: Every day | ORAL | 2 refills | Status: DC

## 2018-11-02 MED FILL — LISINOPRIL-HCTZ 10-12.5 MG: 10-12.5 | 30 days supply | Qty: 30 | Fill #0

## 2018-11-03 LAB — BASIC METABOLIC PANEL
BUN/Creatinine Ratio: 14 (ref 9–23)
BUN: 12 mg/dL (ref 6–24)
CO2: 24 mmol/L (ref 20–29)
Calcium: 9.8 mg/dL (ref 8.7–10.2)
Chloride: 102 mmol/L (ref 96–106)
Creatinine, Ser: 0.87 mg/dL (ref 0.57–1.00)
GFR calc Af Amer: 86 mL/min/{1.73_m2} (ref >59)
GFR calc non Af Amer: 75 mL/min/{1.73_m2} (ref >59)
Glucose: 90 mg/dL (ref 65–99)
Potassium: 4.4 mmol/L (ref 3.5–5.2)
Sodium: 141 mmol/L (ref 134–144)

## 2018-11-29 MED FILL — LISINOPRIL-HCTZ 10-12.5 MG: 10-12.5 | 30 days supply | Qty: 30 | Fill #1

## 2018-12-28 MED FILL — LISINOPRIL-HCTZ 10-12.5 MG: 10-12.5 | 30 days supply | Qty: 30 | Fill #2

## 2019-01-18 ENCOUNTER — Telehealth: Payer: Self-pay | Admitting: Nurse Practitioner

## 2019-01-18 ENCOUNTER — Ambulatory Visit: Payer: 59 | Attending: Family Medicine | Admitting: Family Medicine

## 2019-01-18 NOTE — Telephone Encounter (Signed)
Patient is returning missed call. Please follow up

## 2019-01-19 NOTE — Telephone Encounter (Signed)
Message has been sent to Admin pool to schedule patient an appointment.

## 2019-01-28 ENCOUNTER — Other Ambulatory Visit: Payer: Self-pay | Admitting: Nurse Practitioner

## 2019-01-28 ENCOUNTER — Telehealth: Payer: Self-pay | Admitting: Nurse Practitioner

## 2019-01-28 DIAGNOSIS — I10 Essential (primary) hypertension: Secondary | ICD-10-CM

## 2019-01-28 MED FILL — LISINOPRIL-HCTZ 10-12.5 MG: 10-12.5 | 30 days supply | Qty: 30 | Fill #0

## 2019-01-28 NOTE — Telephone Encounter (Signed)
Left VM to schedule follow up

## 2019-01-28 NOTE — Telephone Encounter (Signed)
-----   Message from [place] sent at 01/19/2019 10:32 AM EDT ----- Regarding: appointment Patient needs next available appointment with Dr. Margarita Rana. Please schedule and call patient with appointment.

## 2019-02-22 ENCOUNTER — Other Ambulatory Visit: Payer: Self-pay

## 2019-02-22 ENCOUNTER — Ambulatory Visit: Payer: 59 | Attending: Nurse Practitioner | Admitting: Nurse Practitioner

## 2019-02-22 ENCOUNTER — Encounter: Payer: Self-pay | Admitting: Nurse Practitioner

## 2019-02-22 VITALS — BP 116/73 | HR 88 | Temp 98.8°F | Ht 62.0 in | Wt 240.0 lb

## 2019-02-22 DIAGNOSIS — I10 Essential (primary) hypertension: Secondary | ICD-10-CM

## 2019-02-22 DIAGNOSIS — Z1231 Encounter for screening mammogram for malignant neoplasm of breast: Secondary | ICD-10-CM

## 2019-02-22 DIAGNOSIS — M25511 Pain in right shoulder: Secondary | ICD-10-CM

## 2019-02-22 DIAGNOSIS — G8929 Other chronic pain: Secondary | ICD-10-CM

## 2019-02-22 MED ORDER — LISINOPRIL-HYDROCHLOROTHIAZIDE 10-12.5 MG PO TABS: 1.0000 | ORAL_TABLET | Freq: Every day | ORAL | 1 refills | Status: DC

## 2019-02-22 MED ORDER — METHOCARBAMOL 500 MG PO TABS: 500.0000 mg | ORAL_TABLET | Freq: Three times a day (TID) | ORAL | 1 refills | Status: AC | PRN

## 2019-02-22 MED ORDER — MELOXICAM 7.5 MG PO TABS: 7.5000 mg | ORAL_TABLET | Freq: Every day | ORAL | 1 refills | Status: DC

## 2019-02-22 MED FILL — METHOCARBAMOL 500 MG TABS: 500 | 20 days supply | Qty: 60 | Fill #0

## 2019-02-22 MED FILL — LISINOPRIL-HCTZ 10-12.5 MG: 10-12.5 | 30 days supply | Qty: 30 | Fill #0

## 2019-02-22 MED FILL — MELOXICAM 7.5 MG TABLET: 7.5 | 30 days supply | Qty: 30 | Fill #0

## 2019-02-22 NOTE — Progress Notes (Signed)
Assessment & Plan:  Ann [name] was seen today for blood pressure check and referral.  Diagnoses and all orders for this visit:  Chronic right shoulder pain -     meloxicam (MOBIC) 7.5 MG tablet; Take 1 tablet (7.5 mg total) by mouth daily. -     methocarbamol (ROBAXIN) 500 MG tablet; Take 1 tablet (500 mg total) by mouth every 8 (eight) hours as needed for muscle spasms. -     DG Shoulder Right; Future Work on losing weight to help reduce joint pain. May alternate with heat and ice application for pain relief. May also alternate with acetaminophen as prescribed pain relief. Other alternatives include massage, acupuncture and water aerobics.  You must stay active and avoid a sedentary lifestyle.  Essential hypertension -     lisinopril-hydrochlorothiazide (ZESTORETIC) 10-12.5 MG tablet; Take 1 tablet by mouth daily. Needs visit with PCP (Tomesha Sargent) for more refills. Continue all antihypertensives as prescribed.  Remember to bring in your blood pressure log with you for your follow up appointment.  DASH/Mediterranean Diets are healthier choices for HTN.    Breast cancer screening by mammogram -     MM 3D SCREEN BREAST BILATERAL; Future    Patient has been counseled on age-appropriate routine health concerns for screening and prevention. These are reviewed and up-to-date. Referrals have been placed accordingly. Immunizations are up-to-date or declined.    Subjective:   Chief Complaint  Patient presents with   Blood Pressure Check    Pt. is here for blood pressure check   Referral    Pt. would like a referral for right shoulder pain and back pain. Pt. stated her back pain is much better.    HPI Ann [name] 58 y.o. female presents to office today for follow up. She has complaints of right shoulder pain today.    Right shoulder pain  Rates pain 8/10. Dull and aching. 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 She believes  this has affected her shoulder pain. Onset of pain 2 months ago.  She has been taking tylenol for her pain with no relief. Pain is worse with upward and downward motion of her right arm.    Essential Hypertension Well controlled. Taking lisinopril hctz 10-12.5 mg daily as prescribed. Denies chest pain, shortness of breath, palpitations, lightheadedness, dizziness, headaches or BLE edema. She is not diet or exercise compliant.  BP Readings from Last 3 Encounters:  02/22/19 116/73  06/09/18 128/89  01/25/18 117/79    Depression Currently going through counseling through the [employer]  [facility] with her job. Denies any thoughts of self harm.    Review of Systems  Constitutional: Negative for fever, malaise/fatigue and weight loss.  HENT: Negative.  Negative for nosebleeds.   Eyes: Negative.  Negative for blurred vision, double vision and photophobia.  Respiratory: Negative.  Negative for cough and shortness of breath.   Cardiovascular: Negative.  Negative for chest pain, palpitations and leg swelling.  Gastrointestinal: Positive for heartburn. Negative for nausea and vomiting.  Musculoskeletal: Positive for joint pain. Negative for myalgias.       SEE HPI  Neurological: Negative.  Negative for dizziness, focal weakness, seizures and headaches.  Psychiatric/Behavioral: Positive for depression. Negative for suicidal ideas. The patient is nervous/anxious.     Past Medical History:  Diagnosis Date   Anemia    Anxiety    Dysrhythmia    palpitations   GERD (gastroesophageal reflux disease)  Headache(784.0)    Hypertension    SVD (spontaneous vaginal delivery)    x 3    Past Surgical History:  Procedure Laterality Date   ABDOMINAL HYSTERECTOMY N/A 03/23/2013   Procedure: HYSTERECTOMY ABDOMINAL;  Surgeon: Lavonia Drafts, MD;  Location: [facility];  Service: Gynecology;  Laterality: N/A;   BILATERAL SALPINGECTOMY Bilateral 03/23/2013   Procedure: BILATERAL SALPINGECTOMY;   Surgeon: Lavonia Drafts, MD;  Location: [facility];  Service: Gynecology;  Laterality: Bilateral;   COLONOSCOPY  08/2013   Pyrtle - polyp   COLPOSCOPY     FOOT SURGERY     right 5th toe-bone removed n1997   WISDOM TOOTH EXTRACTION      Family History  Problem Relation Age of Onset   Arthritis Mother    Stroke Mother    Hypertension Mother    Atrial fibrillation Mother    Arthritis Father    Cancer Father        prostate   Stroke Father    Alcohol abuse Brother    Stroke Brother    Cancer Paternal Aunt        breast and uterine   Colon cancer Neg Hx    Rectal cancer Neg Hx    Stomach cancer Neg Hx    Colon polyps Neg Hx    Esophageal cancer Neg Hx     Social History Reviewed with no changes to be made today.   Outpatient Medications Prior to Visit  Medication Sig Dispense Refill   acetaminophen (TYLENOL) 500 MG tablet Take 1,000 mg by mouth every 6 (six) hours as needed (for headache).     aspirin EC 81 MG tablet Take 81 mg by mouth daily.     Multiple Vitamin (MULTIVITAMIN WITH MINERALS) TABS tablet Take 1 tablet by mouth daily.     SUPREP BOWEL PREP KIT 17.5-3.13-1.6 GM/177ML SOLN   0   triamcinolone ointment (KENALOG) 0.1 % Apply 1 application topically 2 (two) times daily. 30 g 0   lisinopril-hydrochlorothiazide (ZESTORETIC) 10-12.5 MG tablet TAKE 1 TABLET BY MOUTH DAILY. NEEDS VISIT WITH PCP (Estefano Victory) FOR MORE REFILLS. 30 tablet 2   No facility-administered medications prior to visit.     No Known Allergies     Objective:    BP 116/73 (BP Location: Right Arm, Patient Position: Sitting, Cuff Size: Large)    Pulse 88    Temp 98.8 F (37.1 C) (Oral)    Ht _0  (1.575 m)    Wt 240 lb (108.9 kg)    LMP  (LMP Unknown)    SpO2 99%    BMI 43.90 kg/m  Wt Readings from Last 3 Encounters:  02/22/19 240 lb (108.9 kg)  07/07/18 234 lb (106.1 kg)  06/09/18 231 lb 12.8 oz (105.1 kg)    Physical Exam Vitals signs and nursing note reviewed.    Constitutional:      Appearance: She is well-developed.  HENT:     Head: Normocephalic and atraumatic.  Neck:     Musculoskeletal: Normal range of motion.  Cardiovascular:     Rate and Rhythm: Normal rate and regular rhythm.     Heart sounds: Normal heart sounds. No murmur. No friction rub. No gallop.   Pulmonary:     Effort: Pulmonary effort is normal. No tachypnea or respiratory distress.     Breath sounds: Normal breath sounds. No decreased breath sounds, wheezing, rhonchi or rales.  Chest:     Chest wall: No tenderness.  Abdominal:     General: Bowel  sounds are normal.     Palpations: Abdomen is soft.  Musculoskeletal: Normal range of motion.     Right shoulder: She exhibits tenderness and pain. She exhibits no crepitus.     Comments: Pain elicited with passive adduction of right arm and lateral straight raise.   Skin:    General: Skin is warm and dry.  Neurological:     Mental Status: She is alert and oriented to person, place, and time.     Coordination: Coordination normal.  Psychiatric:        Behavior: Behavior normal. Behavior is cooperative.        Thought Content: Thought content normal.        Judgment: Judgment normal.          Patient has been counseled extensively about nutrition and exercise as well as the importance of adherence with medications and regular follow-up. The patient was given clear instructions to go to ER or return to medical center if symptoms don't improve, worsen or new problems develop. The patient verbalized understanding.   Follow-up: Return for labs and flu shot.   Gildardo Pounds, FNP-BC [facility] and [place], [place]   02/23/2019, 10:22 PM

## 2019-02-23 ENCOUNTER — Encounter: Payer: Self-pay | Admitting: Nurse Practitioner

## 2019-02-28 ENCOUNTER — Other Ambulatory Visit: Payer: Self-pay

## 2019-02-28 DIAGNOSIS — Z20822 Contact with and (suspected) exposure to covid-19: Secondary | ICD-10-CM

## 2019-03-02 LAB — NOVEL CORONAVIRUS, NAA: SARS-CoV-2, NAA: NOT DETECTED

## 2019-03-28 ENCOUNTER — Encounter (HOSPITAL_COMMUNITY): Payer: Self-pay

## 2019-03-28 ENCOUNTER — Other Ambulatory Visit: Payer: Self-pay

## 2019-03-28 ENCOUNTER — Ambulatory Visit (HOSPITAL_COMMUNITY)
Admission: EM | Admit: 2019-03-28 | Discharge: 2019-03-28 | Disposition: A | Discharge status: Home / Self Care | Payer: 59 | Attending: Internal Medicine | Admitting: Internal Medicine

## 2019-03-28 DIAGNOSIS — I10 Essential (primary) hypertension: Secondary | ICD-10-CM | POA: Diagnosis not present

## 2019-03-28 DIAGNOSIS — J069 Acute upper respiratory infection, unspecified: Secondary | ICD-10-CM

## 2019-03-28 DIAGNOSIS — Z20828 Contact with and (suspected) exposure to other viral communicable diseases: Secondary | ICD-10-CM | POA: Diagnosis not present

## 2019-03-28 DIAGNOSIS — R52 Pain, unspecified: Secondary | ICD-10-CM

## 2019-03-28 DIAGNOSIS — J029 Acute pharyngitis, unspecified: Secondary | ICD-10-CM | POA: Diagnosis present

## 2019-03-28 DIAGNOSIS — R6883 Chills (without fever): Secondary | ICD-10-CM

## 2019-03-28 DIAGNOSIS — Z79899 Other long term (current) drug therapy: Secondary | ICD-10-CM | POA: Insufficient documentation

## 2019-03-28 LAB — POCT RAPID STREP A: Streptococcus, Group A Screen (Direct): NEGATIVE

## 2019-03-28 MED ORDER — CETIRIZINE HCL 10 MG PO CAPS: 10.0000 mg | ORAL_CAPSULE | Freq: Every day | ORAL | 0 refills | Status: DC

## 2019-03-28 NOTE — ED Provider Notes (Signed)
[facility]    CSN: [id number] Arrival date & time: 03/28/19  1552      History   Chief Complaint Chief Complaint  Patient presents with  . Chills  . Generalized Body Aches  . Sore Throat    HPI Ann [name] is a 58 y.o. female history of hypertension, GERD, presenting today for evaluation of body aches chills and sore throat.  Symptoms began yesterday.  She denies any known fevers.  She has been trying Tylenol and ibuprofen without relief.  She denies any congestion or cough.  Denies any known exposure to Covid.  Denies any close sick contacts.  Denies chest pain or shortness of breath.  HPI  Past Medical History:  Diagnosis Date  . Anemia   . Anxiety   . Dysrhythmia    palpitations  . GERD (gastroesophageal reflux disease)   . Headache(784.0)   . Hypertension   . SVD (spontaneous vaginal delivery)    x 3    Patient Active Problem List   Diagnosis Date Noted  . Palpitations 02/06/2014  . Anxiety state, unspecified 11/18/2013  . Abdominal pain 08/19/2013  . Atypical chest pain 02/17/2013  . Essential hypertension, benign 12/24/2012  . Neck muscle spasm 08/06/2012  . Shoulder pain 08/06/2012  . Headache(784.0) 07/03/2010  . Chest pain 07/03/2010  . Elevated BP 07/03/2010    Past Surgical History:  Procedure Laterality Date  . ABDOMINAL HYSTERECTOMY N/A 03/23/2013   Procedure: HYSTERECTOMY ABDOMINAL;  Surgeon: Lavonia Drafts, MD;  Location: [facility];  Service: Gynecology;  Laterality: N/A;  . BILATERAL SALPINGECTOMY Bilateral 03/23/2013   Procedure: BILATERAL SALPINGECTOMY;  Surgeon: Lavonia Drafts, MD;  Location: [facility];  Service: Gynecology;  Laterality: Bilateral;  . COLONOSCOPY  08/2013   Pyrtle - polyp  . COLPOSCOPY    . FOOT SURGERY     right 5th toe-bone removed n1997  . WISDOM TOOTH EXTRACTION      OB History    Gravida  4   Para  3   Term  2   Preterm  1   AB  1   Living  2     SAB  0   TAB  1   Ectopic  0   Multiple  0   Live Births               Home Medications    Prior to Admission medications   Medication Sig Start Date End Date Taking? Authorizing Provider  acetaminophen (TYLENOL) 500 MG tablet Take 1,000 mg by mouth every 6 (six) hours as needed (for headache).    [provider]  aspirin EC 81 MG tablet Take 81 mg by mouth daily.    [provider]  Cetirizine HCl 10 MG CAPS Take 1 capsule (10 mg total) by mouth daily for 10 days. 03/28/19 04/07/19  Chrislyn Seedorf C, PA-C  lisinopril-hydrochlorothiazide (ZESTORETIC) 10-12.5 MG tablet Take 1 tablet by mouth daily. Needs visit with PCP (Zelda) for more refills. 02/22/19 05/23/19  Gildardo Pounds, NP  meloxicam (MOBIC) 7.5 MG tablet Take 1 tablet (7.5 mg total) by mouth daily. 02/22/19   Gildardo Pounds, NP  Multiple Vitamin (MULTIVITAMIN WITH MINERALS) TABS tablet Take 1 tablet by mouth daily.    [provider]  SUPREP BOWEL PREP KIT 17.5-3.13-1.6 GM/177ML SOLN  03/02/18   [provider]  triamcinolone ointment (KENALOG) 0.1 % Apply 1 application topically 2 (two) times daily. 01/25/18   Gildardo Pounds, NP  Family History Family History  Problem Relation Age of Onset  . Arthritis Mother   . Stroke Mother   . Hypertension Mother   . Atrial fibrillation Mother   . Arthritis Father   . Cancer Father        prostate  . Stroke Father   . Alcohol abuse Brother   . Stroke Brother   . Cancer Paternal Aunt        breast and uterine  . Colon cancer Neg Hx   . Rectal cancer Neg Hx   . Stomach cancer Neg Hx   . Colon polyps Neg Hx   . Esophageal cancer Neg Hx     Social History Social History   Tobacco Use  . Smoking status: Never Smoker  . Smokeless tobacco: Never Used  Substance Use Topics  . Alcohol use: Yes    Alcohol/week: 7.0 standard drinks    Types: 7 Glasses of wine per week  . Drug use: No     Allergies   Patient has no known allergies.   Review  of Systems Review of Systems  Constitutional: Positive for chills. Negative for activity change, appetite change, fatigue and fever.  HENT: Positive for sore throat. Negative for congestion, ear pain, rhinorrhea, sinus pressure and trouble swallowing.   Eyes: Negative for discharge and redness.  Respiratory: Negative for cough, chest tightness and shortness of breath.   Cardiovascular: Negative for chest pain.  Gastrointestinal: Negative for abdominal pain, diarrhea, nausea and vomiting.  Musculoskeletal: Positive for myalgias.  Skin: Negative for rash.  Neurological: Negative for dizziness, light-headedness and headaches.     Physical Exam Triage Vital Signs ED Triage Vitals  Enc Vitals Group     BP 03/28/19 1729 128/77     Pulse Rate 03/28/19 1729 88     Resp 03/28/19 1729 17     Temp 03/28/19 1729 98.1 F (36.7 C)     Temp Source 03/28/19 1729 Oral     SpO2 03/28/19 1729 100 %     Weight --      Height --      Head Circumference --      Peak Flow --      Pain Score 03/28/19 1730 4     Pain Loc --      Pain Edu? --      Excl. in [place]? --    No data found.  Updated Vital Signs BP 128/77 (BP Location: Left Arm)   Pulse 88   Temp 98.1 F (36.7 C) (Oral)   Resp 17   LMP  (LMP Unknown)   SpO2 100%   Visual Acuity Right Eye Distance:   Left Eye Distance:   Bilateral Distance:    Right Eye Near:   Left Eye Near:    Bilateral Near:     Physical Exam Vitals signs and nursing note reviewed.  Constitutional:      General: She is not in acute distress.    Appearance: She is well-developed.  HENT:     Head: Normocephalic and atraumatic.     Ears:     Comments: Bilateral ears without tenderness to palpation of external auricle, tragus and mastoid, EAC's without erythema or swelling, TM's with good bony landmarks and cone of light. Non erythematous.     Mouth/Throat:     Comments: Oral mucosa pink and moist, no tonsillar enlargement or exudate. Posterior pharynx  patent and nonerythematous, no uvula deviation or swelling. Normal phonation.  Eyes:  Conjunctiva/sclera: Conjunctivae normal.  Neck:     Musculoskeletal: Neck supple.  Cardiovascular:     Rate and Rhythm: Normal rate and regular rhythm.     Heart sounds: No murmur.  Pulmonary:     Effort: Pulmonary effort is normal. No respiratory distress.     Breath sounds: Normal breath sounds.     Comments: Breathing comfortably at rest, CTABL, no wheezing, rales or other adventitious sounds auscultated Abdominal:     Palpations: Abdomen is soft.     Tenderness: There is no abdominal tenderness.  Skin:    General: Skin is warm and dry.  Neurological:     Mental Status: She is alert.      UC Treatments / Results  Labs (all labs ordered are listed, but only abnormal results are displayed) Labs Reviewed  NOVEL CORONAVIRUS, NAA (HOSP ORDER, SEND-OUT TO REF LAB; TAT 18-24 HRS)  CULTURE, GROUP A STREP [facility])  POCT RAPID STREP A    EKG   Radiology No results found.  Procedures Procedures (including critical care time)  Medications Ordered in UC Medications - No data to display  Initial Impression / Assessment and Plan / UC Course  I have reviewed the triage vital signs and the nursing notes.  Pertinent labs & imaging results that were available during my care of the patient were reviewed by me and considered in my medical decision making (see chart for details).     Strep test negative.  Covid swab pending.  Likely viral etiology, recommending symptomatic and supportive care at this time.  Current vital signs stable and exam unremarkable.  Push fluids, initiate on daily cetirizine to help with any drainage contributing to symptoms.  Rest and fluids.Discussed strict return precautions. Patient verbalized understanding and is agreeable with plan.  Final Clinical Impressions(s) / UC Diagnoses   Final diagnoses:  Viral URI  Sore throat     Discharge Instructions     Sore  Throat  Your rapid strep tested Negative today. We will send for a culture and call in about 2 days if results are positive. For now we will treat your sore throat as a virus with symptom management.   Please continue Tylenol or Ibuprofen for fever and pain. May try salt water gargles, cepacol lozenges, throat spray, or OTC cold relief medicine for throat discomfort. If you also have congestion take a daily anti-histamine like Zyrtec, Claritin, and a oral decongestant to help with post nasal drip that may be irritating your throat.   Stay hydrated and drink plenty of fluids to keep your throat coated relieve irritation.    ED Prescriptions    Medication Sig Dispense Auth. Provider   Cetirizine HCl 10 MG CAPS Take 1 capsule (10 mg total) by mouth daily for 10 days. 10 capsule Marya Lowden, [place] C, PA-C     PDMP not reviewed this encounter.   Janith Lima, [place] 03/28/19 2141

## 2019-03-28 NOTE — Discharge Instructions (Signed)

## 2019-03-28 NOTE — ED Triage Notes (Signed)
Pt presents with generalized body aches, chills, and sore throat since yesterday.

## 2019-03-28 NOTE — ED Triage Notes (Signed)
No answer from patient x4 when called from lobby.

## 2019-03-29 MED FILL — LISINOPRIL-HCTZ 10-12.5 MG: 10-12.5 | 30 days supply | Qty: 30 | Fill #1

## 2019-03-30 LAB — NOVEL CORONAVIRUS, NAA (HOSP ORDER, SEND-OUT TO REF LAB; TAT 18-24 HRS): SARS-CoV-2, NAA: NOT DETECTED

## 2019-03-31 LAB — CULTURE, GROUP A STREP (THRC)

## 2019-04-18 ENCOUNTER — Ambulatory Visit: Payer: 59 | Admitting: Nurse Practitioner

## 2019-04-27 MED FILL — LISINOPRIL-HCTZ 10-12.5 MG: 10-12.5 | 30 days supply | Qty: 30 | Fill #2

## 2019-05-02 ENCOUNTER — Encounter: Payer: Self-pay | Admitting: Internal Medicine

## 2019-05-30 MED FILL — LISINOPRIL-HCTZ 10-12.5 MG: 10-12.5 | 30 days supply | Qty: 30 | Fill #3

## 2019-06-08 ENCOUNTER — Ambulatory Visit: Payer: 59 | Attending: Internal Medicine

## 2019-06-08 DIAGNOSIS — Z20822 Contact with and (suspected) exposure to covid-19: Secondary | ICD-10-CM

## 2019-06-09 ENCOUNTER — Encounter: Payer: 59 | Admitting: Internal Medicine

## 2019-06-09 LAB — NOVEL CORONAVIRUS, NAA: SARS-CoV-2, NAA: NOT DETECTED

## 2019-06-30 MED FILL — LISINOPRIL-HCTZ 10-12.5 MG: 10-12.5 | 30 days supply | Qty: 30 | Fill #4

## 2019-08-01 MED FILL — LISINOPRIL-HCTZ 10-12.5 MG: 10-12.5 | 30 days supply | Qty: 30 | Fill #5

## 2019-08-22 IMAGING — MG DIGITAL SCREENING BILATERAL MAMMOGRAM WITH CAD
4 series · 4 of 4 positions shown · non-contrast
Comparison: Previous exam(s).

CLINICAL DATA: Screening.

EXAM:
DIGITAL SCREENING BILATERAL MAMMOGRAM WITH CAD

[L MLO]
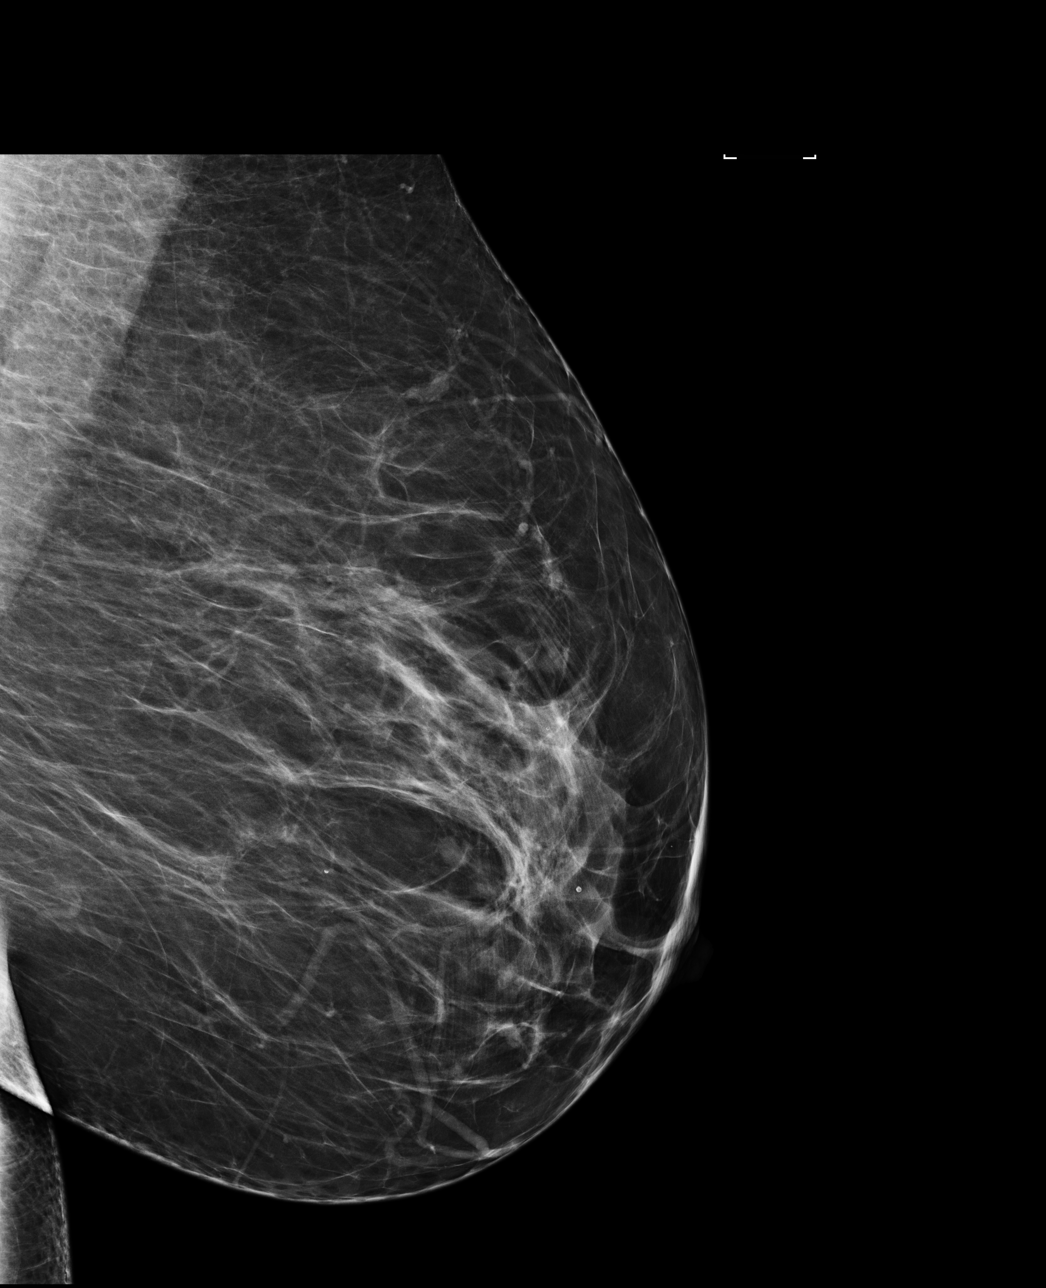

[R MLO]
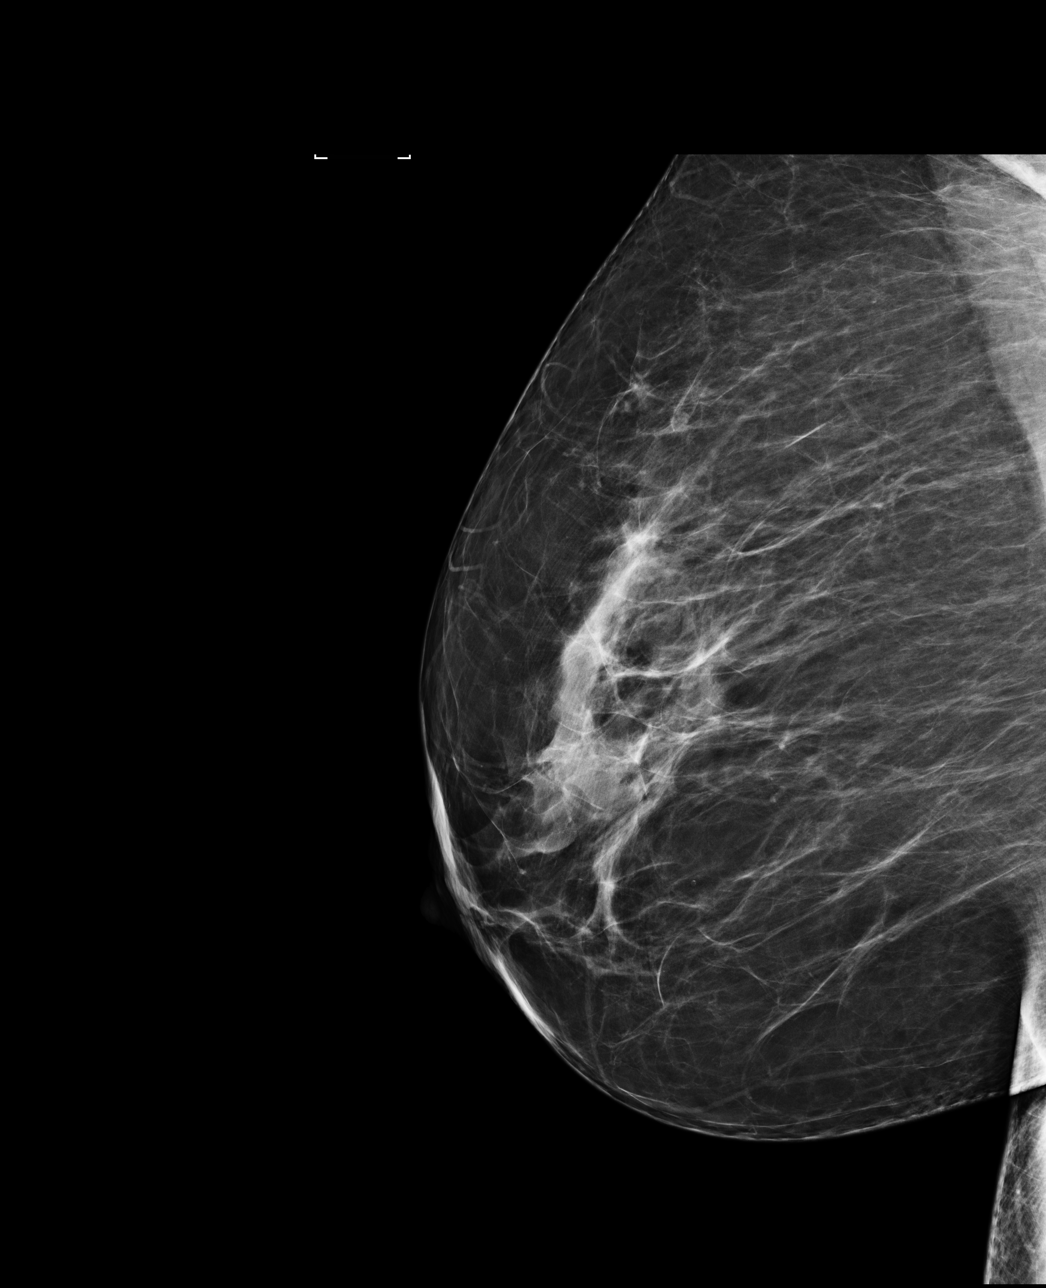

[R CC]
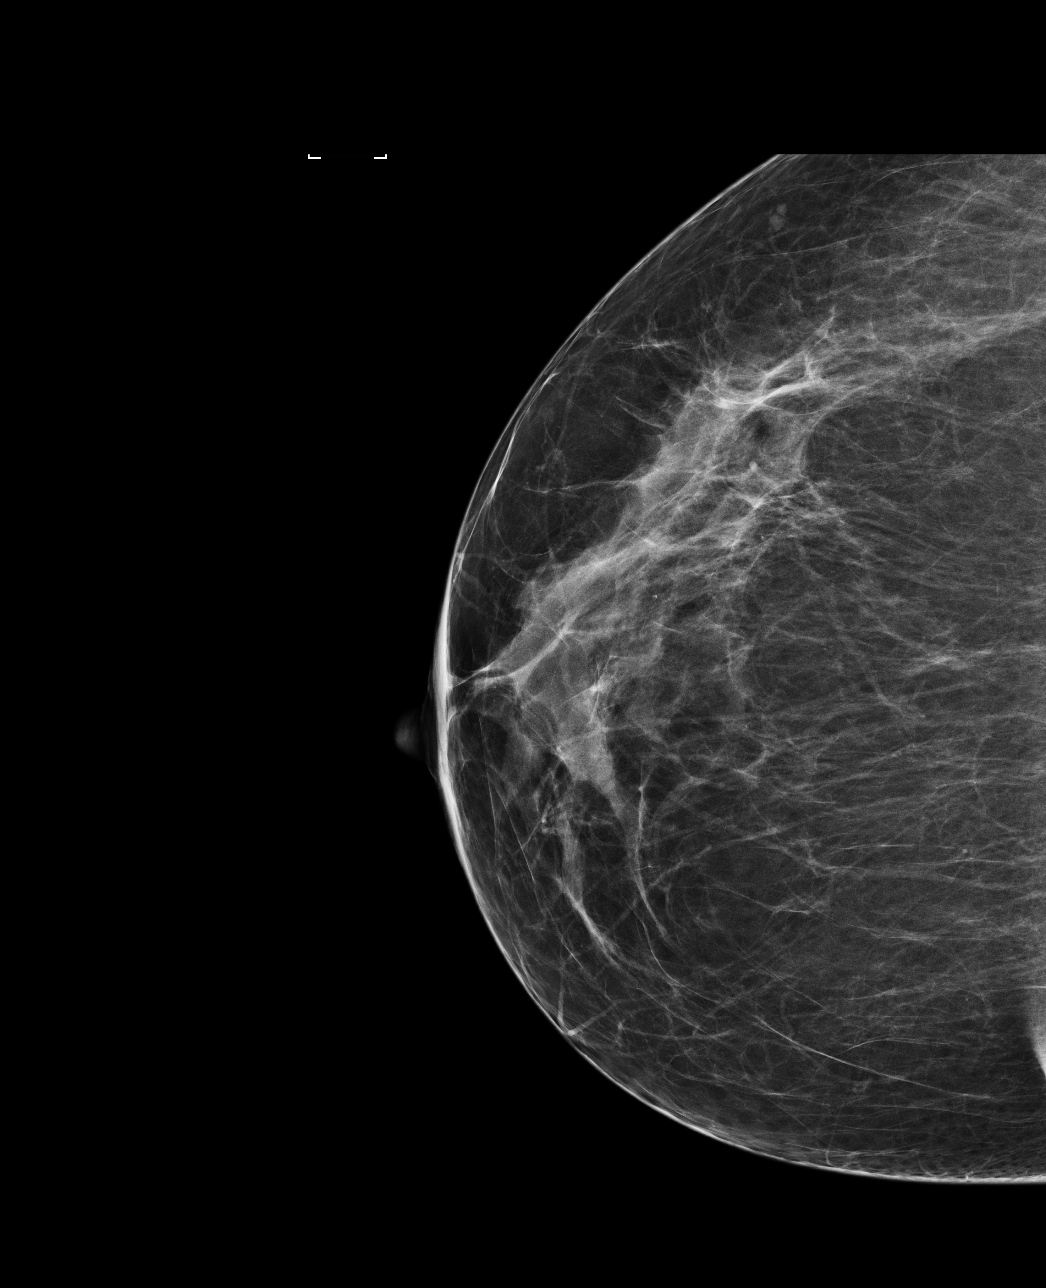

[L CC]
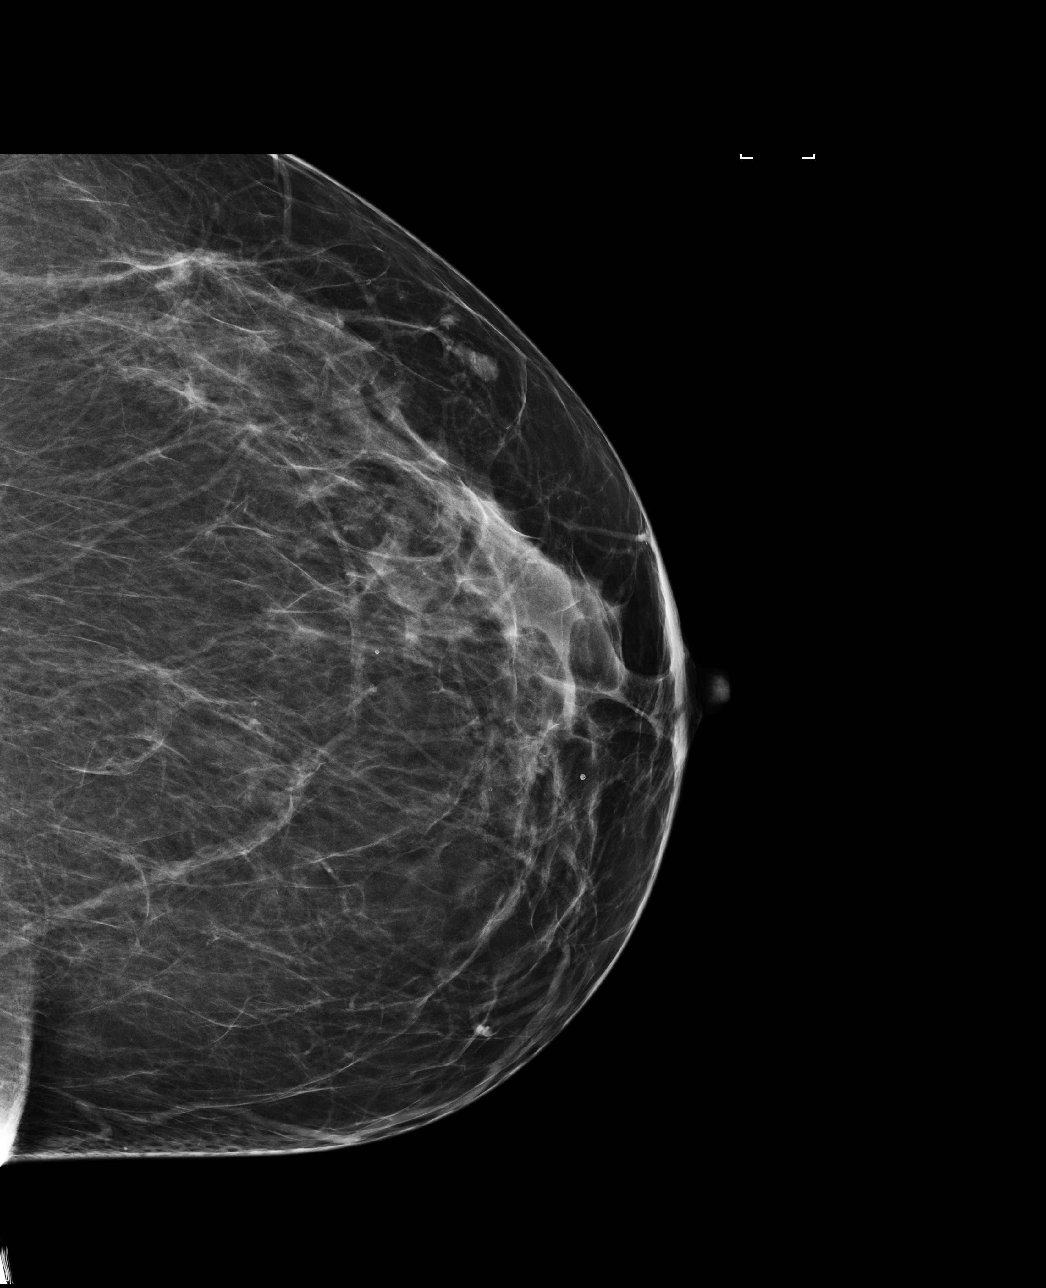

[4 of 4 positions shown; findings below may reference images not displayed]

ACR Breast Density Category b: There are scattered areas of
fibroglandular density.
FINDINGS: There are no findings suspicious for malignancy. Images were
processed with CAD.
IMPRESSION: No mammographic evidence of malignancy. A result letter of this
screening mammogram will be mailed directly to the patient.

RECOMMENDATION:
Screening mammogram in one year. (Code:AS-G-LCT)

BI-RADS CATEGORY  1: Negative.

## 2019-08-29 MED FILL — LISINOPRIL-HCTZ 10-12.5 MG: 10-12.5 | 30 days supply | Qty: 30 | Fill #1

## 2019-09-28 MED FILL — LISINOPRIL-HCTZ 10-12.5 MG: 10-12.5 | 30 days supply | Qty: 30 | Fill #2

## 2019-10-21 ENCOUNTER — Other Ambulatory Visit: Payer: Self-pay

## 2019-10-21 ENCOUNTER — Encounter: Payer: Self-pay | Admitting: Nurse Practitioner

## 2019-10-21 ENCOUNTER — Ambulatory Visit: Payer: 59 | Attending: Nurse Practitioner | Admitting: Nurse Practitioner

## 2019-10-21 ENCOUNTER — Other Ambulatory Visit: Payer: Self-pay | Admitting: Nurse Practitioner

## 2019-10-21 VITALS — BP 133/82 | HR 78 | Temp 97.3°F | Resp 16 | Wt 240.8 lb

## 2019-10-21 DIAGNOSIS — E785 Hyperlipidemia, unspecified: Secondary | ICD-10-CM

## 2019-10-21 DIAGNOSIS — Z130 Encounter for screening for diseases of the blood and blood-forming organs and certain disorders involving the immune mechanism: Secondary | ICD-10-CM | POA: Diagnosis not present

## 2019-10-21 DIAGNOSIS — I10 Essential (primary) hypertension: Secondary | ICD-10-CM

## 2019-10-21 DIAGNOSIS — Z131 Encounter for screening for diabetes mellitus: Secondary | ICD-10-CM

## 2019-10-21 DIAGNOSIS — Z1231 Encounter for screening mammogram for malignant neoplasm of breast: Secondary | ICD-10-CM

## 2019-10-21 DIAGNOSIS — Z1211 Encounter for screening for malignant neoplasm of colon: Secondary | ICD-10-CM

## 2019-10-21 MED ORDER — LISINOPRIL-HYDROCHLOROTHIAZIDE 10-12.5 MG PO TABS: 1.0000 | ORAL_TABLET | Freq: Every day | ORAL | 1 refills | Status: DC

## 2019-10-21 NOTE — Progress Notes (Signed)
Assessment & Plan:  Ann [name] was seen today for hypertension.  Diagnoses and all orders for this visit:  Essential hypertension -     CMP14+EGFR -     lisinopril-hydrochlorothiazide (ZESTORETIC) 10-12.5 MG tablet; Take 1 tablet by mouth daily. Needs visit with PCP (Elexia Friedt) for more refills. Continue all antihypertensives as prescribed.  Remember to bring in your blood pressure log with you for your follow up appointment.  DASH/Mediterranean Diets are healthier choices for HTN.   Encounter for screening for diabetes mellitus -     Hemoglobin A1c  Screening for deficiency anemia -     CBC  Dyslipidemia, goal LDL below 70 -     Lipid panel  Colon cancer screening -     Ambulatory referral to Gastroenterology  Breast cancer screening by mammogram -     MM 3D SCREEN BREAST BILATERAL; Future    Patient has been counseled on age-appropriate routine health concerns for screening and prevention. These are reviewed and up-to-date. Referrals have been placed accordingly. Immunizations are up-to-date or declined.    Subjective:   Chief Complaint  Patient presents with  . Hypertension   HPI Ann [name] 59 y.o. female presents to office today for follow up HTN. She received her Pfizer vaccine however unsure of dates.  She will upload her card or either the dates via her 85.  Patient has been counseled on age-appropriate routine health concerns for screening and prevention. These are reviewed and up-to-date. Referrals have been placed accordingly. Immunizations are up-to-date or declined.     Mammogram has been referred to the breast center Gastroenterology has been referred for follow-up colonoscopy  Essential Hypertension Well-controlled on lisinopril-hydrochlorothiazide 10-12.5 mg daily. Denies chest pain, shortness of breath, palpitations, lightheadedness, dizziness, headaches or BLE edema.  BP Readings from Last 3 Encounters:  10/21/19 133/82  03/28/19 128/77   02/22/19 116/73    Review of Systems  Constitutional: Negative for fever, malaise/fatigue and weight loss.  HENT: Negative.  Negative for nosebleeds.   Eyes: Negative.  Negative for blurred vision, double vision and photophobia.  Respiratory: Negative.  Negative for cough and shortness of breath.   Cardiovascular: Negative.  Negative for chest pain, palpitations and leg swelling.  Gastrointestinal: Negative.  Negative for heartburn, nausea and vomiting.  Musculoskeletal: Negative.  Negative for myalgias.  Neurological: Negative.  Negative for dizziness, focal weakness, seizures and headaches.  Psychiatric/Behavioral: Negative.  Negative for suicidal ideas.    Past Medical History:  Diagnosis Date  . Anemia   . Anxiety   . Dysrhythmia    palpitations  . GERD (gastroesophageal reflux disease)   . Headache(784.0)   . Hypertension   . SVD (spontaneous vaginal delivery)    x 3    Past Surgical History:  Procedure Laterality Date  . ABDOMINAL HYSTERECTOMY N/A 03/23/2013   Procedure: HYSTERECTOMY ABDOMINAL;  Surgeon: Lavonia Drafts, MD;  Location: [facility];  Service: Gynecology;  Laterality: N/A;  . BILATERAL SALPINGECTOMY Bilateral 03/23/2013   Procedure: BILATERAL SALPINGECTOMY;  Surgeon: Lavonia Drafts, MD;  Location: [facility];  Service: Gynecology;  Laterality: Bilateral;  . COLONOSCOPY  08/2013   Pyrtle - polyp  . COLPOSCOPY    . FOOT SURGERY     right 5th toe-bone removed n1997  . WISDOM TOOTH EXTRACTION      Family History  Problem Relation Age of Onset  . Arthritis Mother   . Stroke Mother   . Hypertension Mother   . Atrial fibrillation Mother   . Arthritis  Father   . Cancer Father        prostate  . Stroke Father   . Alcohol abuse Brother   . Stroke Brother   . Cancer Paternal Aunt        breast and uterine  . Colon cancer Neg Hx   . Rectal cancer Neg Hx   . Stomach cancer Neg Hx   . Colon polyps Neg Hx   . Esophageal cancer Neg Hx      Social History Reviewed with no changes to be made today.   Outpatient Medications Prior to Visit  Medication Sig Dispense Refill  . aspirin EC 81 MG tablet Take 81 mg by mouth daily.    . Multiple Vitamin (MULTIVITAMIN WITH MINERALS) TABS tablet Take 1 tablet by mouth daily.     Marland Kitchen acetaminophen (TYLENOL) 500 MG tablet Take 1,000 mg by mouth every 6 (six) hours as needed (for headache). (Patient not taking: Reported on 10/21/2019)    . Cetirizine HCl 10 MG CAPS Take 1 capsule (10 mg total) by mouth daily for 10 days. 10 capsule 0  . lisinopril-hydrochlorothiazide (ZESTORETIC) 10-12.5 MG tablet Take 1 tablet by mouth daily. Needs visit with PCP (Flower Franko) for more refills. 90 tablet 1  . meloxicam (MOBIC) 7.5 MG tablet Take 1 tablet (7.5 mg total) by mouth daily. (Patient not taking: Reported on 10/21/2019) 30 tablet 1  . SUPREP BOWEL PREP KIT 17.5-3.13-1.6 GM/177ML SOLN  (Patient not taking: Reported on 10/21/2019)  0  . triamcinolone ointment (KENALOG) 0.1 % Apply 1 application topically 2 (two) times daily. (Patient not taking: Reported on 10/21/2019) 30 g 0   No facility-administered medications prior to visit.    No Known Allergies     Objective:    BP 133/82   Pulse 78   Temp (!) 97.3 F (36.3 C)   Resp 16   Wt 240 lb 12.8 oz (109.2 kg)   LMP  (LMP Unknown)   SpO2 100%   BMI 44.04 kg/m  Wt Readings from Last 3 Encounters:  10/21/19 240 lb 12.8 oz (109.2 kg)  02/22/19 240 lb (108.9 kg)  07/07/18 234 lb (106.1 kg)    Physical Exam Vitals and nursing note reviewed.  Constitutional:      Appearance: She is well-developed.  HENT:     Head: Normocephalic and atraumatic.  Cardiovascular:     Rate and Rhythm: Normal rate and regular rhythm.     Heart sounds: Normal heart sounds. No murmur heard.  No friction rub. No gallop.   Pulmonary:     Effort: Pulmonary effort is normal. No tachypnea or respiratory distress.     Breath sounds: Normal breath sounds. No decreased  breath sounds, wheezing, rhonchi or rales.  Chest:     Chest wall: No tenderness.  Abdominal:     General: Bowel sounds are normal.     Palpations: Abdomen is soft.  Musculoskeletal:        General: Normal range of motion.     Cervical back: Normal range of motion.  Skin:    General: Skin is warm and dry.  Neurological:     Mental Status: She is alert and oriented to person, place, and time.     Coordination: Coordination normal.  Psychiatric:        Behavior: Behavior normal. Behavior is cooperative.        Thought Content: Thought content normal.        Judgment: Judgment normal.  Patient has been counseled extensively about nutrition and exercise as well as the importance of adherence with medications and regular follow-up. The patient was given clear instructions to go to ER or return to medical center if symptoms don't improve, worsen or new problems develop. The patient verbalized understanding.   Follow-up: Return in about 6 months (around 04/21/2020) for HTN/HPL.   Gildardo Pounds, FNP-BC [facility] and [place], [place]   10/21/2019, 4:36 PM

## 2019-10-22 LAB — CBC
Hematocrit: 43.5 % (ref 34.0–46.6)
Hemoglobin: 14.4 g/dL (ref 11.1–15.9)
MCH: 29.2 pg (ref 26.6–33.0)
MCHC: 33.1 g/dL (ref 31.5–35.7)
MCV: 88 fL (ref 79–97)
Platelets: 221 10*3/uL (ref 150–450)
RBC: 4.93 x10E6/uL (ref 3.77–5.28)
RDW: 14.1 % (ref 11.7–15.4)
WBC: 5.5 10*3/uL (ref 3.4–10.8)

## 2019-10-22 LAB — LIPID PANEL
Chol/HDL Ratio: 2.6 ratio (ref 0.0–4.4)
Cholesterol, Total: 214 mg/dL — ABNORMAL HIGH (ref 100–199)
HDL: 82 mg/dL (ref >39)
LDL Chol Calc (NIH): 117 mg/dL — ABNORMAL HIGH (ref 0–99)
Triglycerides: 86 mg/dL (ref 0–149)
VLDL Cholesterol Cal: 15 mg/dL (ref 5–40)

## 2019-10-22 LAB — CMP14+EGFR
ALT: 13 IU/L (ref 0–32)
AST: 19 IU/L (ref 0–40)
Albumin/Globulin Ratio: 1.4 (ref 1.2–2.2)
Albumin: 4.4 g/dL (ref 3.8–4.9)
Alkaline Phosphatase: 82 IU/L (ref 48–121)
BUN/Creatinine Ratio: 18 (ref 9–23)
BUN: 17 mg/dL (ref 6–24)
Bilirubin Total: 0.3 mg/dL (ref 0.0–1.2)
CO2: 24 mmol/L (ref 20–29)
Calcium: 9.6 mg/dL (ref 8.7–10.2)
Chloride: 103 mmol/L (ref 96–106)
Creatinine, Ser: 0.97 mg/dL (ref 0.57–1.00)
GFR calc Af Amer: 75 mL/min/{1.73_m2} (ref >59)
GFR calc non Af Amer: 65 mL/min/{1.73_m2} (ref >59)
Globulin, Total: 3.2 g/dL (ref 1.5–4.5)
Glucose: 100 mg/dL — ABNORMAL HIGH (ref 65–99)
Potassium: 3.8 mmol/L (ref 3.5–5.2)
Sodium: 141 mmol/L (ref 134–144)
Total Protein: 7.6 g/dL (ref 6.0–8.5)

## 2019-10-22 LAB — HEMOGLOBIN A1C
Est. average glucose Bld gHb Est-mCnc: 111 mg/dL
Hgb A1c MFr Bld: 5.5 % (ref 4.8–5.6)

## 2019-10-24 ENCOUNTER — Encounter: Payer: Self-pay | Admitting: Internal Medicine

## 2019-10-27 MED FILL — LISINOPRIL-HCTZ 10-12.5 MG: 10-12.5 | 30 days supply | Qty: 30 | Fill #0

## 2019-11-23 ENCOUNTER — Ambulatory Visit
Admission: RE | Admit: 2019-11-23 | Discharge: 2019-11-23 | Disposition: A | Discharge status: Home / Self Care | Payer: 59 | Source: Ambulatory Visit | Attending: Nurse Practitioner | Admitting: Nurse Practitioner

## 2019-11-23 ENCOUNTER — Other Ambulatory Visit: Payer: Self-pay

## 2019-11-23 DIAGNOSIS — Z1231 Encounter for screening mammogram for malignant neoplasm of breast: Secondary | ICD-10-CM

## 2019-11-28 MED FILL — LISINOPRIL-HCTZ 10-12.5 MG: 10-12.5 | 30 days supply | Qty: 30 | Fill #1

## 2019-12-14 ENCOUNTER — Ambulatory Visit (AMBULATORY_SURGERY_CENTER): Payer: Self-pay

## 2019-12-14 ENCOUNTER — Encounter: Payer: Self-pay | Admitting: Internal Medicine

## 2019-12-14 ENCOUNTER — Other Ambulatory Visit: Payer: Self-pay

## 2019-12-14 VITALS — Ht 62.0 in | Wt 237.0 lb

## 2019-12-14 DIAGNOSIS — Z86010 Personal history of colonic polyps: Secondary | ICD-10-CM

## 2019-12-14 MED ORDER — SUTAB 1479-225-188 MG PO TABS: 1.0000 | ORAL_TABLET | ORAL | 0 refills | Status: DC

## 2019-12-14 MED FILL — SUTAB 1479-225-188 MG TABS: 1479-225-18 | 24 days supply | Qty: 24 | Fill #0

## 2019-12-14 NOTE — Progress Notes (Signed)
No egg or soy allergy known to patient  No issues with past sedation with any surgeries or procedures No intubation problems in the past  No FH of Malignant Hyperthermia No diet pills per patient No home 02 use per patient  No blood thinners per patient  Pt denies issues with constipation  No A fib or A flutter  EMMI video via MyChart  COVID 19 guidelines implemented in [facility] today with Pt and RN  Coupon given to pt in [facility] today , Code to Pharmacy  COVID vaccines completed on 06/2019 per pt;  Due to the COVID-19 pandemic we are asking patients to follow these guidelines. Please only bring one care partner. Please be aware that your care partner may wait in the car in the parking lot or if they feel like they will be too hot to wait in the car, they may wait in the lobby on the 4th floor. All care partners are required to wear a mask the entire time (we do not have any that we can provide them), they need to practice social distancing, and we will do a Covid check for all patient's and care partners when you arrive. Also we will check their temperature and your temperature. If the care partner waits in their car they need to stay in the parking lot the entire time and we will call them on their cell phone when the patient is ready for discharge so they can bring the car to the front of the building. Also all patient's will need to wear a mask into building.  

## 2019-12-26 MED FILL — LISINOPRIL-HCTZ 10-12.5 MG: 10-12.5 | 30 days supply | Qty: 30 | Fill #2

## 2019-12-28 ENCOUNTER — Encounter: Payer: 59 | Admitting: Internal Medicine

## 2020-01-25 MED FILL — LISINOPRIL-HCTZ 10-12.5 MG: 10-12.5 | 30 days supply | Qty: 30 | Fill #3

## 2020-01-30 ENCOUNTER — Ambulatory Visit: Payer: 59 | Admitting: Nurse Practitioner

## 2020-02-09 ENCOUNTER — Encounter: Payer: 59 | Admitting: Internal Medicine

## 2020-02-23 MED FILL — LISINOPRIL-HCTZ 10-12.5 MG: 10-12.5 | 30 days supply | Qty: 30 | Fill #4

## 2020-02-29 ENCOUNTER — Ambulatory Visit: Payer: 59 | Admitting: Nurse Practitioner

## 2020-03-01 ENCOUNTER — Encounter: Payer: 59 | Admitting: Internal Medicine

## 2020-03-07 ENCOUNTER — Ambulatory Visit (AMBULATORY_SURGERY_CENTER): Payer: Self-pay

## 2020-03-07 ENCOUNTER — Other Ambulatory Visit: Payer: Self-pay

## 2020-03-07 VITALS — Ht 62.0 in | Wt 232.0 lb

## 2020-03-07 DIAGNOSIS — Z86010 Personal history of colonic polyps: Secondary | ICD-10-CM

## 2020-03-07 MED ORDER — NA SULFATE-K SULFATE-MG SULF 17.5-3.13-1.6 GM/177ML PO SOLN: 1.0000 | Freq: Once | ORAL | 0 refills | Status: AC

## 2020-03-07 MED FILL — SUPREP BOWEL PREP KIT: 17.5-3.13-1 | 1 days supply | Qty: 354 | Fill #0

## 2020-03-07 NOTE — Progress Notes (Signed)
No egg or soy allergy known to patient  No issues with past sedation with any surgeries or procedures No intubation problems in the past  No FH of Malignant Hyperthermia No diet pills per patient No home 02 use per patient  No blood thinners per patient  Pt denies issues with constipation  No A fib or A flutter  EMMI video to pt or via [place] 19 guidelines implemented in [place] today with Pt and RN  Coupon given to pt in [facility] today , Code to Pharmacy  COVID vaccines completed on 06/2019 per pt;  Due to the COVID-19 pandemic we are asking patients to follow these guidelines. Please only bring one care partner. Please be aware that your care partner may wait in the car in the parking lot or if they feel like they will be too hot to wait in the car, they may wait in the lobby on the 4th floor. All care partners are required to wear a mask the entire time (we do not have any that we can provide them), they need to practice social distancing, and we will do a Covid check for all patient's and care partners when you arrive. Also we will check their temperature and your temperature. If the care partner waits in their car they need to stay in the parking lot the entire time and we will call them on their cell phone when the patient is ready for discharge so they can bring the car to the front of the building. Also all patient's will need to wear a mask into building.

## 2020-03-20 MED FILL — SUPREP BOWEL PREP KIT: 17.5-3.13-1 | 1 days supply | Qty: 354 | Fill #0

## 2020-03-26 ENCOUNTER — Encounter: Payer: Self-pay | Admitting: Internal Medicine

## 2020-03-26 ENCOUNTER — Ambulatory Visit (AMBULATORY_SURGERY_CENTER): Payer: 59 | Admitting: Internal Medicine

## 2020-03-26 ENCOUNTER — Other Ambulatory Visit: Payer: Self-pay

## 2020-03-26 VITALS — BP 118/78 | HR 68 | Temp 97.3°F | Resp 21 | Ht 62.0 in | Wt 232.0 lb

## 2020-03-26 DIAGNOSIS — K635 Polyp of colon: Secondary | ICD-10-CM | POA: Diagnosis not present

## 2020-03-26 DIAGNOSIS — Z86010 Personal history of colonic polyps: Secondary | ICD-10-CM

## 2020-03-26 DIAGNOSIS — K621 Rectal polyp: Secondary | ICD-10-CM

## 2020-03-26 DIAGNOSIS — D123 Benign neoplasm of transverse colon: Secondary | ICD-10-CM

## 2020-03-26 DIAGNOSIS — D128 Benign neoplasm of rectum: Secondary | ICD-10-CM

## 2020-03-26 DIAGNOSIS — D122 Benign neoplasm of ascending colon: Secondary | ICD-10-CM

## 2020-03-26 MED ORDER — SODIUM CHLORIDE 0.9 % IV SOLN: 500.0000 mL | INTRAVENOUS | Status: DC

## 2020-03-26 NOTE — Patient Instructions (Signed)
Information on polyps given to you. ? ?Await pathology results. ? ?Resume previous diet and medications. ? ? ?YOU HAD AN ENDOSCOPIC PROCEDURE TODAY AT THE [facility]:   Refer to the procedure report that was given to you for any specific questions about what was found during the examination.  If the procedure report does not answer your questions, please call your gastroenterologist to clarify.  If you requested that your care partner not be given the details of your procedure findings, then the procedure report has been included in a sealed envelope for you to review at your convenience later. ? ?YOU SHOULD EXPECT: Some feelings of bloating in the abdomen. Passage of more gas than usual.  Walking can help get rid of the air that was put into your GI tract during the procedure and reduce the bloating. If you had a lower endoscopy (such as a colonoscopy or flexible sigmoidoscopy) you may notice spotting of blood in your stool or on the toilet paper. If you underwent a bowel prep for your procedure, you may not have a normal bowel movement for a few days. ? ?Please Note:  You might notice some irritation and congestion in your nose or some drainage.  This is from the oxygen used during your procedure.  There is no need for concern and it should clear up in a day or so. ? ?SYMPTOMS TO REPORT IMMEDIATELY: ? ?Following lower endoscopy (colonoscopy or flexible sigmoidoscopy): ? Excessive amounts of blood in the stool ? Significant tenderness or worsening of abdominal pains ? Swelling of the abdomen that is new, acute ? Fever of 100?F or higher ? ? ?For urgent or emergent issues, a gastroenterologist can be reached at any hour by calling [phone]. ?Do not use MyChart messaging for urgent concerns.  ? ? ?DIET:  We do recommend a small meal at first, but then you may proceed to your regular diet.  Drink plenty of fluids but you should avoid alcoholic beverages for 24 hours. ? ?ACTIVITY:  You should plan  to take it easy for the rest of today and you should NOT DRIVE or use heavy machinery until tomorrow (because of the sedation medicines used during the test).   ? ?FOLLOW UP: ?Our staff will call the number listed on your records 48-72 hours following your procedure to check on you and address any questions or concerns that you may have regarding the information given to you following your procedure. If we do not reach you, we will leave a message.  We will attempt to reach you two times.  During this call, we will ask if you have developed any symptoms of COVID 19. If you develop any symptoms (ie: fever, flu-like symptoms, shortness of breath, cough etc.) before then, please call [phone].  If you test positive for Covid 19 in the 2 weeks post procedure, please call and report this information to us.   ? ?If any biopsies were taken you will be contacted by phone or by letter within the next 1-3 weeks.  Please call us at [phone] if you have not heard about the biopsies in 3 weeks.  ? ? ?SIGNATURES/CONFIDENTIALITY: ?You and/or your care partner have signed paperwork which will be entered into your electronic medical record.  These signatures attest to the fact that that the information above on your After Visit Summary has been reviewed and is understood.  Full responsibility of the confidentiality of this discharge information lies with you and/or your care-partner.  ?

## 2020-03-26 NOTE — Progress Notes (Signed)
pt tolerated well. VSS. awake and to recovery. Report given to RN.  

## 2020-03-26 NOTE — Progress Notes (Signed)
Called to room to assist during endoscopic procedure.  Patient ID and intended procedure confirmed with present staff. Received instructions for my participation in the procedure from the performing physician.  

## 2020-03-26 NOTE — Op Note (Signed)
[place] Patient Name: Ann [name] Procedure Date: 03/26/2020 3:21 PM MRN: [id number] Endoscopist: Jerene Bears , MD Age: 59 Referring MD:  Date of Birth: [birth year] Gender: Female Account #: [id number] Procedure:                Colonoscopy Indications:              High risk colon cancer surveillance: Personal                            history of multiple (3 or more) adenomas, Last                            colonoscopy: April 2015 Medicines:                Monitored Anesthesia Care Procedure:                Pre-Anesthesia Assessment:                           - Prior to the procedure, a History and Physical                            was performed, and patient medications and                            allergies were reviewed. The patient's tolerance of                            previous anesthesia was also reviewed. The risks                            and benefits of the procedure and the sedation                            options and risks were discussed with the patient.                            All questions were answered, and informed consent                            was obtained. Prior Anticoagulants: The patient has                            taken no previous anticoagulant or antiplatelet                            agents. ASA Grade Assessment: III - A patient with                            severe systemic disease. After reviewing the risks                            and benefits, the patient was deemed in  satisfactory condition to undergo the procedure.                           After obtaining informed consent, the colonoscope                            was passed under direct vision. Throughout the                            procedure, the patient's blood pressure, pulse, and                            oxygen saturations were monitored continuously. The                            Colonoscope was introduced through the  anus and                            advanced to the cecum, identified by appendiceal                            orifice and ileocecal valve. The colonoscopy was                            performed without difficulty. The patient tolerated                            the procedure well. The quality of the bowel                            preparation was good. The ileocecal valve,                            appendiceal orifice, and rectum were photographed. Scope In: 3:29:53 PM Scope Out: 3:45:09 PM Scope Withdrawal Time: 0 hours 9 minutes 18 seconds  Total Procedure Duration: 0 hours 15 minutes 16 seconds  Findings:                 The digital rectal exam was normal.                           A 3 mm polyp was found in the ascending colon. The                            polyp was sessile. The polyp was removed with a                            cold snare. Resection and retrieval were complete.                           A 5 mm polyp was found in the transverse colon. The                            polyp was  sessile. The polyp was removed with a                            cold snare. Resection and retrieval were complete.                           A 2 mm polyp was found in the rectum. The polyp was                            sessile. The polyp was removed with a cold snare.                            Resection and retrieval were complete.                           The exam was otherwise without abnormality on                            direct and retroflexion views. Complications:            No immediate complications. Estimated Blood Loss:     Estimated blood loss was minimal. Impression:               - One 3 mm polyp in the ascending colon, removed                            with a cold snare. Resected and retrieved.                           - One 5 mm polyp in the transverse colon, removed                            with a cold snare. Resected and retrieved.                           -  One 2 mm polyp in the rectum, removed with a cold                            snare. Resected and retrieved.                           - The examination was otherwise normal on direct                            and retroflexion views. Recommendation:           - Patient has a contact number available for                            emergencies. The signs and symptoms of potential                            delayed complications were discussed with the  patient. Return to normal activities tomorrow.                            Written discharge instructions were provided to the                            patient.                           - Resume previous diet.                           - Continue present medications.                           - Await pathology results.                           - Repeat colonoscopy is recommended for                            surveillance. The colonoscopy date will be                            determined after pathology results from today's                            exam become available for review. Jerene Bears, MD 03/26/2020 3:48:25 PM This report has been signed electronically.

## 2020-03-27 MED FILL — LISINOPRIL-HCTZ 10-12.5 MG: 10-12.5 | 30 days supply | Qty: 30 | Fill #5

## 2020-03-28 ENCOUNTER — Telehealth: Payer: Self-pay

## 2020-03-28 NOTE — Telephone Encounter (Signed)
°  Follow up Call-  Call back number 03/26/2020  Post procedure Call Back phone  # [phone]  Permission to leave phone message Yes  Some recent data might be hidden     Patient questions:  Do you have a fever, pain , or abdominal swelling? No. Pain Score  0 *  Have you tolerated food without any problems? Yes.    Have you been able to return to your normal activities? Yes.    Do you have any questions about your discharge instructions: Diet   No. Medications  No. Follow up visit  No.  Do you have questions or concerns about your Care? No.  Actions: * If pain score is 4 or above: 1. No action needed, pain <4.Have you developed a fever since your procedure? no  2.   Have you had an respiratory symptoms (SOB or cough) since your procedure? no  3.   Have you tested positive for COVID 19 since your procedure no  4.   Have you had any family members/close contacts diagnosed with the COVID 19 since your procedure?  no   If yes to any of these questions please route to Joylene John, RN and Joella Prince, RN

## 2020-04-02 ENCOUNTER — Encounter: Payer: Self-pay | Admitting: Internal Medicine

## 2020-04-04 ENCOUNTER — Ambulatory Visit: Payer: 59 | Admitting: Nurse Practitioner

## 2020-04-24 ENCOUNTER — Other Ambulatory Visit: Payer: Self-pay | Admitting: Nurse Practitioner

## 2020-04-24 DIAGNOSIS — I10 Essential (primary) hypertension: Secondary | ICD-10-CM

## 2020-04-24 MED FILL — LISINOPRIL-HCTZ 10-12.5 MG: 10-12.5 | 30 days supply | Qty: 30 | Fill #0

## 2020-05-23 ENCOUNTER — Ambulatory Visit: Payer: 59 | Admitting: Nurse Practitioner

## 2020-05-24 ENCOUNTER — Other Ambulatory Visit: Payer: Self-pay | Admitting: Nurse Practitioner

## 2020-05-24 DIAGNOSIS — I10 Essential (primary) hypertension: Secondary | ICD-10-CM

## 2020-05-25 MED FILL — LISINOPRIL-HCTZ 10-12.5 MG: 10-12.5 | 30 days supply | Qty: 30 | Fill #0

## 2020-06-26 ENCOUNTER — Other Ambulatory Visit: Payer: Self-pay | Admitting: Nurse Practitioner

## 2020-06-26 DIAGNOSIS — I10 Essential (primary) hypertension: Secondary | ICD-10-CM

## 2020-06-26 MED FILL — LISINOPRIL-HCTZ 10-12.5 MG: 10-12.5 | 30 days supply | Qty: 30 | Fill #0

## 2020-07-06 ENCOUNTER — Encounter: Payer: Self-pay | Admitting: Nurse Practitioner

## 2020-07-06 ENCOUNTER — Ambulatory Visit: Payer: 59 | Attending: Nurse Practitioner | Admitting: Nurse Practitioner

## 2020-07-06 ENCOUNTER — Other Ambulatory Visit: Payer: Self-pay | Admitting: Nurse Practitioner

## 2020-07-06 ENCOUNTER — Other Ambulatory Visit: Payer: Self-pay

## 2020-07-06 ENCOUNTER — Ambulatory Visit: Payer: 59 | Admitting: Nurse Practitioner

## 2020-07-06 VITALS — BP 109/73 | HR 82 | Temp 97.6°F | Ht 62.0 in | Wt 232.0 lb

## 2020-07-06 DIAGNOSIS — I10 Essential (primary) hypertension: Secondary | ICD-10-CM

## 2020-07-06 DIAGNOSIS — E785 Hyperlipidemia, unspecified: Secondary | ICD-10-CM

## 2020-07-06 DIAGNOSIS — M1A09X1 Idiopathic chronic gout, multiple sites, with tophus (tophi): Secondary | ICD-10-CM | POA: Diagnosis not present

## 2020-07-06 MED ORDER — LISINOPRIL-HYDROCHLOROTHIAZIDE 10-12.5 MG PO TABS: 1.0000 | ORAL_TABLET | Freq: Every day | ORAL | 1 refills | Status: DC

## 2020-07-06 NOTE — Progress Notes (Signed)
Assessment & Plan:  Ann [name] was seen today for hypertension.  Diagnoses and all orders for this visit:  Essential hypertension -     CMP14+EGFR -     lisinopril-hydrochlorothiazide (ZESTORETIC) 10-12.5 MG tablet; Take 1 tablet by mouth daily. Continue all antihypertensives as prescribed.  Remember to bring in your blood pressure log with you for your follow up appointment.  DASH/Mediterranean Diets are healthier choices for HTN.    Dyslipidemia, goal LDL below 70 -     Lipid panel INSTRUCTIONS: Work on a low fat, heart healthy diet and participate in regular aerobic exercise program by working out at least 150 minutes per week; 5 days a week-30 minutes per day. Avoid red meat/beef/steak,  fried foods. junk foods, sodas, sugary drinks, unhealthy snacking, alcohol and smoking.  Drink at least 80 oz of water per day and monitor your carbohydrate intake daily.   Idiopathic chronic gout of multiple sites with tophus -     Uric Acid    Patient has been counseled on age-appropriate routine health concerns for screening and prevention. These are reviewed and up-to-date. Referrals have been placed accordingly. Immunizations are up-to-date or declined.    Subjective:   Chief Complaint  Patient presents with  . Hypertension    Patient is here for hypertension follow up.    HPI Ann [name] 60 y.o. female presents to office today for follow up.  has a past medical history of Anemia, Anxiety, Dysrhythmia, GERD (gastroesophageal reflux disease), Headache(784.0), Hypertension, and SVD (spontaneous vaginal delivery).  Patient has been counseled on age-appropriate routine health concerns for screening and prevention. These are reviewed and up-to-date. Referrals have been placed accordingly. Immunizations are up-to-date or declined.    MAMMOGRAM UTD  Essential Hypertension Blood pressure is well controlled.  Denies chest pain, shortness of breath, palpitations, lightheadedness, dizziness,  headaches or BLE edema.  She is currently taking lisinopril-hydrochlorothiazide 10-12.5 mg daily as prescribed. BP Readings from Last 3 Encounters:  07/06/20 109/73  03/26/20 118/78  10/21/19 133/82    Review of Systems  Constitutional: Negative for fever, malaise/fatigue and weight loss.  HENT: Negative.  Negative for nosebleeds.   Eyes: Negative.  Negative for blurred vision, double vision and photophobia.  Respiratory: Negative.  Negative for cough and shortness of breath.   Cardiovascular: Negative.  Negative for chest pain, palpitations and leg swelling.  Gastrointestinal: Negative.  Negative for heartburn, nausea and vomiting.  Musculoskeletal: Negative.  Negative for myalgias.  Neurological: Negative.  Negative for dizziness, focal weakness, seizures and headaches.  Psychiatric/Behavioral: Negative.  Negative for suicidal ideas.    Past Medical History:  Diagnosis Date  . Anemia   . Anxiety   . Dysrhythmia    palpitations  . GERD (gastroesophageal reflux disease)   . Headache(784.0)   . Hypertension   . SVD (spontaneous vaginal delivery)    x 3    Past Surgical History:  Procedure Laterality Date  . ABDOMINAL HYSTERECTOMY N/A 03/23/2013   Procedure: HYSTERECTOMY ABDOMINAL;  Surgeon: Ann Drafts, MD;  Location: [facility];  Service: Gynecology;  Laterality: N/A;  . BILATERAL SALPINGECTOMY Bilateral 03/23/2013   Procedure: BILATERAL SALPINGECTOMY;  Surgeon: Ann Drafts, MD;  Location: [facility];  Service: Gynecology;  Laterality: Bilateral;  . COLONOSCOPY  08/2013   Ann [name] - polyp  . COLPOSCOPY    . FOOT SURGERY     right 5th toe-bone removed n1997  . WISDOM TOOTH EXTRACTION      Family History  Problem Relation Age of Onset  .  Arthritis Mother   . Stroke Mother   . Hypertension Mother   . Atrial fibrillation Mother   . Arthritis Father   . Cancer Father        prostate  . Stroke Father   . Alcohol abuse Brother   . Stroke Brother   .  Cancer Paternal Aunt        breast and uterine  . Colon cancer Neg Hx   . Rectal cancer Neg Hx   . Stomach cancer Neg Hx   . Colon polyps Neg Hx   . Esophageal cancer Neg Hx     Social History Reviewed with no changes to be made today.   Outpatient Medications Prior to Visit  Medication Sig Dispense Refill  . aspirin EC 81 MG tablet Take 81 mg by mouth daily. Swallow whole.    . Multiple Vitamin (MULTIVITAMIN PO) Take 1 tablet by mouth daily.    Marland Kitchen lisinopril-hydrochlorothiazide (ZESTORETIC) 10-12.5 MG tablet TAKE 1 TABLET BY MOUTH DAILY. NEEDS VISIT WITH PCP (Ann [name]) FOR MORE REFILLS. 30 tablet 0   No facility-administered medications prior to visit.    No Known Allergies     Objective:    BP 109/73 (BP Location: Left Arm, Patient Position: Sitting, Cuff Size: Large)   Pulse 82   Temp 97.6 F (36.4 C) (Oral)   Ht _0  (1.575 m)   Wt 232 lb (105.2 kg)   LMP  (LMP Unknown)   SpO2 98%   BMI 42.43 kg/m  Wt Readings from Last 3 Encounters:  07/06/20 232 lb (105.2 kg)  03/26/20 232 lb (105.2 kg)  03/07/20 232 lb (105.2 kg)    Physical Exam Vitals and nursing note reviewed.  Constitutional:      Appearance: She is well-developed and well-nourished.  HENT:     Head: Normocephalic and atraumatic.  Eyes:     Extraocular Movements: EOM normal.  Cardiovascular:     Rate and Rhythm: Normal rate and regular rhythm.     Pulses: Intact distal pulses.     Heart sounds: Normal heart sounds. No murmur heard. No friction rub. No gallop.   Pulmonary:     Effort: Pulmonary effort is normal. No tachypnea or respiratory distress.     Breath sounds: Normal breath sounds. No decreased breath sounds, wheezing, rhonchi or rales.  Chest:     Chest wall: No tenderness.  Abdominal:     General: Bowel sounds are normal.     Palpations: Abdomen is soft.  Musculoskeletal:        General: No edema. Normal range of motion.     Cervical back: Normal range of motion.  Skin:    General:  Skin is warm and dry.  Neurological:     Mental Status: She is alert and oriented to person, place, and time.     Coordination: Coordination normal.  Psychiatric:        Mood and Affect: Mood and affect normal.        Behavior: Behavior normal. Behavior is cooperative.        Thought Content: Thought content normal.        Judgment: Judgment normal.          Patient has been counseled extensively about nutrition and exercise as well as the importance of adherence with medications and regular follow-up. The patient was given clear instructions to go to ER or return to medical center if symptoms don't improve, worsen or new problems develop. The patient verbalized understanding.  Follow-up: Return in about 3 months (around 10/03/2020).   Gildardo Pounds, FNP-BC [facility] and [facility] [place], [place]   07/06/2020, 9:56 AM

## 2020-07-07 LAB — CMP14+EGFR
ALT: 13 IU/L (ref 0–32)
AST: 17 IU/L (ref 0–40)
Albumin/Globulin Ratio: 1.3 (ref 1.2–2.2)
Albumin: 4.1 g/dL (ref 3.8–4.9)
Alkaline Phosphatase: 79 IU/L (ref 44–121)
BUN/Creatinine Ratio: 17 (ref 9–23)
BUN: 18 mg/dL (ref 6–24)
Bilirubin Total: 0.4 mg/dL (ref 0.0–1.2)
CO2: 24 mmol/L (ref 20–29)
Calcium: 9.7 mg/dL (ref 8.7–10.2)
Chloride: 101 mmol/L (ref 96–106)
Creatinine, Ser: 1.03 mg/dL — ABNORMAL HIGH (ref 0.57–1.00)
GFR calc Af Amer: 69 mL/min/{1.73_m2} (ref >59)
GFR calc non Af Amer: 60 mL/min/{1.73_m2} (ref >59)
Globulin, Total: 3.2 g/dL (ref 1.5–4.5)
Glucose: 90 mg/dL (ref 65–99)
Potassium: 4.1 mmol/L (ref 3.5–5.2)
Sodium: 139 mmol/L (ref 134–144)
Total Protein: 7.3 g/dL (ref 6.0–8.5)

## 2020-07-07 LAB — LIPID PANEL
Chol/HDL Ratio: 2.6 ratio (ref 0.0–4.4)
Cholesterol, Total: 194 mg/dL (ref 100–199)
HDL: 75 mg/dL (ref >39)
LDL Chol Calc (NIH): 108 mg/dL — ABNORMAL HIGH (ref 0–99)
Triglycerides: 59 mg/dL (ref 0–149)
VLDL Cholesterol Cal: 11 mg/dL (ref 5–40)

## 2020-07-07 LAB — URIC ACID: Uric Acid: 6.7 mg/dL (ref 3.0–7.2)

## 2020-08-22 MED FILL — Lisinopril & Hydrochlorothiazide Tab 10-12.5 MG: ORAL | 30 days supply | Qty: 30 | Fill #0 | Status: AC

## 2020-08-27 ENCOUNTER — Other Ambulatory Visit: Payer: Self-pay

## 2020-08-28 ENCOUNTER — Other Ambulatory Visit: Payer: Self-pay

## 2020-09-24 MED FILL — Lisinopril & Hydrochlorothiazide Tab 10-12.5 MG: ORAL | 30 days supply | Qty: 30 | Fill #1 | Status: AC

## 2020-09-25 ENCOUNTER — Other Ambulatory Visit: Payer: Self-pay

## 2020-09-26 ENCOUNTER — Other Ambulatory Visit: Payer: Self-pay

## 2020-10-26 ENCOUNTER — Other Ambulatory Visit: Payer: Self-pay

## 2020-10-26 MED FILL — Lisinopril & Hydrochlorothiazide Tab 10-12.5 MG: ORAL | 30 days supply | Qty: 30 | Fill #2 | Status: AC

## 2020-10-31 ENCOUNTER — Other Ambulatory Visit: Payer: Self-pay

## 2020-11-28 ENCOUNTER — Other Ambulatory Visit: Payer: Self-pay

## 2020-11-28 MED FILL — Lisinopril & Hydrochlorothiazide Tab 10-12.5 MG: ORAL | 30 days supply | Qty: 30 | Fill #3 | Status: AC

## 2020-11-30 ENCOUNTER — Other Ambulatory Visit: Payer: Self-pay

## 2020-12-28 MED FILL — Lisinopril & Hydrochlorothiazide Tab 10-12.5 MG: ORAL | 30 days supply | Qty: 30 | Fill #4 | Status: AC

## 2020-12-31 ENCOUNTER — Other Ambulatory Visit: Payer: Self-pay

## 2021-01-22 ENCOUNTER — Other Ambulatory Visit: Payer: Self-pay | Admitting: Nurse Practitioner

## 2021-01-22 DIAGNOSIS — Z1231 Encounter for screening mammogram for malignant neoplasm of breast: Secondary | ICD-10-CM

## 2021-01-24 ENCOUNTER — Other Ambulatory Visit: Payer: Self-pay

## 2021-01-24 ENCOUNTER — Ambulatory Visit: Payer: Self-pay | Admitting: *Deleted

## 2021-01-24 ENCOUNTER — Encounter (HOSPITAL_COMMUNITY): Payer: Self-pay

## 2021-01-24 ENCOUNTER — Encounter: Payer: Self-pay | Admitting: Nurse Practitioner

## 2021-01-24 ENCOUNTER — Emergency Department (HOSPITAL_COMMUNITY)
Admission: EM | Admit: 2021-01-24 | Discharge: 2021-01-24 | Disposition: A | Discharge status: Home / Self Care | Payer: No Typology Code available for payment source | Attending: Emergency Medicine | Admitting: Emergency Medicine

## 2021-01-24 ENCOUNTER — Emergency Department (HOSPITAL_COMMUNITY): Payer: No Typology Code available for payment source

## 2021-01-24 DIAGNOSIS — I10 Essential (primary) hypertension: Secondary | ICD-10-CM | POA: Insufficient documentation

## 2021-01-24 DIAGNOSIS — Z7982 Long term (current) use of aspirin: Secondary | ICD-10-CM | POA: Diagnosis not present

## 2021-01-24 DIAGNOSIS — Z79899 Other long term (current) drug therapy: Secondary | ICD-10-CM | POA: Insufficient documentation

## 2021-01-24 DIAGNOSIS — R110 Nausea: Secondary | ICD-10-CM | POA: Diagnosis not present

## 2021-01-24 DIAGNOSIS — M79602 Pain in left arm: Secondary | ICD-10-CM | POA: Insufficient documentation

## 2021-01-24 LAB — COMPREHENSIVE METABOLIC PANEL
ALT: 15 U/L (ref 0–44)
AST: 22 U/L (ref 15–41)
Albumin: 4.4 g/dL (ref 3.5–5.0)
Alkaline Phosphatase: 49 U/L (ref 38–126)
Anion gap: 10 (ref 5–15)
BUN: 12 mg/dL (ref 6–20)
CO2: 26 mmol/L (ref 22–32)
Calcium: 9.7 mg/dL (ref 8.9–10.3)
Chloride: 100 mmol/L (ref 98–111)
Creatinine, Ser: 1 mg/dL (ref 0.44–1.00)
GFR, Estimated: >60 mL/min (ref >60)
Glucose, Bld: 94 mg/dL (ref 70–99)
Potassium: 3.7 mmol/L (ref 3.5–5.1)
Sodium: 136 mmol/L (ref 135–145)
Total Bilirubin: 1 mg/dL (ref 0.3–1.2)
Total Protein: 8.3 g/dL — ABNORMAL HIGH (ref 6.5–8.1)

## 2021-01-24 LAB — CBC WITH DIFFERENTIAL/PLATELET
Abs Immature Granulocytes: 0.01 10*3/uL (ref 0.00–0.07)
Basophils Absolute: 0 10*3/uL (ref 0.0–0.1)
Basophils Relative: 0 %
Eosinophils Absolute: 0.1 10*3/uL (ref 0.0–0.5)
Eosinophils Relative: 1 %
HCT: 45.2 % (ref 36.0–46.0)
Hemoglobin: 14.6 g/dL (ref 12.0–15.0)
Immature Granulocytes: 0 %
Lymphocytes Relative: 28 %
Lymphs Abs: 1.6 10*3/uL (ref 0.7–4.0)
MCH: 29.3 pg (ref 26.0–34.0)
MCHC: 32.3 g/dL (ref 30.0–36.0)
MCV: 90.8 fL (ref 80.0–100.0)
Monocytes Absolute: 0.4 10*3/uL (ref 0.1–1.0)
Monocytes Relative: 8 %
Neutro Abs: 3.5 10*3/uL (ref 1.7–7.7)
Neutrophils Relative %: 63 %
Platelets: 232 10*3/uL (ref 150–400)
RBC: 4.98 MIL/uL (ref 3.87–5.11)
RDW: 14.7 % (ref 11.5–15.5)
WBC: 5.5 10*3/uL (ref 4.0–10.5)
nRBC: 0 % (ref 0.0–0.2)

## 2021-01-24 LAB — LIPASE, BLOOD: Lipase: 27 U/L (ref 11–51)

## 2021-01-24 LAB — TROPONIN I (HIGH SENSITIVITY): Troponin I (High Sensitivity): <2 ng/L (ref <18)

## 2021-01-24 MED ORDER — DICLOFENAC SODIUM 1 % EX GEL
2.0000 g | Freq: Four times a day (QID) | CUTANEOUS | 0 refills | Status: DC
  Filled 2021-01-24 – 2021-01-25 (×2): qty 100, 12d supply, fill #0

## 2021-01-24 NOTE — ED Provider Notes (Signed)
[facility] DEPT Provider Note   CSN: [id number] Arrival date & time: 01/24/21  1211     History Chief Complaint  Patient presents with   Arm Pain    Ann [name] is a 60 y.o. female.  With past medical history of GERD, hypertension who presents to the emergency department for left arm pain.   States that left arm pain has been going on for 1 week.  Describes it as intermittent, dull, nonradiating.  When describing the pain she points to the deltoid of the left arm.  Denies aggravating or alleviating factors.  Denies any recent heavy lifting, trauma, falls onto the arm.  States that yesterday she had a period of left jaw and neck pain that lasted "a few seconds."  She has also had intermittent nausea without vomiting since the arm pain.  She states it is not always consistent with the arm pain.Denies numbness or tingling to the bilateral upper extremities.  Of note she called her primary care physician today to be seen there however, they told her to present to the emergency department.  She has a follow-up appointment tomorrow.   Denies fever, chest pain, palpitations, shortness of breath, cough, abdominal pain, lower extremity swelling, lightheadedness, dizziness.  She denies recent travel, or long trips with prolonged immobility, surgery, exogenous estrogen use, smoking or history of blood clots.   Arm Pain Pertinent negatives include no chest pain, no abdominal pain and no shortness of breath.  Past Medical History:  Diagnosis Date   Anemia    Anxiety    Dysrhythmia    palpitations   GERD (gastroesophageal reflux disease)    Headache(784.0)    Hypertension    SVD (spontaneous vaginal delivery)    x 3    Patient Active Problem List   Diagnosis Date Noted   Palpitations 02/06/2014   Anxiety state, unspecified 11/18/2013   Abdominal pain 08/19/2013   Atypical chest pain 02/17/2013   Essential hypertension, benign 12/24/2012   Neck muscle  spasm 08/06/2012   Shoulder pain 08/06/2012   Headache(784.0) 07/03/2010   Chest pain 07/03/2010   Elevated BP 07/03/2010    Past Surgical History:  Procedure Laterality Date   ABDOMINAL HYSTERECTOMY N/A 03/23/2013   Procedure: HYSTERECTOMY ABDOMINAL;  Surgeon: Lavonia Drafts, MD;  Location: [facility];  Service: Gynecology;  Laterality: N/A;   BILATERAL SALPINGECTOMY Bilateral 03/23/2013   Procedure: BILATERAL SALPINGECTOMY;  Surgeon: Lavonia Drafts, MD;  Location: [facility];  Service: Gynecology;  Laterality: Bilateral;   COLONOSCOPY  08/2013   Pyrtle - polyp   COLPOSCOPY     FOOT SURGERY     right 5th toe-bone removed n1997   WISDOM TOOTH EXTRACTION       OB History     Gravida  4   Para  3   Term  2   Preterm  1   AB  1   Living  2      SAB  0   IAB  1   Ectopic  0   Multiple  0   Live Births              Family History  Problem Relation Age of Onset   Arthritis Mother    Stroke Mother    Hypertension Mother    Atrial fibrillation Mother    Arthritis Father    Cancer Father        prostate   Stroke Father    Alcohol abuse Brother  Stroke Brother    Cancer Paternal Aunt        breast and uterine   Colon cancer Neg Hx    Rectal cancer Neg Hx    Stomach cancer Neg Hx    Colon polyps Neg Hx    Esophageal cancer Neg Hx     Social History   Tobacco Use   Smoking status: Never   Smokeless tobacco: Never  Vaping Use   Vaping Use: Never used  Substance Use Topics   Alcohol use: Yes    Alcohol/week: 7.0 standard drinks    Types: 7 Glasses of wine per week   Drug use: No    Home Medications Prior to Admission medications   Medication Sig Start Date End Date Taking? Authorizing Provider  aspirin EC 81 MG tablet Take 81 mg by mouth daily. Swallow whole.    [provider]  lisinopril-hydrochlorothiazide (ZESTORETIC) 10-12.5 MG tablet TAKE 1 TABLET BY MOUTH DAILY. 07/06/20 07/06/21  Gildardo Pounds, NP  Multiple  Vitamin (MULTIVITAMIN PO) Take 1 tablet by mouth daily.    [provider]    Allergies    Patient has no known allergies.  Review of Systems   Review of Systems  Constitutional:  Negative for fever.  Respiratory:  Negative for cough, chest tightness and shortness of breath.   Cardiovascular:  Negative for chest pain, palpitations and leg swelling.  Gastrointestinal:  Positive for nausea. Negative for abdominal pain and vomiting.  Musculoskeletal:  Positive for myalgias.  Neurological:  Negative for dizziness and light-headedness.  All other systems reviewed and are negative.  Physical Exam Updated Vital Signs BP (!) 123/94   Pulse 68   Temp 98.6 F (37 C) (Oral)   Resp 18   Ht '5\' 2"'$  (1.575 m)   Wt 101.2 kg   LMP  (LMP Unknown)   SpO2 100%   BMI 40.79 kg/m   Physical Exam Vitals and nursing note reviewed.  Constitutional:      General: She is not in acute distress.    Appearance: Normal appearance.  HENT:     Head: Normocephalic and atraumatic.  Eyes:     Extraocular Movements: Extraocular movements intact.     Pupils: Pupils are equal, round, and reactive to light.  Cardiovascular:     Rate and Rhythm: Normal rate and regular rhythm.     Pulses: Normal pulses.     Heart sounds: Normal heart sounds.  Pulmonary:     Effort: Pulmonary effort is normal.     Breath sounds: Normal breath sounds.  Abdominal:     Palpations: Abdomen is soft.  Musculoskeletal:        General: Tenderness present. No swelling. Normal range of motion.     Right shoulder: Normal.     Left shoulder: Tenderness present.       Arms:     Cervical back: Normal range of motion and neck supple. No tenderness.     Right lower leg: No edema.     Left lower leg: No edema.     Comments: Strength 5 out of 5 in bilateral upper extremities.  Sensory neural intact bilateral.  Denies numbness or tingling to the bilateral upper extremities.    Skin:    General: Skin is warm and dry.      Capillary Refill: Capillary refill takes less than 2 seconds.  Neurological:     General: No focal deficit present.     Mental Status: She is alert and oriented  to person, place, and time. Mental status is at baseline.  Psychiatric:        Mood and Affect: Mood normal.        Behavior: Behavior normal.    ED Results / Procedures / Treatments   Labs (all labs ordered are listed, but only abnormal results are displayed) Labs Reviewed  COMPREHENSIVE METABOLIC PANEL - Abnormal; Notable for the following components:      Result Value   Total Protein 8.3 (*)    All other components within normal limits  LIPASE, BLOOD  CBC WITH DIFFERENTIAL/PLATELET  TROPONIN I (HIGH SENSITIVITY)  TROPONIN I (HIGH SENSITIVITY)    EKG None  Radiology DG Chest 2 View  Result Date: 01/24/2021 CLINICAL DATA:  Left arm pain with epigastric pain, radiates into back and left sided jaw. EXAM: CHEST - 2 VIEW COMPARISON:  Radiograph 02/18/2013 FINDINGS: The heart size and mediastinal contours are within normal limits.No focal airspace disease. No pleural effusion or pneumothorax.No acute osseous abnormality. Thoracic spondylosis. Bilateral shoulder degenerative changes. IMPRESSION: No evidence of acute cardiopulmonary disease. Electronically Signed   By: Maurine Simmering M.D.   On: 01/24/2021 13:44    Procedures Procedures   Medications Ordered in ED Medications - No data to display  ED Course  I have reviewed the triage vital signs and the nursing notes.  Pertinent labs & imaging results that were available during my care of the patient were reviewed by me and considered in my medical decision making (see chart for details).    MDM Rules/Calculators/A&P Mistey Fimbres is a 60 year old female who presents with past medical history, HPI, physical exam as described above.  Initial differential diagnosis to include ACS, acute PE, left arm DVT, musculoskeletal pain.  Troponin x1 negative, EKG without signs of  ischemia or infarction.  Doubt ACS. Low suspicion for acute pulmonary embolism.  Wells score 0. Low suspicion for left arm DVT.  The ventral upper arm is without redness, swelling, or tenderness.  She reports no risk factors for DVT.  No history of DVT or PE.  No family history of clotting disorders.  Likely musculoskeletal pain as I can reproduce the pain on palpation of the deltoid and just distal to this area.  She endorses much increased pain when I am doing this.  After more discussion she decides that she lifts her mother daily to help her get around.  Likely strain.  Will prescribe Voltaren gel.  Can also take Tylenol and ibuprofen over-the-counter and rest. Final Clinical Impression(s) / ED Diagnoses Final diagnoses:  Left arm pain    Rx / DC Orders ED Discharge Orders          Ordered    diclofenac Sodium (VOLTAREN) 1 % GEL  4 times daily        01/24/21 1533             Mickie Hillier, PA-C 01/24/21 1534    Godfrey Pick, MD 01/25/21 1520

## 2021-01-24 NOTE — Telephone Encounter (Signed)
Reason for Disposition  Sounds like a life-threatening emergency to the triager    Sounds like cardiac related symptoms.   Instructed to call 911  Answer Assessment - Initial Assessment Questions 1. ONSET: "When did the pain start?"     Pt called in.  C/o pain in left neck, jaw, left shoulder and upper arm for a week now.   I'm having nausea.    I'm having pain in middle of upper back.   2. LOCATION: "Where is the pain located?"     See above I sweat a lot anyway.   No shortness of breath.    3. PAIN: "How bad is the pain?" (Scale 1-10; or mild, moderate, severe)   - MILD (1-3): doesn't interfere with normal activities   - MODERATE (4-7): interferes with normal activities (e.g., work or school) or awakens from sleep   - SEVERE (8-10): excruciating pain, unable to do any normal activities, unable to hold a cup of water     5/10 on pain scale.   I know it's there. 4. WORK OR EXERCISE: "Has there been any recent work or exercise that involved this part of the body?"     No 5. CAUSE: "What do you think is causing the arm pain?"     Maybe indigestion 6. OTHER SYMPTOMS: "Do you have any other symptoms?" (e.g., neck pain, swelling, rash, fever, numbness, weakness)     Having pain in left side, left shoulder, part way down left arm, also in left neck and jaw with radiation to upper back in the middle about where my bra strap is. 7. PREGNANCY: "Is there any chance you are pregnant?" "When was your last menstrual period?"     Not asked due to age  Protocols used: Arm Pain-A-AH

## 2021-01-24 NOTE — Telephone Encounter (Signed)
Pt called in c/o left sided pain that is in her left shoulder, down left arm, in left neck and jaw and radiating to upper back in the middle the level of her bra strap.  See triage notes.  I instructed her to call 911 which she is agreeable to doing now.  I sent my notes to [facility] for Geryl Rankins, NP

## 2021-01-24 NOTE — ED Triage Notes (Signed)
Patient c/o intermittent left upper arm pain x 1 week. Patient states she had intermittent left jaw, nausea. and left neck pain, but none today.  Patient denies SOB or diaphoresis.

## 2021-01-24 NOTE — ED Notes (Signed)
An After Visit Summary was printed and given to the patient. Discharge instructions given and no further questions at this time.  Pt denies chest pain.

## 2021-01-24 NOTE — ED Provider Notes (Signed)
Emergency Medicine Provider Triage Evaluation Note  Ann [name] , a 60 y.o. female  was evaluated in triage.  Pt complains of left arm pain intermittently for a week.  She states that with this she has had nausea.  Her pain doesn't change with arm movement or touch.  She had an episode yesterday when she had the arm pain that radiated into her left sided jaw, her back and had nausea associated with out vomiting and pain in her epigastric area.  No leg swelling. She doesn't smoke.    Review of Systems  Positive: Epigastric pain, nausea, left arm pain with jaw pain.  Negative: Fevers, cough, shortness of breath  Physical Exam  BP (!) 149/96 (BP Location: Left Arm)   Pulse 87   Temp 98.6 F (37 C) (Oral)   Resp 18   Ht '5\' 2"'$  (1.575 m)   Wt 101.2 kg   LMP  (LMP Unknown)   SpO2 100%   BMI 40.79 kg/m  Gen:   Awake, no distress   Resp:  Normal effort  MSK:   Moves extremities without difficulty  Other:  2+ Left radial pulse.  RRR, lungs CTAB  Medical Decision Making  Medically screening exam initiated at 12:51 PM.  Appropriate orders placed.  [name] was informed that the remainder of the evaluation will be completed by another provider, this initial triage assessment does not replace that evaluation, and the importance of remaining in the ED until their evaluation is complete.  Story is concerning for cardiac cause, especially given radiation into jaw and back with associated nausea.  Will order labs, EKG, CXR.    Lorin Glass, PA-C 01/24/21 1301    Lacretia Leigh, MD 01/26/21 1150

## 2021-01-24 NOTE — Telephone Encounter (Signed)
FYI

## 2021-01-24 NOTE — Discharge Instructions (Addendum)
You were seen in the emergency department today for left arm pain.  Because this could be similar to pain associated with cardiac conditions we obtained an EKG which was reassuring.  We also obtain a chest x-ray which showed no signs of pneumonia or any other abnormalities.  All of your lab work was reassuring.  When I performed your physical exam it appears that you are having some tenderness right over your left arm near your deltoid.  It is possible that when you are lifting your mother you strained this area.  Advised that you alternate Tylenol and ibuprofen as needed for the pain.  Additionally I am prescribing you Voltaren gel that you can rub over the area.  You may also alternate ice and heat for pain relief.  You may return to emergency department for any reason including increased pain in the arm, swelling of the arm, shortness of breath, chest pain or palpitations, difficulty breathing.  He also stated you have a follow-up appointment tomorrow with your primary care physician.  Please let them know that you presented to the emergency department.

## 2021-01-25 ENCOUNTER — Ambulatory Visit: Payer: No Typology Code available for payment source | Attending: Nurse Practitioner | Admitting: Nurse Practitioner

## 2021-01-25 ENCOUNTER — Encounter: Payer: Self-pay | Admitting: Nurse Practitioner

## 2021-01-25 ENCOUNTER — Other Ambulatory Visit: Payer: Self-pay

## 2021-01-25 VITALS — BP 116/74 | HR 84 | Ht 64.0 in | Wt 227.4 lb

## 2021-01-25 DIAGNOSIS — F32A Depression, unspecified: Secondary | ICD-10-CM | POA: Diagnosis not present

## 2021-01-25 DIAGNOSIS — Z0001 Encounter for general adult medical examination with abnormal findings: Secondary | ICD-10-CM

## 2021-01-25 DIAGNOSIS — Z23 Encounter for immunization: Secondary | ICD-10-CM | POA: Diagnosis not present

## 2021-01-25 DIAGNOSIS — I10 Essential (primary) hypertension: Secondary | ICD-10-CM

## 2021-01-25 DIAGNOSIS — F419 Anxiety disorder, unspecified: Secondary | ICD-10-CM

## 2021-01-25 DIAGNOSIS — Z0000 Encounter for general adult medical examination without abnormal findings: Secondary | ICD-10-CM

## 2021-01-25 MED ORDER — LISINOPRIL-HYDROCHLOROTHIAZIDE 10-12.5 MG PO TABS
1.0000 | ORAL_TABLET | Freq: Every day | ORAL | 1 refills | Status: DC
  Filled 2021-01-25: qty 30, 30d supply, fill #0
  Filled 2021-02-23: qty 30, 30d supply, fill #1

## 2021-01-25 MED ORDER — CITALOPRAM HYDROBROMIDE 10 MG PO TABS
10.0000 mg | ORAL_TABLET | Freq: Every day | ORAL | 3 refills | Status: DC
  Filled 2021-01-25: qty 30, 30d supply, fill #0

## 2021-01-25 MED ORDER — HYDROXYZINE HCL 10 MG PO TABS
10.0000 mg | ORAL_TABLET | Freq: Three times a day (TID) | ORAL | 1 refills | Status: DC | PRN
  Filled 2021-01-25 – 2021-08-08 (×2): qty 60, 10d supply, fill #0

## 2021-01-25 NOTE — Progress Notes (Signed)
Assessment & Plan:  Ann [name] was seen today for annual exam.  Diagnoses and all orders for this visit:  Encounter for annual physical exam  Essential hypertension -     lisinopril-hydrochlorothiazide (ZESTORETIC) 10-12.5 MG tablet; TAKE 1 TABLET BY MOUTH DAILY. Continue all antihypertensives as prescribed.  Remember to bring in your blood pressure log with you for your follow up appointment.  DASH/Mediterranean Diets are healthier choices for HTN.    Anxiety and depression -     hydrOXYzine (ATARAX/VISTARIL) 10 MG tablet; Take 1-2 tablets (10-20 mg total) by mouth 3 (three) times daily as needed. -     citalopram (CELEXA) 10 MG tablet; Take 1 tablet (10 mg total) by mouth daily.   Patient has been counseled on age-appropriate routine health concerns for screening and prevention. These are reviewed and up-to-date. Referrals have been placed accordingly. Immunizations are up-to-date or declined.    Subjective:   Chief Complaint  Patient presents with   Annual Exam   HPI Ann [name] 60 y.o. female presents to office today for physical. She has a past medical history of Anemia, Anxiety, Dysrhythmia, GERD, Headache(784.0), Hypertension   HTN Well controlled. She is taking lisinopril-hctz 10.12.5 mg daily as prescribed. Denies chest pain, shortness of breath, palpitations, lightheadedness, dizziness, headaches or BLE edema.   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 Endorses increased stress. Symptoms include racing thoughts, insomnia, palpitations and chest pressure. She is interested in SSRI and anxiolytic today.  Depression screen [facility] 2/9 01/25/2021 10/21/2019 02/22/2019 08/05/2017 11/28/2016  Decreased Interest '1 1 1 1 1  '$ Down, Depressed, Hopeless '3 1 2 2 1  '$ PHQ - 2 Score '4 2 3 3 2  '$ Altered sleeping '3 2 2 2 1  '$ Tired, decreased energy '2 1 1 2  1  '$ Change in appetite '3 2 2 2 '$ 0  Feeling bad or failure about yourself  '1 1 2 2 1  '$ Trouble concentrating 1 0 1 1 0  Moving slowly or fidgety/restless 0 0 0 0 -  Suicidal thoughts 0 0 0 0 0  PHQ-9 Score '14 8 11 12 5  '$ Difficult doing work/chores Not difficult at all - - - -    GAD 7 : Generalized Anxiety Score 01/25/2021 10/21/2019 02/22/2019 08/05/2017  Nervous, Anxious, on Edge '3 1 1 1  '$ Control/stop worrying '3 1 1 1  '$ Worry too much - different things '3 2 1 2  '$ Trouble relaxing '3 1 1 1  '$ Restless 1 0 0 1  Easily annoyed or irritable '2 2 1 1  '$ Afraid - awful might happen 2 1 0 1  Total GAD 7 Score '17 8 5 8  '$ Anxiety Difficulty Not difficult at all - - -     Review of Systems  Constitutional:  Negative for fever, malaise/fatigue and weight loss.  HENT: Negative.  Negative for nosebleeds.   Eyes: Negative.  Negative for blurred vision, double vision and photophobia.  Respiratory: Negative.  Negative for cough and shortness of breath.   Cardiovascular: Negative.  Negative for chest pain, palpitations and leg swelling.  Gastrointestinal: Negative.  Negative for heartburn, nausea and vomiting.  Genitourinary: Negative.   Musculoskeletal: Negative.  Negative for myalgias.  Skin: Negative.   Neurological: Negative.  Negative for dizziness, focal weakness, seizures and headaches.  Endo/Heme/Allergies: Negative.   Psychiatric/Behavioral:  Positive for  depression. Negative for hallucinations, memory loss, substance abuse and suicidal ideas. The patient is nervous/anxious and has insomnia.    Past Medical History:  Diagnosis Date   Anemia    Anxiety    Dysrhythmia    palpitations   GERD (gastroesophageal reflux disease)    Headache(784.0)    Hypertension    SVD (spontaneous vaginal delivery)    x 3    Past Surgical History:  Procedure Laterality Date   ABDOMINAL HYSTERECTOMY N/A 03/23/2013   Procedure: HYSTERECTOMY ABDOMINAL;  Surgeon: Lavonia Drafts, MD;  Location: [facility];   Service: Gynecology;  Laterality: N/A;   BILATERAL SALPINGECTOMY Bilateral 03/23/2013   Procedure: BILATERAL SALPINGECTOMY;  Surgeon: Lavonia Drafts, MD;  Location: [facility];  Service: Gynecology;  Laterality: Bilateral;   COLONOSCOPY  08/2013   Pyrtle - polyp   COLPOSCOPY     FOOT SURGERY     right 5th toe-bone removed n1997   WISDOM TOOTH EXTRACTION      Family History  Problem Relation Age of Onset   Arthritis Mother    Stroke Mother    Hypertension Mother    Atrial fibrillation Mother    Arthritis Father    Cancer Father        prostate   Stroke Father    Alcohol abuse Brother    Stroke Brother    Cancer Paternal Aunt        breast and uterine   Colon cancer Neg Hx    Rectal cancer Neg Hx    Stomach cancer Neg Hx    Colon polyps Neg Hx    Esophageal cancer Neg Hx     Social History Reviewed with no changes to be made today.   Outpatient Medications Prior to Visit  Medication Sig Dispense Refill   aspirin EC 81 MG tablet Take 81 mg by mouth daily. Swallow whole.     diclofenac Sodium (VOLTAREN) 1 % GEL Apply 2 g topically 4 (four) times daily. 100 g 0   Multiple Vitamin (MULTIVITAMIN PO) Take 1 tablet by mouth daily.     lisinopril-hydrochlorothiazide (ZESTORETIC) 10-12.5 MG tablet TAKE 1 TABLET BY MOUTH DAILY. 90 tablet 1   No facility-administered medications prior to visit.    No Known Allergies     Objective:    BP 116/74   Pulse 84   Ht '5\' 4"'$  (1.626 m)   Wt 227 lb 6 oz (103.1 kg)   LMP  (LMP Unknown)   SpO2 99%   BMI 39.03 kg/m  Wt Readings from Last 3 Encounters:  01/25/21 227 lb 6 oz (103.1 kg)  01/24/21 223 lb (101.2 kg)  07/06/20 232 lb (105.2 kg)    Physical Exam Vitals and nursing note reviewed.  Constitutional:      Appearance: She is well-developed.  HENT:     Head: Normocephalic and atraumatic.     Right Ear: Hearing, tympanic membrane, ear canal and external ear normal.     Left Ear: Hearing, tympanic membrane, ear canal  and external ear normal.     Nose: Nose normal.     Right Turbinates: Not enlarged, swollen or pale.     Left Turbinates: Not enlarged, swollen or pale.     Mouth/Throat:     Lips: Pink.     Mouth: Mucous membranes are moist.     Dentition: No dental tenderness, gingival swelling, dental caries, dental abscesses or gum lesions.  Eyes:     General: Lids are normal.  Right eye: No foreign body, discharge or hordeolum.        Left eye: No foreign body, discharge or hordeolum.     Extraocular Movements: Extraocular movements intact.  Neck:     Thyroid: No thyroid mass.  Cardiovascular:     Rate and Rhythm: Normal rate and regular rhythm.     Heart sounds: Normal heart sounds. No murmur heard.   No friction rub. No gallop.  Pulmonary:     Effort: Pulmonary effort is normal. No tachypnea or respiratory distress.     Breath sounds: Normal breath sounds. No decreased breath sounds, wheezing, rhonchi or rales.  Chest:     Chest wall: No tenderness.  Abdominal:     General: Bowel sounds are normal.     Palpations: Abdomen is soft.  Musculoskeletal:        General: Normal range of motion.     Cervical back: Normal range of motion.  Skin:    General: Skin is warm and dry.  Neurological:     Mental Status: She is alert and oriented to person, place, and time.     Coordination: Coordination normal.     Deep Tendon Reflexes:     Reflex Scores:      Patellar reflexes are 1+ on the right side and 1+ on the left side. Psychiatric:        Behavior: Behavior normal. Behavior is cooperative.        Thought Content: Thought content normal.        Judgment: Judgment normal.         Patient has been counseled extensively about nutrition and exercise as well as the importance of adherence with medications and regular follow-up. The patient was given clear instructions to go to ER or return to medical center if symptoms don't improve, worsen or new problems develop. The patient verbalized  understanding.   Follow-up: Return for 4 weeks any tuesday in october. See me back in 3 months.   Gildardo Pounds, FNP-BC [facility] and [facility] [place], [place]   01/25/2021, 3:47 PM

## 2021-01-29 ENCOUNTER — Other Ambulatory Visit: Payer: Self-pay

## 2021-01-29 ENCOUNTER — Telehealth: Payer: Self-pay | Admitting: Nurse Practitioner

## 2021-01-29 NOTE — Telephone Encounter (Signed)
Pt dropped off documents for PCP for [place]

## 2021-02-01 NOTE — Telephone Encounter (Signed)
The paperwork takes 7-10 days to be filled out.

## 2021-02-01 NOTE — Telephone Encounter (Signed)
Will forward to nurse 

## 2021-02-01 NOTE — Telephone Encounter (Signed)
Pt wanted an update on her paperwork, please advise.

## 2021-02-06 NOTE — Telephone Encounter (Signed)
Paper work takes 7-10 business day to be filled out. Will let Pt know when it is ready.

## 2021-02-06 NOTE — Telephone Encounter (Signed)
Copied from [place] [id number]. Topic: General - Other >> Feb 04, 2021  3:43 PM Leward Quan A wrote: Reason for CRM: Patient called in to inquire if Geryl Rankins was able to complete the [facility] paperwork and returned it to [place]. Please call patient at   Ph# [phone]

## 2021-02-08 ENCOUNTER — Encounter: Payer: Self-pay | Admitting: Nurse Practitioner

## 2021-02-11 ENCOUNTER — Other Ambulatory Visit: Payer: Self-pay | Admitting: Nurse Practitioner

## 2021-02-11 NOTE — Progress Notes (Signed)
LVM requesting the following information:  Return to work date  Current Job Role or function

## 2021-02-22 ENCOUNTER — Other Ambulatory Visit: Payer: Self-pay

## 2021-02-22 ENCOUNTER — Ambulatory Visit
Admission: RE | Admit: 2021-02-22 | Discharge: 2021-02-22 | Disposition: A | Discharge status: Home / Self Care | Payer: No Typology Code available for payment source | Source: Ambulatory Visit

## 2021-02-22 DIAGNOSIS — Z1231 Encounter for screening mammogram for malignant neoplasm of breast: Secondary | ICD-10-CM

## 2021-02-25 ENCOUNTER — Encounter: Payer: Self-pay | Admitting: Nurse Practitioner

## 2021-02-25 ENCOUNTER — Other Ambulatory Visit: Payer: Self-pay

## 2021-02-26 ENCOUNTER — Encounter: Payer: Self-pay | Admitting: Nurse Practitioner

## 2021-02-26 ENCOUNTER — Other Ambulatory Visit: Payer: Self-pay

## 2021-02-26 ENCOUNTER — Ambulatory Visit: Payer: No Typology Code available for payment source | Attending: Nurse Practitioner | Admitting: Nurse Practitioner

## 2021-02-26 DIAGNOSIS — F419 Anxiety disorder, unspecified: Secondary | ICD-10-CM

## 2021-02-26 DIAGNOSIS — F32A Depression, unspecified: Secondary | ICD-10-CM

## 2021-02-26 NOTE — Progress Notes (Signed)
Virtual Visit via Telephone Note Due to national recommendations of social distancing due to [place] 19, telehealth visit is felt to be most appropriate for this patient at this time.  I discussed the limitations, risks, security and privacy concerns of performing an evaluation and management service by telephone and the availability of in person appointments. I also discussed with the patient that there may be a patient responsible charge related to this service. The patient expressed understanding and agreed to proceed.    I connected with Ann [name] on 02/26/21  at   9:30 AM EDT  EDT by telephone and verified that I am speaking with the correct person using two identifiers.  Location of Patient: Private Residence   Location of Provider: [place] [facility] [employer] Office    Persons participating in Telemedicine visit: Geryl Rankins FNP-BC Ann [name]    History of Present Illness: Telemedicine visit for: Anxiety and Depression  She was started on hydroxyzine 10 to 20 mg 3 times daily as needed and citalopram 10 mg daily on 01-25-21 for anxiety and depression. She is also currently seeing a licensed therapist and I submitted FMLA paperwork for her to be out of work for 30 days due to mood disorder. PHQ 9 score has improved today. Will continue current medication.  She does endorse drowsiness while taking the hydroxyzine and I have instructed her to try half a tablet instead.  She can take a whole tablet at night if having difficulty sleeping. Depression screen [facility] 2/9 02/26/2021 01/25/2021 10/21/2019 02/22/2019 08/05/2017  Decreased Interest 0 1 1 1 1   Down, Depressed, Hopeless 2 3 1 2 2   PHQ - 2 Score 2 4 2 3 3   Altered sleeping 2 3 2 2 2   Tired, decreased energy 1 2 1 1 2   Change in appetite 2 3 2 2 2   Feeling bad or failure about yourself  2 1 1 2 2   Trouble concentrating 0 1 0 1 1  Moving slowly or fidgety/restless 0 0 0 0 0  Suicidal thoughts 0 0 0 0 0  PHQ-9 Score 9  14 8 11 12   Difficult doing work/chores Very difficult Not difficult at all - - -  Some recent data might be hidden      Past Medical History:  Diagnosis Date   Anemia    Anxiety    Dysrhythmia    palpitations   GERD (gastroesophageal reflux disease)    Headache(784.0)    Hypertension    SVD (spontaneous vaginal delivery)    x 3    Past Surgical History:  Procedure Laterality Date   ABDOMINAL HYSTERECTOMY N/A 03/23/2013   Procedure: HYSTERECTOMY ABDOMINAL;  Surgeon: Lavonia Drafts, MD;  Location: [facility];  Service: Gynecology;  Laterality: N/A;   BILATERAL SALPINGECTOMY Bilateral 03/23/2013   Procedure: BILATERAL SALPINGECTOMY;  Surgeon: Lavonia Drafts, MD;  Location: [facility];  Service: Gynecology;  Laterality: Bilateral;   COLONOSCOPY  08/2013   Pyrtle - polyp   COLPOSCOPY     FOOT SURGERY     right 5th toe-bone removed n1997   WISDOM TOOTH EXTRACTION      Family History  Problem Relation Age of Onset   Arthritis Mother    Stroke Mother    Hypertension Mother    Atrial fibrillation Mother    Arthritis Father    Cancer Father        prostate   Stroke Father    Alcohol abuse Brother    Stroke Brother  Cancer Paternal Aunt        breast and uterine   Colon cancer Neg Hx    Rectal cancer Neg Hx    Stomach cancer Neg Hx    Colon polyps Neg Hx    Esophageal cancer Neg Hx     Social History   Socioeconomic History   Marital status: Single    Spouse name: Not on file   Number of children: Not on file   Years of education: Not on file   Highest education level: Not on file  Occupational History   Not on file  Tobacco Use   Smoking status: Never   Smokeless tobacco: Never  Vaping Use   Vaping Use: Never used  Substance and Sexual Activity   Alcohol use: Yes    Alcohol/week: 7.0 standard drinks    Types: 7 Glasses of wine per week   Drug use: No   Sexual activity: Yes    Birth control/protection: Surgical    Comment: HYSTERECTOMY  Other  Topics Concern   Not on file  Social History Narrative   Not on file   Social Determinants of Health   Financial Resource Strain: Not on file  Food Insecurity: Not on file  Transportation Needs: Not on file  Physical Activity: Not on file  Stress: Not on file  Social Connections: Not on file     Observations/Objective: Awake, alert and oriented x 3   Review of Systems  Constitutional:  Negative for fever, malaise/fatigue and weight loss.  HENT: Negative.  Negative for nosebleeds.   Eyes: Negative.  Negative for blurred vision, double vision and photophobia.  Respiratory: Negative.  Negative for cough and shortness of breath.   Cardiovascular: Negative.  Negative for chest pain, palpitations and leg swelling.  Gastrointestinal: Negative.  Negative for heartburn, nausea and vomiting.  Musculoskeletal: Negative.  Negative for myalgias.  Neurological: Negative.  Negative for dizziness, focal weakness, seizures and headaches.  Psychiatric/Behavioral:  Positive for depression. Negative for suicidal ideas. The patient is nervous/anxious.    Assessment and Plan: Diagnoses and all orders for this visit:  Anxiety and depression Continue citalopram and hydroxyzine as prescribed.  Follow Up Instructions Return in about 4 weeks (around 03/26/2021) for phq9/gad7.     I discussed the assessment and treatment plan with the patient. The patient was provided an opportunity to ask questions and all were answered. The patient agreed with the plan and demonstrated an understanding of the instructions.   The patient was advised to call back or seek an in-person evaluation if the symptoms worsen or if the condition fails to improve as anticipated.  I provided 15 minutes of non-face-to-face time during this encounter including median intraservice time, reviewing previous notes, labs, imaging, medications and explaining diagnosis and management.  Gildardo Pounds, FNP-BC

## 2021-02-27 ENCOUNTER — Other Ambulatory Visit: Payer: Self-pay

## 2021-03-26 ENCOUNTER — Encounter: Payer: Self-pay | Admitting: Nurse Practitioner

## 2021-03-26 ENCOUNTER — Telehealth (HOSPITAL_BASED_OUTPATIENT_CLINIC_OR_DEPARTMENT_OTHER): Payer: No Typology Code available for payment source | Admitting: Nurse Practitioner

## 2021-03-26 ENCOUNTER — Other Ambulatory Visit: Payer: Self-pay

## 2021-03-26 DIAGNOSIS — I10 Essential (primary) hypertension: Secondary | ICD-10-CM | POA: Diagnosis not present

## 2021-03-26 DIAGNOSIS — F419 Anxiety disorder, unspecified: Secondary | ICD-10-CM | POA: Diagnosis not present

## 2021-03-26 DIAGNOSIS — F32A Depression, unspecified: Secondary | ICD-10-CM

## 2021-03-26 MED ORDER — LISINOPRIL-HYDROCHLOROTHIAZIDE 10-12.5 MG PO TABS
1.0000 | ORAL_TABLET | Freq: Every day | ORAL | 1 refills | Status: DC
Start: 2021-03-26 — End: 2021-09-11
  Filled 2021-03-26: qty 30, 30d supply, fill #0
  Filled 2021-04-26: qty 30, 30d supply, fill #1
  Filled 2021-05-28: qty 30, 30d supply, fill #0
  Filled 2021-05-28: qty 30, 30d supply, fill #2
  Filled 2021-06-27: qty 30, 30d supply, fill #1
  Filled 2021-07-25: qty 30, 30d supply, fill #2
  Filled 2021-08-26: qty 30, 30d supply, fill #3

## 2021-03-26 MED ORDER — CITALOPRAM HYDROBROMIDE 10 MG PO TABS
10.0000 mg | ORAL_TABLET | Freq: Every day | ORAL | 3 refills | Status: DC
  Filled 2021-03-26: qty 30, 30d supply, fill #0
  Filled 2021-05-06: qty 30, 30d supply, fill #1
  Filled 2021-06-12: qty 30, 30d supply, fill #0
  Filled 2021-06-12: qty 30, 30d supply, fill #2
  Filled 2021-07-14: qty 30, 30d supply, fill #1
  Filled 2021-08-17: qty 30, 30d supply, fill #2
  Filled 2021-09-23: qty 30, 30d supply, fill #3
  Filled 2021-10-19: qty 30, 30d supply, fill #4
  Filled 2021-11-20: qty 30, 30d supply, fill #5
  Filled 2021-12-22: qty 30, 30d supply, fill #6
  Filled 2022-01-21: qty 30, 30d supply, fill #7

## 2021-03-26 NOTE — Progress Notes (Signed)
Virtual Visit via Telephone Note Due to national recommendations of social distancing due to Ann [name] 19, telehealth visit is felt to be most appropriate for this patient at this time.  I discussed the limitations, risks, security and privacy concerns of performing an evaluation and management service by telephone and the availability of in person appointments. I also discussed with the patient that there may be a patient responsible charge related to this service. The patient expressed understanding and agreed to proceed.    I connected with Ann [name] on 03/26/21  at   3:10 PM EST  EDT by telephone and verified that I am speaking with the correct person using two identifiers.  Location of Patient: Private Residence   Location of Provider: [place] [facility] [employer] Office    Persons participating in Telemedicine visit: Geryl Rankins FNP-BC Ann [name]    History of Present Illness: Telemedicine visit for: Anxiety and Depression She is currently seeing a psychologist. States last session will end this month. Scores for PHQ9 and GAD7 have improved. She is currently taking celexa 10 mg. States hydroxyzine 10 mg causes extreme drowsiness. I have recommended she take half a tablet instead during the day and one whole tablet at night to help her sleep  Depression screen [facility] 2/9 03/26/2021 02/26/2021 01/25/2021 10/21/2019 02/22/2019  Decreased Interest 1 0 1 1 1   Down, Depressed, Hopeless 1 2 3 1 2   PHQ - 2 Score 2 2 4 2 3   Altered sleeping 2 2 3 2 2   Tired, decreased energy 1 1 2 1 1   Change in appetite 2 2 3 2 2   Feeling bad or failure about yourself  1 2 1 1 2   Trouble concentrating 0 0 1 0 1  Moving slowly or fidgety/restless 1 0 0 0 0  Suicidal thoughts 0 0 0 0 0  PHQ-9 Score 9 9 14 8 11   Difficult doing work/chores Somewhat difficult Very difficult Not difficult at all - -  Some recent data might be hidden     GAD 7 : Generalized Anxiety Score 03/26/2021 01/25/2021  10/21/2019 02/22/2019  Nervous, Anxious, on Edge 1 3 1 1   Control/stop worrying 1 3 1 1   Worry too much - different things 2 3 2 1   Trouble relaxing 0 3 1 1   Restless 0 1 0 0  Easily annoyed or irritable 0 2 2 1   Afraid - awful might happen 0 2 1 0  Total GAD 7 Score 4 17 8 5   Anxiety Difficulty Not difficult at all Not difficult at all - -       Past Medical History:  Diagnosis Date   Anemia    Anxiety    Dysrhythmia    palpitations   GERD (gastroesophageal reflux disease)    Headache(784.0)    Hypertension    SVD (spontaneous vaginal delivery)    x 3    Past Surgical History:  Procedure Laterality Date   ABDOMINAL HYSTERECTOMY N/A 03/23/2013   Procedure: HYSTERECTOMY ABDOMINAL;  Surgeon: Lavonia Drafts, MD;  Location: [facility];  Service: Gynecology;  Laterality: N/A;   BILATERAL SALPINGECTOMY Bilateral 03/23/2013   Procedure: BILATERAL SALPINGECTOMY;  Surgeon: Lavonia Drafts, MD;  Location: [facility];  Service: Gynecology;  Laterality: Bilateral;   COLONOSCOPY  08/2013   Pyrtle - polyp   COLPOSCOPY     FOOT SURGERY     right 5th toe-bone removed n1997   WISDOM TOOTH EXTRACTION      Family History  Problem  Relation Age of Onset   Arthritis Mother    Stroke Mother    Hypertension Mother    Atrial fibrillation Mother    Arthritis Father    Cancer Father        prostate   Stroke Father    Alcohol abuse Brother    Stroke Brother    Cancer Paternal Aunt        breast and uterine   Colon cancer Neg Hx    Rectal cancer Neg Hx    Stomach cancer Neg Hx    Colon polyps Neg Hx    Esophageal cancer Neg Hx     Social History   Socioeconomic History   Marital status: Single    Spouse name: Not on file   Number of children: Not on file   Years of education: Not on file   Highest education level: Not on file  Occupational History   Not on file  Tobacco Use   Smoking status: Never   Smokeless tobacco: Never  Vaping Use   Vaping Use: Never used   Substance and Sexual Activity   Alcohol use: Yes    Alcohol/week: 7.0 standard drinks    Types: 7 Glasses of wine per week   Drug use: No   Sexual activity: Yes    Birth control/protection: Surgical    Comment: HYSTERECTOMY  Other Topics Concern   Not on file  Social History Narrative   Not on file   Social Determinants of Health   Financial Resource Strain: Not on file  Food Insecurity: Not on file  Transportation Needs: Not on file  Physical Activity: Not on file  Stress: Not on file  Social Connections: Not on file     Observations/Objective: Awake, alert and oriented x 3   Review of Systems  Constitutional:  Negative for fever, malaise/fatigue and weight loss.  HENT: Negative.  Negative for nosebleeds.   Eyes: Negative.  Negative for blurred vision, double vision and photophobia.  Respiratory: Negative.  Negative for cough and shortness of breath.   Cardiovascular: Negative.  Negative for chest pain, palpitations and leg swelling.  Gastrointestinal: Negative.  Negative for heartburn, nausea and vomiting.  Musculoskeletal: Negative.  Negative for myalgias.  Neurological: Negative.  Negative for dizziness, focal weakness, seizures and headaches.  Psychiatric/Behavioral:  Positive for depression. Negative for suicidal ideas. The patient is nervous/anxious.    Assessment and Plan: Diagnoses and all orders for this visit:  Anxiety and depression -     citalopram (CELEXA) 10 MG tablet; Take 1 tablet (10 mg total) by mouth daily.  Essential hypertension -     lisinopril-hydrochlorothiazide (ZESTORETIC) 10-12.5 MG tablet; TAKE 1 TABLET BY MOUTH DAILY. WELL CONTROLLED BP Readings from Last 3 Encounters:  01/25/21 116/74  01/24/21 119/73  07/06/20 109/73       Follow Up Instructions Return if symptoms worsen or fail to improve.     I discussed the assessment and treatment plan with the patient. The patient was provided an opportunity to ask questions and all were  answered. The patient agreed with the plan and demonstrated an understanding of the instructions.   The patient was advised to call back or seek an in-person evaluation if the symptoms worsen or if the condition fails to improve as anticipated.  I provided 10 minutes of non-face-to-face time during this encounter including median intraservice time, reviewing previous notes, labs, imaging, medications and explaining diagnosis and management.  Gildardo Pounds, FNP-BC

## 2021-04-01 ENCOUNTER — Encounter: Payer: Self-pay | Admitting: Nurse Practitioner

## 2021-04-01 ENCOUNTER — Telehealth: Payer: Self-pay | Admitting: Nurse Practitioner

## 2021-04-01 ENCOUNTER — Other Ambulatory Visit: Payer: Self-pay

## 2021-04-01 NOTE — Telephone Encounter (Signed)
Patient dropped off FMLA forms for Ann [name]. They have been placed in PCP box

## 2021-04-02 NOTE — Telephone Encounter (Signed)
Pt came to office to drop off Disability and Leave Papers to be filled out by PCP. 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 Placed in [employer]

## 2021-04-03 NOTE — Telephone Encounter (Signed)
Will call when paperwork is ready for pickup.

## 2021-04-19 NOTE — Telephone Encounter (Signed)
Faxed on 04/19/2021

## 2021-04-23 NOTE — Telephone Encounter (Signed)
Spoke to pt and let her know the forms was faxed on 12/9

## 2021-04-26 ENCOUNTER — Other Ambulatory Visit: Payer: Self-pay

## 2021-04-29 ENCOUNTER — Ambulatory Visit: Payer: No Typology Code available for payment source | Admitting: Nurse Practitioner

## 2021-05-01 ENCOUNTER — Other Ambulatory Visit: Payer: Self-pay

## 2021-05-07 ENCOUNTER — Other Ambulatory Visit: Payer: Self-pay

## 2021-05-08 ENCOUNTER — Other Ambulatory Visit: Payer: Self-pay

## 2021-05-21 ENCOUNTER — Telehealth: Payer: Self-pay | Admitting: Nurse Practitioner

## 2021-05-21 NOTE — Telephone Encounter (Signed)
Copied from [place] [phone]. Topic: General - Other >> May 17, 2021  4:22 PM Yvette Rack wrote: Reason for CRM: Hildred Alamin called on behalf of Dr. Legrand Como Rater. Estill Bamberg stated pt has disability claim and she was calling to see if  Geryl Rankins will do a peer to peer call. Cb# [id number]

## 2021-05-21 NOTE — Telephone Encounter (Signed)
Estill Bamberg called and stated that Bebe Liter is asking Dr. Huey Romans Rater to do a peer to peer review with Zelda for pts time out of work for disability claim/ Dr. Conchita Paris is reviewing it with Bebe Liter and wants to discuss how pts phychiatric issues is affecting her ability to work / please advise and call Estill Bamberg

## 2021-05-22 NOTE — Telephone Encounter (Signed)
Patient states she has resigned from job.

## 2021-05-28 ENCOUNTER — Other Ambulatory Visit: Payer: Self-pay

## 2021-05-31 ENCOUNTER — Other Ambulatory Visit: Payer: Self-pay

## 2021-06-13 ENCOUNTER — Other Ambulatory Visit: Payer: Self-pay

## 2021-06-18 ENCOUNTER — Other Ambulatory Visit: Payer: Self-pay

## 2021-06-27 ENCOUNTER — Other Ambulatory Visit: Payer: Self-pay

## 2021-06-28 ENCOUNTER — Other Ambulatory Visit: Payer: Self-pay

## 2021-07-15 ENCOUNTER — Other Ambulatory Visit: Payer: Self-pay

## 2021-07-19 ENCOUNTER — Other Ambulatory Visit: Payer: Self-pay

## 2021-07-26 ENCOUNTER — Other Ambulatory Visit: Payer: Self-pay

## 2021-07-30 ENCOUNTER — Other Ambulatory Visit: Payer: Self-pay

## 2021-08-08 ENCOUNTER — Other Ambulatory Visit: Payer: Self-pay

## 2021-08-13 ENCOUNTER — Other Ambulatory Visit: Payer: Self-pay

## 2021-08-19 ENCOUNTER — Other Ambulatory Visit: Payer: Self-pay

## 2021-08-21 ENCOUNTER — Other Ambulatory Visit: Payer: Self-pay

## 2021-08-26 ENCOUNTER — Other Ambulatory Visit: Payer: Self-pay

## 2021-08-27 ENCOUNTER — Other Ambulatory Visit: Payer: Self-pay

## 2021-09-11 ENCOUNTER — Other Ambulatory Visit: Payer: Self-pay

## 2021-09-11 ENCOUNTER — Encounter: Payer: Self-pay | Admitting: Physician Assistant

## 2021-09-11 ENCOUNTER — Ambulatory Visit: Payer: No Typology Code available for payment source | Attending: Physician Assistant | Admitting: Physician Assistant

## 2021-09-11 VITALS — BP 126/85 | HR 78 | Resp 16 | Ht 62.0 in | Wt 238.0 lb

## 2021-09-11 DIAGNOSIS — I10 Essential (primary) hypertension: Secondary | ICD-10-CM | POA: Diagnosis not present

## 2021-09-11 DIAGNOSIS — M654 Radial styloid tenosynovitis [de Quervain]: Secondary | ICD-10-CM

## 2021-09-11 DIAGNOSIS — F419 Anxiety disorder, unspecified: Secondary | ICD-10-CM | POA: Diagnosis not present

## 2021-09-11 DIAGNOSIS — M25569 Pain in unspecified knee: Secondary | ICD-10-CM

## 2021-09-11 DIAGNOSIS — G8929 Other chronic pain: Secondary | ICD-10-CM

## 2021-09-11 DIAGNOSIS — R69 Illness, unspecified: Secondary | ICD-10-CM | POA: Diagnosis not present

## 2021-09-11 DIAGNOSIS — F32A Depression, unspecified: Secondary | ICD-10-CM

## 2021-09-11 DIAGNOSIS — M25512 Pain in left shoulder: Secondary | ICD-10-CM

## 2021-09-11 DIAGNOSIS — M79645 Pain in left finger(s): Secondary | ICD-10-CM | POA: Diagnosis not present

## 2021-09-11 MED ORDER — HYDROXYZINE HCL 10 MG PO TABS
10.0000 mg | ORAL_TABLET | Freq: Three times a day (TID) | ORAL | 1 refills | Status: DC | PRN
  Filled 2021-09-11 – 2021-09-23 (×2): qty 60, 10d supply, fill #0
  Filled 2021-10-19: qty 60, 10d supply, fill #1

## 2021-09-11 MED ORDER — LISINOPRIL-HYDROCHLOROTHIAZIDE 10-12.5 MG PO TABS
1.0000 | ORAL_TABLET | Freq: Every day | ORAL | 1 refills | Status: DC
Start: 2021-09-11 — End: 2022-02-06
  Filled 2021-09-11: qty 90, 90d supply, fill #0
  Filled 2021-09-23: qty 30, 30d supply, fill #0
  Filled 2021-10-19: qty 30, 30d supply, fill #1
  Filled 2021-11-20: qty 30, 30d supply, fill #2
  Filled 2021-12-22: qty 30, 30d supply, fill #3
  Filled 2022-01-21: qty 30, 30d supply, fill #4

## 2021-09-11 MED ORDER — DICLOFENAC SODIUM 1 % EX GEL
2.0000 g | Freq: Four times a day (QID) | CUTANEOUS | 0 refills | Status: DC
  Filled 2021-09-11 – 2021-09-23 (×2): qty 100, 12d supply, fill #0

## 2021-09-11 MED ORDER — MELOXICAM 7.5 MG PO TABS
7.5000 mg | ORAL_TABLET | Freq: Every day | ORAL | 2 refills | Status: DC
  Filled 2021-09-11 – 2021-09-23 (×2): qty 30, 30d supply, fill #0
  Filled 2021-10-19: qty 30, 30d supply, fill #1
  Filled 2021-11-20: qty 30, 30d supply, fill #2

## 2021-09-11 NOTE — Patient Instructions (Signed)
Trigger Finger ? ?Trigger finger, also called stenosing tenosynovitis,  is a condition that causes a finger to get stuck in a bent position. Each finger has a tendon, which is a tough, cord-like tissue that connects muscle to bone, and each tendon passes through a tunnel of tissue called a tendon sheath. ?To move your finger, your tendon needs to glide freely through the sheath. Trigger finger happens when the tendon or the sheath thickens, making it difficult to move your finger. Trigger finger can affect any finger or a thumb. It may affect more than one finger. Mild cases may clear up with rest and medicine. Severe cases require more treatment. ?What are the causes? ?Trigger finger is caused by a thickened finger tendon or tendon sheath. The cause of this thickening is not known. ?What increases the risk? ?The following factors may make you more likely to develop this condition: ?Doing activities that require a strong grip. ?Having rheumatoid arthritis, gout, or diabetes. ?Being 81-60 years old. ?Being female. ?What are the signs or symptoms? ?Symptoms of this condition include: ?Pain when bending or straightening your finger. ?Tenderness or swelling where your finger attaches to the palm of your hand. ?A lump in the palm of your hand or on the inside of your finger. ?Hearing a noise like a pop or a snap when you try to straighten your finger. ?Feeling a catching or locking sensation when you try to straighten your finger. ?Being unable to straighten your finger. ?How is this diagnosed? ?This condition is diagnosed based on your symptoms and a physical exam. ?How is this treated? ?This condition may be treated by: ?Resting your finger and avoiding activities that make symptoms worse. ?Wearing a finger splint to keep your finger extended. ?Taking NSAIDs, such as ibuprofen, to relieve pain and swelling. ?Doing gentle exercises to stretch the finger as told by your health care provider. ?Having medicine that reduces  swelling and inflammation (steroids) injected into the tendon sheath. Injections may need to be repeated. ?Having surgery to open the tendon sheath. This may be done if other treatments do not work and you cannot straighten your finger. You may need physical therapy after surgery. ?Follow these instructions at home: ?If you have a splint: ?Wear the splint as told by your health care provider. Remove it only as told by your health care provider. ?Loosen it if your fingers tingle, become numb, or turn cold and blue. ?Keep it clean. ?If the splint is not waterproof: ?Do not let it get wet. ?Cover it with a watertight covering when you take a bath or shower. ?Managing pain, stiffness, and swelling ? ?  ? ?If directed, apply heat to the affected area as often as told by your health care provider. Use the heat source that your health care provider recommends, such as a moist heat pack or a heating pad. ?Place a towel between your skin and the heat source. ?Leave the heat on for 20-30 minutes. ?Remove the heat if your skin turns bright red. This is especially important if you are unable to feel pain, heat, or cold. You may have a greater risk of getting burned. ?If directed, put ice on the painful area. To do this: ?If you have a removable splint, remove it as told by your health care provider. ?Put ice in a plastic bag. ?Place a towel between your skin and the bag or between your splint and the bag. ?Leave the ice on for 20 minutes, 2-3 times a day. ? ?Activity ?Rest  your finger as told by your health care provider. Avoid activities that make the pain worse. ?Return to your normal activities as told by your health care provider. Ask your health care provider what activities are safe for you. ?Do exercises as told by your health care provider. ?Ask your health care provider when it is safe to drive if you have a splint on your hand. ?General instructions ?Take over-the-counter and prescription medicines only as told by  your health care provider. ?Keep all follow-up visits as told by your health care provider. This is important. ?Contact a health care provider if: ?Your symptoms are not improving with home care. ?Summary ?Trigger finger, also called stenosing tenosynovitis, causes your finger to get stuck in a bent position. This can make it difficult and painful to straighten your finger. ?This condition develops when a finger tendon or tendon sheath thickens. ?Treatment may include resting your finger, wearing a splint, and taking medicines. ?In severe cases, surgery to open the tendon sheath may be needed. ?This information is not intended to replace advice given to you by your health care provider. Make sure you discuss any questions you have with your health care provider. ?Document Revised: 09/13/2018 Document Reviewed: 09/13/2018 ?Elsevier Patient Education ? [place] Tenosynovitis ? ?De Quervain's tenosynovitis is a condition that causes inflammation of the tendon on the thumb side of the wrist. Tendons are cords of tissue that connect bones to muscles. The tendons in the hand pass through a tunnel called a sheath. A slippery layer of tissue (synovium) lets the tendons move smoothly in the sheath. With de Quervain's tenosynovitis, the sheath swells or thickens, causing friction and pain. ?The condition is also called de Quervain's disease and de Quervain's syndrome. It occurs most often in women who are 56-64 years old. ?What are the causes? ?The exact cause of this condition is not known. It may be associated with overuse of the hand and wrist. ?What increases the risk? ?You are more likely to develop this condition if you: ?Use your hands far more than normal, especially if you repeat certain movements that involve twisting your hand or using a tight grip. ?Are pregnant. ?Are a middle-aged woman. ?Have rheumatoid arthritis. ?Have diabetes. ?What are the signs or symptoms? ?The main symptom of this  condition is pain on the thumb side of the wrist. The pain may get worse when you grasp something or turn your wrist. Other symptoms may include: ?Pain that extends up the forearm. ?Swelling of your wrist and hand. ?Trouble moving the thumb and wrist. ?A sensation of snapping in the wrist. ?A bump filled with fluid (cyst) in the area of the pain. ?How is this diagnosed? ?This condition may be diagnosed based on: ?Your symptoms and medical history. ?A physical exam. During the exam, your health care provider may do a simple test W[place] test) that involves pulling your thumb and wrist to see if this causes pain. ?You may also need to have an X-ray or ultrasound. ?How is this treated? ?Treatment for this condition may include: ?Avoiding any activity that causes pain and swelling. ?Taking medicines. Anti-inflammatory medicines and corticosteroid injections may be used to reduce inflammation and relieve pain. ?Wearing a splint. ?Having surgery. This may be needed if other treatments do not work. ?Once the pain and swelling have gone down, you may start: ?Physical therapy. This includes exercises to improve movement and strength in your wrist and thumb. ?Occupational therapy. This includes adjusting how you move your wrist. ?  Follow these instructions at home: ?If you have a splint: ?Wear the splint as told by your health care provider. Remove it only as told by your health care provider. ?Loosen the splint if your fingers tingle, become numb, or turn cold and blue. ?Keep the splint clean. ?If the splint is not waterproof: ?Do not let it get wet. ?Cover it with a watertight covering when you take a bath or a shower. ?Managing pain, stiffness, and swelling ? ?Avoid movements and activities that cause pain and swelling in the wrist area. ?If directed, put ice on the painful area. This may be helpful after doing activities that involve the sore wrist. To do this: ?Put ice in a plastic bag. ?Place a towel between your  skin and the bag. ?Leave the ice on for 20 minutes, 2-3 times a day. ?Remove the ice if your skin turns bright red. This is very important. If you cannot feel pain, heat, or cold, you have a greater risk

## 2021-09-11 NOTE — Progress Notes (Signed)
Patient ID: Ann [name], female   DOB: 08/25/1960, 61 y.o.   MRN: [id number] ? ? ?Ann [name], is a 61 y.o. female ? ?[id number] ? ?[id number] ? ?DOB - 06/27/1960 ? ?Chief Complaint  ?Patient presents with  ? thumb pain  ?  Left sided  ? Shoulder Pain  ?  Right sided  ?    ? ?Subjective:  ? ?Ann [name] is a 61 y.o. female here today for L thumb pain. ? ?Onset:  months ago ?Location:  L thumb thenar eminence and clicking ?Duration:   months-worsening over time ?Characterization/quality:  worse in the morning ?Alleviating/aggravating: none ?Radiation: none ?Severity:  moderate ? ?Also wants referral to ortho for ongoing R shoulder pain and knee pain.  Prefers not taking meds.   ? ?Compliant on BP meds.  Needs RF ? ?No problems updated. ? ?ALLERGIES: ?No Known Allergies ? ?PAST MEDICAL HISTORY: ?Past Medical History:  ?Diagnosis Date  ? Anemia   ? Anxiety   ? Dysrhythmia   ? palpitations  ? GERD (gastroesophageal reflux disease)   ? Headache(784.0)   ? Hypertension   ? SVD (spontaneous vaginal delivery)   ? x 3  ? ? ?MEDICATIONS AT HOME: ?Prior to Admission medications   ?Medication Sig Start Date End Date Taking? Authorizing Provider  ?aspirin EC 81 MG tablet Take 81 mg by mouth daily. Swallow whole.   Yes [provider]  ?citalopram (CELEXA) 10 MG tablet Take 1 tablet (10 mg total) by mouth daily. 03/26/21 09/20/21 Yes Gildardo Pounds, NP  ?meloxicam (MOBIC) 7.5 MG tablet Take 1 tablet (7.5 mg total) by mouth daily. Prn pain 09/11/21  Yes Argentina Donovan, PA-C  ?Multiple Vitamin (MULTIVITAMIN PO) Take 1 tablet by mouth daily.   Yes [provider]  ?diclofenac Sodium (VOLTAREN) 1 % GEL Apply 2 g topically 4 (four) times daily. 09/11/21   Argentina Donovan, PA-C  ?hydrOXYzine (ATARAX) 10 MG tablet Take 1-2 tablets (10-20 mg total) by mouth 3 (three) times daily as needed. 09/11/21   Argentina Donovan, PA-C  ?lisinopril-hydrochlorothiazide (ZESTORETIC) 10-12.5 MG tablet TAKE 1 TABLET BY MOUTH  DAILY. 09/11/21 09/11/22  Argentina Donovan, PA-C  ? ? ?ROS: ?Neg HEENT ?Neg resp ?Neg cardiac ?Neg GI ?Neg GU ?Neg MS ?Neg psych ?Neg neuro ? ?Objective:  ? ?Vitals:  ? 09/11/21 0905  ?BP: 126/85  ?Pulse: 78  ?Resp: 16  ?SpO2: 99%  ?Weight: 238 lb (108 kg)  ?Height: '5\' 2"'$  (1.575 m)  ? ?Exam ?General appearance : Awake, alert, not in any distress. Speech Clear. Not toxic looking ?HEENT: Atraumatic and Normocephalic ?Neck: Supple, no JVD. No cervical lymphadenopathy.  ?Chest: Good air entry bilaterally, CTAB.  No rales/rhonchi/wheezing ?CVS: S1 S2 regular, no murmurs.  ?+finkelstein on L thumb, clicks and DIP ?Extremities: B/L Lower Ext shows no edema, both legs are warm to touch ?Neurology: Awake alert, and oriented X 3, CN II-XII intact, Non focal ?Skin: No Rash ? ?Data Review ?Lab Results  ?Component Value Date  ? HGBA1C 5.5 10/21/2019  ? HGBA1C 5.6 08/13/2016  ? HGBA1C 5.3 11/17/2012  ? ? ?Assessment & Plan  ? ?1. Chronic pain of left thumb ?Obtain spica splint-believe trigger of L DIP ?- diclofenac Sodium (VOLTAREN) 1 % GEL; Apply 2 g topically 4 (four) times daily.  Dispense: 100 g; Refill: 0 ?- meloxicam (MOBIC) 7.5 MG tablet; Take 1 tablet (7.5 mg total) by mouth daily. Prn pain  Dispense: 30 tablet; Refill: 2 ? ?2. Tenosynovitis, de  Quervain ?- diclofenac Sodium (VOLTAREN) 1 % GEL; Apply 2 g topically 4 (four) times daily.  Dispense: 100 g; Refill: 0 ?- meloxicam (MOBIC) 7.5 MG tablet; Take 1 tablet (7.5 mg total) by mouth daily. Prn pain  Dispense: 30 tablet; Refill: 2 ? ?3. Anxiety and depression ?Stable.  Does not need Rf on celexa ?- hydrOXYzine (ATARAX) 10 MG tablet; Take 1-2 tablets (10-20 mg total) by mouth 3 (three) times daily as needed.  Dispense: 60 tablet; Refill: 1 ? ?4. Essential hypertension ?Controlled-continue ?- lisinopril-hydrochlorothiazide (ZESTORETIC) 10-12.5 MG tablet; TAKE 1 TABLET BY MOUTH DAILY.  Dispense: 90 tablet; Refill: 1 ? ?5. Chronic left shoulder pain ?- diclofenac Sodium  (VOLTAREN) 1 % GEL; Apply 2 g topically 4 (four) times daily.  Dispense: 100 g; Refill: 0 ?- meloxicam (MOBIC) 7.5 MG tablet; Take 1 tablet (7.5 mg total) by mouth daily. Prn pain  Dispense: 30 tablet; Refill: 2 ? ?6. Knee pain, unspecified chronicity, unspecified laterality ?- diclofenac Sodium (VOLTAREN) 1 % GEL; Apply 2 g topically 4 (four) times daily.  Dispense: 100 g; Refill: 0 ?- meloxicam (MOBIC) 7.5 MG tablet; Take 1 tablet (7.5 mg total) by mouth daily. Prn pain  Dispense: 30 tablet; Refill: 2 ? ? ? ?Return in about 1 month (around 10/12/2021) for PCP for chronic conditions. ? ?The patient was given clear instructions to go to ER or return to medical center if symptoms don't improve, worsen or new problems develop. The patient verbalized understanding. The patient was told to call to get lab results if they haven't heard anything in the next week.  ? ? ? ? ?Freeman Caldron, PA-C ?[facility] ?[place], [place] ?[phone]   ?09/11/2021, 9:28 AM  ?

## 2021-09-18 ENCOUNTER — Ambulatory Visit: Payer: No Typology Code available for payment source | Admitting: Orthopaedic Surgery

## 2021-09-18 ENCOUNTER — Other Ambulatory Visit: Payer: Self-pay

## 2021-09-23 ENCOUNTER — Other Ambulatory Visit: Payer: Self-pay

## 2021-09-24 ENCOUNTER — Ambulatory Visit (INDEPENDENT_AMBULATORY_CARE_PROVIDER_SITE_OTHER): Payer: 59

## 2021-09-24 ENCOUNTER — Encounter: Payer: Self-pay | Admitting: Orthopaedic Surgery

## 2021-09-24 ENCOUNTER — Other Ambulatory Visit: Payer: Self-pay

## 2021-09-24 ENCOUNTER — Ambulatory Visit (INDEPENDENT_AMBULATORY_CARE_PROVIDER_SITE_OTHER): Payer: 59 | Admitting: Orthopaedic Surgery

## 2021-09-24 DIAGNOSIS — M25562 Pain in left knee: Secondary | ICD-10-CM

## 2021-09-24 DIAGNOSIS — M65312 Trigger thumb, left thumb: Secondary | ICD-10-CM | POA: Diagnosis not present

## 2021-09-24 DIAGNOSIS — G8929 Other chronic pain: Secondary | ICD-10-CM

## 2021-09-24 NOTE — Progress Notes (Signed)
? ?Office Visit Note ?  ?Patient: Ann [name]           ?Date of Birth: 1960-09-03           ?MRN: [id number] ?Visit Date: 09/24/2021 ?             ?Requested by: Argentina Donovan, PA-C ?[address] ?[address] ?[place],  [place] [id number] ?PCP: Gildardo Pounds, NP ? ? ?Assessment & Plan: ?Visit Diagnoses:  ?1. Chronic pain of left knee   ?2. Trigger thumb of left hand   ? ? 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  Treatment options were discussed for each condition and she would like to take the meloxicam that her PCP prescribed yesterday and go to outpatient PT.  She would like to hold off on injection for the trigger thumb for now.  Follow-up as needed. ? ?Follow-Up Instructions: No follow-ups on file.  ? ?Orders:  ?Orders Placed This Encounter  ?Procedures  ? XR Finger Thumb Left  ? XR KNEE 3 VIEW LEFT  ? Ambulatory referral to Physical Therapy  ? ?No orders of the defined types were placed in this encounter. ? ? ? ? Procedures: ?No procedures performed ? ? ?Clinical Data: ?No additional findings. ? ? ?Subjective: ?Chief Complaint  ?Patient presents with  ? Left Knee - Pain  ? Left Thumb - Pain  ? ? ?HPI ? ?[name] is a very pleasant 61 year old female here for evaluation of left thumb pain and left knee pain.  For the left thumb she has had pain for a month and locking clicking for 5 months.  She feels pain along the volar surface of the thumb and into the thenar compartment.  Denies any numbness and tingling.  For the left knee she has anterior knee pain that acutely was worsened after a fall around Thanksgiving.  She has pain mainly when climbing stairs.  Reports mild stiffness. ? ?Review of Systems  ?Constitutional: Negative.   ?HENT: Negative.    ?Eyes: Negative.   ?Respiratory: Negative.    ?Cardiovascular: Negative.   ?Endocrine: Negative.   ?Musculoskeletal: Negative.   ?Neurological: Negative.   ?Hematological: Negative.   ?Psychiatric/Behavioral: Negative.    ?All  other systems reviewed and are negative. ? ? ?Objective: ?Vital Signs: LMP  (LMP Unknown)  ? ?Physical Exam ?Vitals and nursing note reviewed.  ?Constitutional:   ?   Appearance: She is well-developed.  ?HENT:  ?   Head: Normocephalic and atraumatic.  ?Pulmonary:  ?   Effort: Pulmonary effort is normal.  ?Abdominal:  ?   Palpations: Abdomen is soft.  ?Musculoskeletal:  ?   Cervical back: Neck supple.  ?Skin: ?   General: Skin is warm.  ?   Capillary Refill: Capillary refill takes less than 2 seconds.  ?Neurological:  ?   Mental Status: She is alert and oriented to person, place, and time.  ?Psychiatric:     ?   Behavior: Behavior normal.     ?   Thought Content: Thought content normal.     ?   Judgment: Judgment normal.  ? ? ?Ortho Exam ? ?Examination of the left thumb shows tenderness of the A1 pulley with active triggering.  Negative Finkelstein's.  No pain with grind test.  Normal capillary refill.  No swelling or lesions. ?Exam ?Examination of the left knee shows no joint effusion.  Normal range of motion.  Collaterals and cruciates are stable.  No joint line tenderness.  Normal strength and sensation.  No significant crepitus. ? ? ?Specialty Comments:  ?No specialty comments available. ? ?Imaging: ?XR Finger Thumb Left ? ?Result Date: 09/24/2021 ?Mild thumb CMC arthritis ? ?XR KNEE 3 VIEW LEFT ? ?Result Date: 09/24/2021 ?Mild osteoarthritis.  ? ? ?PMFS History: ?Patient Active Problem List  ? Diagnosis Date Noted  ? Palpitations 02/06/2014  ? Anxiety state, unspecified 11/18/2013  ? Abdominal pain 08/19/2013  ? Atypical chest pain 02/17/2013  ? Essential hypertension, benign 12/24/2012  ? Neck muscle spasm 08/06/2012  ? Shoulder pain 08/06/2012  ? Headache(784.0) 07/03/2010  ? Chest pain 07/03/2010  ? Elevated BP 07/03/2010  ? ?Past Medical History:  ?Diagnosis Date  ? Anemia   ? Anxiety   ? Dysrhythmia   ? palpitations  ? GERD (gastroesophageal reflux disease)   ? Headache(784.0)   ? Hypertension   ? SVD  (spontaneous vaginal delivery)   ? x 3  ?  ?Family History  ?Problem Relation Age of Onset  ? Arthritis Mother   ? Stroke Mother   ? Hypertension Mother   ? Atrial fibrillation Mother   ? Arthritis Father   ? Cancer Father   ?     prostate  ? Stroke Father   ? Alcohol abuse Brother   ? Stroke Brother   ? Cancer Paternal Aunt   ?     breast and uterine  ? Colon cancer Neg Hx   ? Rectal cancer Neg Hx   ? Stomach cancer Neg Hx   ? Colon polyps Neg Hx   ? Esophageal cancer Neg Hx   ?  ?Past Surgical History:  ?Procedure Laterality Date  ? ABDOMINAL HYSTERECTOMY N/A 03/23/2013  ? Procedure: HYSTERECTOMY ABDOMINAL;  Surgeon: Lavonia Drafts, MD;  Location: [facility];  Service: Gynecology;  Laterality: N/A;  ? BILATERAL SALPINGECTOMY Bilateral 03/23/2013  ? Procedure: BILATERAL SALPINGECTOMY;  Surgeon: Lavonia Drafts, MD;  Location: [facility];  Service: Gynecology;  Laterality: Bilateral;  ? COLONOSCOPY  08/2013  ? Pyrtle - polyp  ? COLPOSCOPY    ? FOOT SURGERY    ? right 5th toe-bone removed n1997  ? WISDOM TOOTH EXTRACTION    ? ?Social History  ? ?Occupational History  ? Not on file  ?Tobacco Use  ? Smoking status: Never  ? Smokeless tobacco: Never  ?Vaping Use  ? Vaping Use: Never used  ?Substance and Sexual Activity  ? Alcohol use: Yes  ?  Alcohol/week: 7.0 standard drinks  ?  Types: 7 Glasses of wine per week  ? Drug use: No  ? Sexual activity: Yes  ?  Birth control/protection: Surgical  ?  Comment: HYSTERECTOMY  ? ? ? ? ? ? ?

## 2021-10-21 ENCOUNTER — Other Ambulatory Visit: Payer: Self-pay

## 2021-10-24 ENCOUNTER — Other Ambulatory Visit: Payer: Self-pay

## 2021-10-25 ENCOUNTER — Other Ambulatory Visit: Payer: Self-pay

## 2021-11-21 ENCOUNTER — Other Ambulatory Visit: Payer: Self-pay

## 2021-11-26 ENCOUNTER — Other Ambulatory Visit: Payer: Self-pay

## 2021-12-22 ENCOUNTER — Other Ambulatory Visit: Payer: Self-pay | Admitting: Physician Assistant

## 2021-12-22 DIAGNOSIS — G8929 Other chronic pain: Secondary | ICD-10-CM

## 2021-12-22 DIAGNOSIS — M25569 Pain in unspecified knee: Secondary | ICD-10-CM

## 2021-12-22 DIAGNOSIS — M654 Radial styloid tenosynovitis [de Quervain]: Secondary | ICD-10-CM

## 2021-12-23 ENCOUNTER — Other Ambulatory Visit: Payer: Self-pay

## 2021-12-23 MED ORDER — MELOXICAM 7.5 MG PO TABS
7.5000 mg | ORAL_TABLET | Freq: Every day | ORAL | 0 refills | Status: DC
  Filled 2021-12-23: qty 30, 30d supply, fill #0

## 2021-12-25 ENCOUNTER — Other Ambulatory Visit: Payer: Self-pay

## 2022-01-15 ENCOUNTER — Other Ambulatory Visit: Payer: Self-pay | Admitting: Nurse Practitioner

## 2022-01-15 DIAGNOSIS — M654 Radial styloid tenosynovitis [de Quervain]: Secondary | ICD-10-CM

## 2022-01-15 DIAGNOSIS — F419 Anxiety disorder, unspecified: Secondary | ICD-10-CM

## 2022-01-15 DIAGNOSIS — I10 Essential (primary) hypertension: Secondary | ICD-10-CM

## 2022-01-15 DIAGNOSIS — G8929 Other chronic pain: Secondary | ICD-10-CM

## 2022-01-15 DIAGNOSIS — M25569 Pain in unspecified knee: Secondary | ICD-10-CM

## 2022-01-15 NOTE — Telephone Encounter (Signed)
Patient called, left VM to return the call to the office to schedule an OV with PCP in order to receive refills in a timely manner. MyChart message sent to patient.

## 2022-01-15 NOTE — Telephone Encounter (Signed)
Medication Refill - Medication:  meloxicam (MOBIC) 7.5 MG tablet    Rx #: [id number]  citalopram (CELEXA) 10 MG tablet   Rx #: [id number]  lisinopril-hydrochlorothiazide (ZESTORETIC) 10-12.5 MG tablet   Rx #: [id number]  hydrOXYzine (ATARAX) 10 MG tab/ pt would like 90 supplies with refills   Has the patient contacted their pharmacy? Yes.   (Agent: If no, request that the patient contact the pharmacy for the refill. If patient does not wish to contact the pharmacy document the reason why and proceed with request.) (Agent: If yes, when and what did the pharmacy advise?)  Preferred Pharmacy (with phone number [facility] street [facility]   Has the patient been seen for an appointment in the last year OR does the patient have an upcoming appointment? Yes.    Agent: Please be advised that RX refills may take up to 3 business days. We ask that you follow-up with your pharmacy.

## 2022-01-16 MED ORDER — MELOXICAM 7.5 MG PO TABS: 7.5000 mg | ORAL_TABLET | Freq: Every day | ORAL | 0 refills | Status: DC

## 2022-01-16 MED ORDER — HYDROXYZINE HCL 10 MG PO TABS: 10.0000 mg | ORAL_TABLET | Freq: Three times a day (TID) | ORAL | 0 refills | Status: DC | PRN

## 2022-01-16 NOTE — Telephone Encounter (Signed)
Lisinopril-HCTZ- 09/11/21 #90 1RF- too soon Citalopram- 03/26/21 #90 3RF- too soon Requested Prescriptions  Pending Prescriptions Disp Refills  . meloxicam (MOBIC) 7.5 MG tablet 30 tablet 0    Sig: Take 1 tablet (7.5 mg total) by mouth daily as needed for pain.     Analgesics:  COX2 Inhibitors Failed - 01/15/2022 10:22 AM      Failed - Manual Review: Labs are only required if the patient has taken medication for more than 8 weeks.      Passed - HGB in normal range and within 360 days    Hemoglobin  Date Value Ref Range Status  01/24/2021 14.6 12.0 - 15.0 g/dL Final  10/21/2019 14.4 11.1 - 15.9 g/dL Final         Passed - Cr in normal range and within 360 days    Creat  Date Value Ref Range Status  06/23/2014 0.94 0.50 - 1.10 mg/dL Final   Creatinine, Ser  Date Value Ref Range Status  01/24/2021 1.00 0.44 - 1.00 mg/dL Final   Creatinine, POC  Date Value Ref Range Status  10/02/2016 200 mg/dL Final         Passed - HCT in normal range and within 360 days    HCT  Date Value Ref Range Status  01/24/2021 45.2 36.0 - 46.0 % Final   Hematocrit  Date Value Ref Range Status  10/21/2019 43.5 34.0 - 46.6 % Final         Passed - AST in normal range and within 360 days    AST  Date Value Ref Range Status  01/24/2021 22 15 - 41 U/L Final         Passed - ALT in normal range and within 360 days    ALT  Date Value Ref Range Status  01/24/2021 15 0 - 44 U/L Final         Passed - eGFR is 30 or above and within 360 days    GFR, Est African American  Date Value Ref Range Status  06/23/2014 81 mL/min Final   GFR calc Af Amer  Date Value Ref Range Status  07/06/2020 69 >59 mL/min/1.73 Final    Comment:    **In accordance with recommendations from the NKF-ASN Task force,**   [facility] is in the process of updating its eGFR calculation to the   2021 CKD-EPI creatinine equation that estimates kidney function   without a race variable.    GFR, Est Non African American  Date  Value Ref Range Status  06/23/2014 70 mL/min Final    Comment:      The estimated GFR is a calculation valid for adults (>=62 years old) that uses the CKD-EPI algorithm to adjust for age and sex. It is   not to be used for children, pregnant women, hospitalized patients,    patients on dialysis, or with rapidly changing kidney function. According to the NKDEP, eGFR >89 is normal, 60-89 shows mild impairment, 30-59 shows moderate impairment, 15-29 shows severe impairment and <15 is ESRD.      GFR, Estimated  Date Value Ref Range Status  01/24/2021 >60 >60 mL/min Final    Comment:    (NOTE) Calculated using the CKD-EPI Creatinine Equation (2021)          Passed - Patient is not pregnant      Passed - Valid encounter within last 12 months    Recent Outpatient Visits          4 months  ago Chronic pain of left thumb   [place], [place], [place]   9 months ago Anxiety and depression   [place], Vernia Buff, NP   10 months ago Anxiety and depression   [place], Vernia Buff, NP   11 months ago Encounter for annual physical exam   [place], Vernia Buff, NP   1 year ago Essential hypertension   [place], Vernia Buff, NP      Future Appointments            In 3 weeks Gildardo Pounds, NP [place]           . hydrOXYzine (ATARAX) 10 MG tablet 60 tablet 1    Sig: Take 1-2 tablets (10-20 mg total) by mouth 3 (three) times daily as needed.     Ear, Nose, and Throat:  Antihistamines 2 Passed - 01/15/2022 10:22 AM      Passed - Cr in normal range and within 360 days    Creat  Date Value Ref Range Status  06/23/2014 0.94 0.50 - 1.10 mg/dL Final   Creatinine, Ser  Date Value Ref Range Status  01/24/2021 1.00 0.44 - 1.00 mg/dL Final   Creatinine, POC  Date  Value Ref Range Status  10/02/2016 200 mg/dL Final         Passed - Valid encounter within last 12 months    Recent Outpatient Visits          4 months ago Chronic pain of left thumb   [place], [place], [place]   9 months ago Anxiety and depression   [place], Vernia Buff, NP   10 months ago Anxiety and depression   [place], Vernia Buff, NP   11 months ago Encounter for annual physical exam   [place], Vernia Buff, NP   1 year ago Essential hypertension   [place], Vernia Buff, NP      Future Appointments            In 3 weeks Gildardo Pounds, NP [place]  . lisinopril-hydrochlorothiazide (ZESTORETIC) 10-12.5 MG tablet 90 tablet 1    Sig: TAKE 1 TABLET BY MOUTH DAILY.     Cardiovascular:  ACEI + Diuretic Combos Failed - 01/15/2022 10:22 AM      Failed - Na in normal range and within 180 days    Sodium  Date Value Ref Range Status  01/24/2021 136 135 - 145 mmol/L Final  07/06/2020 139 134 - 144 mmol/L Final         Failed - K in normal range and within 180 days    Potassium  Date Value Ref Range Status  01/24/2021 3.7 3.5 - 5.1 mmol/L Final         Failed - Cr in normal range and within 180 days    Creat  Date Value Ref Range Status  06/23/2014 0.94 0.50 - 1.10 mg/dL Final   Creatinine, Ser  Date Value Ref Range Status  01/24/2021 1.00 0.44 - 1.00 mg/dL Final   Creatinine, POC  Date Value  Ref Range Status  10/02/2016 200 mg/dL Final         Failed - eGFR is 30 or above and within 180 days    GFR, Est African American  Date Value Ref Range Status  06/23/2014 81 mL/min Final   GFR calc Af Amer  Date Value Ref Range Status  07/06/2020 69 >59 mL/min/1.73 Final    Comment:    **In accordance  with recommendations from the NKF-ASN Task force,**   [facility] is in the process of updating its eGFR calculation to the   2021 CKD-EPI creatinine equation that estimates kidney function   without a race variable.    GFR, Est Non African American  Date Value Ref Range Status  06/23/2014 70 mL/min Final    Comment:      The estimated GFR is a calculation valid for adults (>=63 years old) that uses the CKD-EPI algorithm to adjust for age and sex. It is   not to be used for children, pregnant women, hospitalized patients,    patients on dialysis, or with rapidly changing kidney function. According to the NKDEP, eGFR >89 is normal, 60-89 shows mild impairment, 30-59 shows moderate impairment, 15-29 shows severe impairment and <15 is ESRD.      GFR, Estimated  Date Value Ref Range Status  01/24/2021 >60 >60 mL/min Final    Comment:    (NOTE) Calculated using the CKD-EPI Creatinine Equation (2021)          Passed - Patient is not pregnant      Passed - Last BP in normal range    BP Readings from Last 1 Encounters:  09/11/21 126/85         Passed - Valid encounter within last 6 months    Recent Outpatient Visits          4 months ago Chronic pain of left thumb   [place], [place], [place]   9 months ago Anxiety and depression   [place], Vernia Buff, NP   10 months ago Anxiety and depression   [place], Vernia Buff, NP   11 months ago Encounter for annual physical exam   [place], Vernia Buff, NP   1 year ago Essential hypertension   [place], Vernia Buff, NP      Future Appointments            In 3 weeks Gildardo Pounds, NP [place]           . citalopram (CELEXA) 10 MG tablet 90 tablet 3    Sig: Take 1 tablet (10 mg total) by mouth daily.      Psychiatry:  Antidepressants - SSRI Passed - 01/15/2022 10:22 AM      Passed - Valid encounter within last 6 months    Recent Outpatient Visits          4 months ago Chronic pain of left thumb   [place], [place], [place]   9 months ago Anxiety and depression   [place], Vernia Buff, NP   10 months ago Anxiety and depression   [place], NP   11 months ago Encounter for annual physical exam   [place]  Gildardo Pounds, NP   1 year ago Essential hypertension   [place], Zelda W, NP      Future Appointments            In 3 weeks Gildardo Pounds, NP [place]

## 2022-01-22 ENCOUNTER — Other Ambulatory Visit: Payer: Self-pay

## 2022-01-24 ENCOUNTER — Other Ambulatory Visit: Payer: Self-pay

## 2022-02-06 ENCOUNTER — Ambulatory Visit: Payer: Commercial Managed Care - HMO | Attending: Nurse Practitioner | Admitting: Nurse Practitioner

## 2022-02-06 ENCOUNTER — Encounter: Payer: Self-pay | Admitting: Nurse Practitioner

## 2022-02-06 VITALS — BP 127/83 | HR 76 | Temp 98.0°F | Ht 62.0 in | Wt 237.8 lb

## 2022-02-06 DIAGNOSIS — G8929 Other chronic pain: Secondary | ICD-10-CM

## 2022-02-06 DIAGNOSIS — M25561 Pain in right knee: Secondary | ICD-10-CM

## 2022-02-06 DIAGNOSIS — F419 Anxiety disorder, unspecified: Secondary | ICD-10-CM

## 2022-02-06 DIAGNOSIS — E785 Hyperlipidemia, unspecified: Secondary | ICD-10-CM

## 2022-02-06 DIAGNOSIS — M25562 Pain in left knee: Secondary | ICD-10-CM

## 2022-02-06 DIAGNOSIS — Z862 Personal history of diseases of the blood and blood-forming organs and certain disorders involving the immune mechanism: Secondary | ICD-10-CM

## 2022-02-06 DIAGNOSIS — Z23 Encounter for immunization: Secondary | ICD-10-CM

## 2022-02-06 DIAGNOSIS — I10 Essential (primary) hypertension: Secondary | ICD-10-CM | POA: Diagnosis not present

## 2022-02-06 DIAGNOSIS — F32A Depression, unspecified: Secondary | ICD-10-CM

## 2022-02-06 DIAGNOSIS — Z6841 Body Mass Index (BMI) 40.0 and over, adult: Secondary | ICD-10-CM

## 2022-02-06 MED ORDER — LISINOPRIL-HYDROCHLOROTHIAZIDE 10-12.5 MG PO TABS: 1.0000 | ORAL_TABLET | Freq: Every day | ORAL | 1 refills | Status: DC

## 2022-02-06 MED ORDER — MELOXICAM 7.5 MG PO TABS: 7.5000 mg | ORAL_TABLET | Freq: Every day | ORAL | 1 refills | Status: DC

## 2022-02-06 MED ORDER — CITALOPRAM HYDROBROMIDE 10 MG PO TABS: 10.0000 mg | ORAL_TABLET | Freq: Every day | ORAL | 3 refills | Status: DC

## 2022-02-06 MED ORDER — HYDROXYZINE HCL 10 MG PO TABS: 10.0000 mg | ORAL_TABLET | Freq: Three times a day (TID) | ORAL | 3 refills | Status: DC | PRN

## 2022-02-06 NOTE — Progress Notes (Signed)
Assessment & Plan:  Janylah was seen today for hypertension.  Diagnoses and all orders for this visit:  Essential hypertension -     lisinopril-hydrochlorothiazide (ZESTORETIC) 10-12.5 MG tablet; TAKE 1 TABLET BY MOUTH DAILY. -     CMP14+EGFR  Anxiety and depression -     hydrOXYzine (ATARAX) 10 MG tablet; Take 1-2 tablets (10-20 mg total) by mouth 3 (three) times daily as needed. -     citalopram (CELEXA) 10 MG tablet; Take 1 tablet (10 mg total) by mouth daily.  Chronic pain of both knees -     meloxicam (MOBIC) 7.5 MG tablet; Take 1 tablet (7.5 mg total) by mouth daily as needed for pain.  Dyslipidemia, goal LDL below 100 -     Lipid panel  History of anemia -     CBC with Differential    Patient has been counseled on age-appropriate routine health concerns for screening and prevention. These are reviewed and up-to-date. Referrals have been placed accordingly. Immunizations are up-to-date or declined.    Subjective:   Chief Complaint  Patient presents with   Hypertension   Hypertension Pertinent negatives include no blurred vision, chest pain, headaches, malaise/fatigue, palpitations or shortness of breath.   Ann [name] 61 y.o. female presents to office today for follow up to HTN.  He has a past medical history of Anemia, Anxiety, Dysrhythmia, GERD, Headache(784.0), Hypertension   Blood pressure is well controlled with zestoretic 10-12.5 mg daily. She does not have her blood pressure log with her today.  BP Readings from Last 3 Encounters:  02/06/22 127/83  09/11/21 126/85  01/25/21 116/74     Anxiety and Depression Well controlled with celexa and hydroxyzine.    Review of Systems  Constitutional:  Negative for fever, malaise/fatigue and weight loss.  HENT: Negative.  Negative for nosebleeds.   Eyes: Negative.  Negative for blurred vision, double vision and photophobia.  Respiratory: Negative.  Negative for cough and shortness of breath.    Cardiovascular: Negative.  Negative for chest pain, palpitations and leg swelling.  Gastrointestinal: Negative.  Negative for heartburn, nausea and vomiting.  Musculoskeletal:  Positive for joint pain. Negative for myalgias.  Neurological: Negative.  Negative for dizziness, focal weakness, seizures and headaches.  Psychiatric/Behavioral: Negative.  Negative for suicidal ideas.     Past Medical History:  Diagnosis Date   Anemia    Anxiety    Dysrhythmia    palpitations   GERD (gastroesophageal reflux disease)    Headache(784.0)    Hypertension    SVD (spontaneous vaginal delivery)    x 3    Past Surgical History:  Procedure Laterality Date   ABDOMINAL HYSTERECTOMY N/A 03/23/2013   Procedure: HYSTERECTOMY ABDOMINAL;  Surgeon: Lavonia Drafts, MD;  Location: [facility];  Service: Gynecology;  Laterality: N/A;   BILATERAL SALPINGECTOMY Bilateral 03/23/2013   Procedure: BILATERAL SALPINGECTOMY;  Surgeon: Lavonia Drafts, MD;  Location: [facility];  Service: Gynecology;  Laterality: Bilateral;   COLONOSCOPY  08/2013   Pyrtle - polyp   COLPOSCOPY     FOOT SURGERY     right 5th toe-bone removed n1997   WISDOM TOOTH EXTRACTION      Family History  Problem Relation Age of Onset   Arthritis Mother    Stroke Mother    Hypertension Mother    Atrial fibrillation Mother    Arthritis Father    Cancer Father        prostate   Stroke Father    Alcohol abuse Brother  Stroke Brother    Cancer Paternal Aunt        breast and uterine   Colon cancer Neg Hx    Rectal cancer Neg Hx    Stomach cancer Neg Hx    Colon polyps Neg Hx    Esophageal cancer Neg Hx     Social History Reviewed with no changes to be made today.   Outpatient Medications Prior to Visit  Medication Sig Dispense Refill   aspirin EC 81 MG tablet Take 81 mg by mouth daily. Swallow whole.     Multiple Vitamin (MULTIVITAMIN PO) Take 1 tablet by mouth daily.     citalopram (CELEXA) 10 MG tablet Take 1  tablet (10 mg total) by mouth daily. 90 tablet 3   diclofenac Sodium (VOLTAREN) 1 % GEL Apply 2 grams topically 4 (four) times daily. 100 g 0   hydrOXYzine (ATARAX) 10 MG tablet Take 1-2 tablets (10-20 mg total) by mouth 3 (three) times daily as needed. 60 tablet 0   lisinopril-hydrochlorothiazide (ZESTORETIC) 10-12.5 MG tablet TAKE 1 TABLET BY MOUTH DAILY. 90 tablet 1   meloxicam (MOBIC) 7.5 MG tablet Take 1 tablet (7.5 mg total) by mouth daily as needed for pain. 30 tablet 0   No facility-administered medications prior to visit.    No Known Allergies     Objective:    BP 127/83   Pulse 76   Temp 98 F (36.7 C) (Oral)   Ht _0  (1.575 m)   Wt 237 lb 12.8 oz (107.9 kg)   LMP  (LMP Unknown)   SpO2 97%   BMI 43.49 kg/m  Wt Readings from Last 3 Encounters:  02/06/22 237 lb 12.8 oz (107.9 kg)  09/11/21 238 lb (108 kg)  01/25/21 227 lb 6 oz (103.1 kg)    Physical Exam Vitals and nursing note reviewed.  Constitutional:      Appearance: She is well-developed.  HENT:     Head: Normocephalic and atraumatic.  Cardiovascular:     Rate and Rhythm: Normal rate and regular rhythm.     Heart sounds: Normal heart sounds. No murmur heard.    No friction rub. No gallop.  Pulmonary:     Effort: Pulmonary effort is normal. No tachypnea or respiratory distress.     Breath sounds: Normal breath sounds. No decreased breath sounds, wheezing, rhonchi or rales.  Chest:     Chest wall: No tenderness.  Abdominal:     General: Bowel sounds are normal.     Palpations: Abdomen is soft.  Musculoskeletal:        General: Normal range of motion.     Cervical back: Normal range of motion.  Skin:    General: Skin is warm and dry.  Neurological:     Mental Status: She is alert and oriented to person, place, and time.     Coordination: Coordination normal.  Psychiatric:        Behavior: Behavior normal. Behavior is cooperative.        Thought Content: Thought content normal.        Judgment:  Judgment normal.          Patient has been counseled extensively about nutrition and exercise as well as the importance of adherence with medications and regular follow-up. The patient was given clear instructions to go to ER or return to medical center if symptoms don't improve, worsen or new problems develop. The patient verbalized understanding.   Follow-up: Return for PHYSICAL.Marland Kitchen   Vernia Buff  Raul Del, FNP-BC [facility] and [facility], [place]   02/06/2022, 11:29 AM

## 2022-02-07 LAB — CMP14+EGFR
ALT: 14 IU/L (ref 0–32)
AST: 19 IU/L (ref 0–40)
Albumin/Globulin Ratio: 1.4 (ref 1.2–2.2)
Albumin: 4 g/dL (ref 3.8–4.9)
Alkaline Phosphatase: 74 IU/L (ref 44–121)
BUN/Creatinine Ratio: 16 (ref 12–28)
BUN: 16 mg/dL (ref 8–27)
Bilirubin Total: 0.4 mg/dL (ref 0.0–1.2)
CO2: 26 mmol/L (ref 20–29)
Calcium: 9.7 mg/dL (ref 8.7–10.3)
Chloride: 104 mmol/L (ref 96–106)
Creatinine, Ser: 0.98 mg/dL (ref 0.57–1.00)
Globulin, Total: 2.8 g/dL (ref 1.5–4.5)
Glucose: 86 mg/dL (ref 70–99)
Potassium: 4.6 mmol/L (ref 3.5–5.2)
Sodium: 143 mmol/L (ref 134–144)
Total Protein: 6.8 g/dL (ref 6.0–8.5)
eGFR: 66 mL/min/{1.73_m2} (ref >59)

## 2022-02-07 LAB — LIPID PANEL
Chol/HDL Ratio: 2.4 ratio (ref 0.0–4.4)
Cholesterol, Total: 185 mg/dL (ref 100–199)
HDL: 76 mg/dL (ref >39)
LDL Chol Calc (NIH): 96 mg/dL (ref 0–99)
Triglycerides: 72 mg/dL (ref 0–149)
VLDL Cholesterol Cal: 13 mg/dL (ref 5–40)

## 2022-02-07 LAB — CBC WITH DIFFERENTIAL/PLATELET
Basophils Absolute: 0 10*3/uL (ref 0.0–0.2)
Basos: 1 %
EOS (ABSOLUTE): 0.2 10*3/uL (ref 0.0–0.4)
Eos: 4 %
Hematocrit: 44.1 % (ref 34.0–46.6)
Hemoglobin: 14.2 g/dL (ref 11.1–15.9)
Immature Grans (Abs): 0 10*3/uL (ref 0.0–0.1)
Immature Granulocytes: 0 %
Lymphocytes Absolute: 1.6 10*3/uL (ref 0.7–3.1)
Lymphs: 38 %
MCH: 28.6 pg (ref 26.6–33.0)
MCHC: 32.2 g/dL (ref 31.5–35.7)
MCV: 89 fL (ref 79–97)
Monocytes Absolute: 0.4 10*3/uL (ref 0.1–0.9)
Monocytes: 9 %
Neutrophils Absolute: 2.1 10*3/uL (ref 1.4–7.0)
Neutrophils: 48 %
Platelets: 224 10*3/uL (ref 150–450)
RBC: 4.97 x10E6/uL (ref 3.77–5.28)
RDW: 13.8 % (ref 11.7–15.4)
WBC: 4.3 10*3/uL (ref 3.4–10.8)

## 2022-02-18 ENCOUNTER — Ambulatory Visit: Payer: 59 | Admitting: Nurse Practitioner

## 2022-03-10 ENCOUNTER — Other Ambulatory Visit: Payer: Self-pay | Admitting: Nurse Practitioner

## 2022-03-10 DIAGNOSIS — Z1231 Encounter for screening mammogram for malignant neoplasm of breast: Secondary | ICD-10-CM

## 2022-03-18 ENCOUNTER — Encounter: Payer: Self-pay | Admitting: Nurse Practitioner

## 2022-03-18 ENCOUNTER — Ambulatory Visit: Payer: Commercial Managed Care - HMO | Attending: Nurse Practitioner | Admitting: Nurse Practitioner

## 2022-03-18 VITALS — BP 122/79 | HR 81 | Temp 98.0°F | Wt 240.4 lb

## 2022-03-18 DIAGNOSIS — Z0001 Encounter for general adult medical examination with abnormal findings: Secondary | ICD-10-CM | POA: Diagnosis not present

## 2022-03-18 DIAGNOSIS — F32A Depression, unspecified: Secondary | ICD-10-CM

## 2022-03-18 DIAGNOSIS — Z6841 Body Mass Index (BMI) 40.0 and over, adult: Secondary | ICD-10-CM

## 2022-03-18 DIAGNOSIS — Z0000 Encounter for general adult medical examination without abnormal findings: Secondary | ICD-10-CM

## 2022-03-18 DIAGNOSIS — F419 Anxiety disorder, unspecified: Secondary | ICD-10-CM

## 2022-03-18 DIAGNOSIS — E6609 Other obesity due to excess calories: Secondary | ICD-10-CM | POA: Diagnosis not present

## 2022-03-18 NOTE — Progress Notes (Signed)
Assessment & Plan:  Ann [name] was seen today for annual exam.  Diagnoses and all orders for this visit:  Encounter for annual physical exam  Anxiety and depression -     Ambulatory referral to [place]    Patient has been counseled on age-appropriate routine health concerns for screening and prevention. These are reviewed and up-to-date. Referrals have been placed accordingly. Immunizations are up-to-date or declined.    Subjective:   Chief Complaint  Patient presents with   Annual Exam   HPI Ann [name] 61 y.o. female presents to office today for annual physical. 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 Notes increased stress. She is taking celexa for depression. Feels it is somewhat helpful. Does not wish to increase at this time. She is however interested in psychotherapy. Referral placed to LCSW today and she was also given resources for [facility].   Review of Systems  Constitutional:  Negative for fever, malaise/fatigue and weight loss.  HENT: Negative.  Negative for nosebleeds.   Eyes: Negative.  Negative for blurred vision, double vision and photophobia.  Respiratory: Negative.  Negative for cough and shortness of breath.   Cardiovascular: Negative.  Negative for chest pain, palpitations and leg swelling.  Gastrointestinal: Negative.  Negative for heartburn, nausea and vomiting.  Genitourinary: Negative.   Musculoskeletal: Negative.  Negative for myalgias.  Skin: Negative.   Neurological: Negative.  Negative for dizziness, focal weakness, seizures and headaches.  Endo/Heme/Allergies: Negative.   Psychiatric/Behavioral:  Positive for depression. Negative for suicidal ideas. The patient is nervous/anxious.     Past Medical History:  Diagnosis Date   Anemia    Anxiety    Dysrhythmia    palpitations   GERD (gastroesophageal reflux disease)    Headache(784.0)    Hypertension    SVD (spontaneous vaginal delivery)    x 3    Past  Surgical History:  Procedure Laterality Date   ABDOMINAL HYSTERECTOMY N/A 03/23/2013   Procedure: HYSTERECTOMY ABDOMINAL;  Surgeon: Lavonia Drafts, MD;  Location: [facility];  Service: Gynecology;  Laterality: N/A;   BILATERAL SALPINGECTOMY Bilateral 03/23/2013   Procedure: BILATERAL SALPINGECTOMY;  Surgeon: Lavonia Drafts, MD;  Location: [facility];  Service: Gynecology;  Laterality: Bilateral;   COLONOSCOPY  08/2013   Pyrtle - polyp   COLPOSCOPY     FOOT SURGERY     right 5th toe-bone removed n1997   WISDOM TOOTH EXTRACTION      Family History  Problem Relation Age of Onset   Arthritis Mother    Stroke Mother    Hypertension Mother    Atrial fibrillation Mother    Arthritis Father    Cancer Father        prostate   Stroke Father    Alcohol abuse Brother    Stroke Brother    Cancer Paternal Aunt        breast and uterine   Colon cancer Neg Hx    Rectal cancer Neg Hx    Stomach cancer Neg Hx    Colon polyps Neg Hx    Esophageal cancer Neg Hx     Social History Reviewed with no changes to be made today.   Outpatient Medications Prior to Visit  Medication Sig Dispense Refill   aspirin EC 81 MG tablet Take 81 mg by mouth daily. Swallow whole.     citalopram (CELEXA) 10 MG tablet Take 1 tablet (10 mg total) by mouth daily. 90 tablet 3   hydrOXYzine (ATARAX) 10 MG tablet  Take 1-2 tablets (10-20 mg total) by mouth 3 (three) times daily as needed. 60 tablet 3   lisinopril-hydrochlorothiazide (ZESTORETIC) 10-12.5 MG tablet TAKE 1 TABLET BY MOUTH DAILY. 90 tablet 1   meloxicam (MOBIC) 7.5 MG tablet Take 1 tablet (7.5 mg total) by mouth daily as needed for pain. 90 tablet 1   Multiple Vitamin (MULTIVITAMIN PO) Take 1 tablet by mouth daily.     No facility-administered medications prior to visit.    No Known Allergies     Objective:    BP 122/79   Pulse 81   Temp 98 F (36.7 C) (Temporal)   Wt 240 lb 6.4 oz (109 kg)   LMP  (LMP Unknown)   BMI 43.97 kg/m  Wt  Readings from Last 3 Encounters:  03/18/22 240 lb 6.4 oz (109 kg)  02/06/22 237 lb 12.8 oz (107.9 kg)  09/11/21 238 lb (108 kg)    Physical Exam Constitutional:      Appearance: She is well-developed.  HENT:     Head: Normocephalic and atraumatic.     Right Ear: Hearing, tympanic membrane, ear canal and external ear normal.     Left Ear: Hearing, tympanic membrane, ear canal and external ear normal.     Nose: Nose normal.     Right Turbinates: Not enlarged.     Left Turbinates: Not enlarged.     Mouth/Throat:     Lips: Pink.     Mouth: Mucous membranes are moist.     Dentition: No dental tenderness, gingival swelling, dental abscesses or gum lesions.     Pharynx: No oropharyngeal exudate.  Eyes:     General: No scleral icterus.       Right eye: No discharge.     Extraocular Movements: Extraocular movements intact.     Conjunctiva/sclera: Conjunctivae normal.     Pupils: Pupils are equal, round, and reactive to light.  Neck:     Thyroid: No thyromegaly.     Trachea: No tracheal deviation.  Cardiovascular:     Rate and Rhythm: Normal rate and regular rhythm.     Heart sounds: Normal heart sounds. No murmur heard.    No friction rub.  Pulmonary:     Effort: Pulmonary effort is normal. No accessory muscle usage or respiratory distress.     Breath sounds: Normal breath sounds. No decreased breath sounds, wheezing, rhonchi or rales.  Abdominal:     General: Bowel sounds are normal. There is no distension.     Palpations: Abdomen is soft. There is no mass.     Tenderness: There is no abdominal tenderness. There is no right CVA tenderness, left CVA tenderness, guarding or rebound.     Hernia: No hernia is present.  Musculoskeletal:        General: No tenderness or deformity. Normal range of motion.     Cervical back: Normal range of motion and neck supple.  Lymphadenopathy:     Cervical: No cervical adenopathy.  Skin:    General: Skin is warm and dry.     Findings: No  erythema.  Neurological:     Mental Status: She is alert and oriented to person, place, and time.     Cranial Nerves: No cranial nerve deficit.     Motor: Motor function is intact.     Coordination: Coordination is intact. Coordination normal.     Gait: Gait is intact.     Deep Tendon Reflexes:     Reflex Scores:  Patellar reflexes are 1+ on the right side and 1+ on the left side. Psychiatric:        Attention and Perception: Attention normal.        Mood and Affect: Mood normal.        Speech: Speech normal.        Behavior: Behavior normal.        Thought Content: Thought content normal.        Judgment: Judgment normal.          Patient has been counseled extensively about nutrition and exercise as well as the importance of adherence with medications and regular follow-up. The patient was given clear instructions to go to ER or return to medical center if symptoms don't improve, worsen or new problems develop. The patient verbalized understanding.   Follow-up: Return in about 3 months (around 06/18/2022).   Gildardo Pounds, FNP-BC [facility] and [facility] [place], [place]   03/18/2022, 10:57 AM

## 2022-03-21 ENCOUNTER — Ambulatory Visit
Admission: RE | Admit: 2022-03-21 | Discharge: 2022-03-21 | Disposition: A | Discharge status: Home / Self Care | Payer: Commercial Managed Care - HMO | Source: Ambulatory Visit

## 2022-03-21 DIAGNOSIS — Z1231 Encounter for screening mammogram for malignant neoplasm of breast: Secondary | ICD-10-CM

## 2022-04-11 DIAGNOSIS — Z419 Encounter for procedure for purposes other than remedying health state, unspecified: Secondary | ICD-10-CM | POA: Diagnosis not present

## 2022-05-12 DIAGNOSIS — Z419 Encounter for procedure for purposes other than remedying health state, unspecified: Secondary | ICD-10-CM | POA: Diagnosis not present

## 2022-05-20 ENCOUNTER — Telehealth: Payer: Self-pay | Admitting: Nurse Practitioner

## 2022-05-20 ENCOUNTER — Other Ambulatory Visit: Payer: Self-pay

## 2022-05-20 DIAGNOSIS — I10 Essential (primary) hypertension: Secondary | ICD-10-CM

## 2022-05-20 DIAGNOSIS — G8929 Other chronic pain: Secondary | ICD-10-CM

## 2022-05-20 DIAGNOSIS — M654 Radial styloid tenosynovitis [de Quervain]: Secondary | ICD-10-CM

## 2022-05-20 DIAGNOSIS — M25569 Pain in unspecified knee: Secondary | ICD-10-CM

## 2022-05-20 DIAGNOSIS — F419 Anxiety disorder, unspecified: Secondary | ICD-10-CM

## 2022-05-20 MED ORDER — CITALOPRAM HYDROBROMIDE 10 MG PO TABS
10.0000 mg | ORAL_TABLET | Freq: Every day | ORAL | 1 refills | Status: DC
  Filled 2022-05-20: qty 90, 90d supply, fill #0

## 2022-05-20 MED ORDER — MELOXICAM 7.5 MG PO TABS
7.5000 mg | ORAL_TABLET | Freq: Every day | ORAL | 0 refills | Status: DC
  Filled 2022-05-20: qty 30, 30d supply, fill #0

## 2022-05-20 MED ORDER — LISINOPRIL-HYDROCHLOROTHIAZIDE 10-12.5 MG PO TABS
1.0000 | ORAL_TABLET | Freq: Every day | ORAL | 0 refills | Status: DC
  Filled 2022-05-20: qty 30, 30d supply, fill #0
  Filled 2022-06-17: qty 30, 30d supply, fill #1

## 2022-05-20 MED ORDER — HYDROXYZINE HCL 10 MG PO TABS
10.0000 mg | ORAL_TABLET | Freq: Three times a day (TID) | ORAL | 1 refills | Status: DC | PRN
  Filled 2022-05-20: qty 60, 10d supply, fill #0

## 2022-05-20 NOTE — Telephone Encounter (Signed)
Unable to reach patient by phone. Medication was sent to [facility].

## 2022-05-20 NOTE — Telephone Encounter (Signed)
Patient called in, states she is using Ann [name].

## 2022-05-20 NOTE — Telephone Encounter (Signed)
Pt called, LVMTCB to get clarification on which pharmacy she is wanting to use. Previous rxs were sent to [facility] but [facility] pharmacy requested today.

## 2022-05-20 NOTE — Telephone Encounter (Signed)
Requested Prescriptions  Pending Prescriptions Disp Refills   citalopram (CELEXA) 10 MG tablet 90 tablet 1    Sig: Take 1 tablet (10 mg total) by mouth daily.     Psychiatry:  Antidepressants - SSRI Passed - 05/20/2022  2:01 PM      Passed - Valid encounter within last 6 months    Recent Outpatient Visits           2 months ago Encounter for annual physical exam   [place], Vernia Buff, NP   3 months ago Essential hypertension   [place], Vernia Buff, NP   8 months ago Chronic pain of left thumb   [place], [place], [place]   1 year ago Anxiety and depression   [place], Vernia Buff, NP   1 year ago Anxiety and depression   [place], Vernia Buff, NP       Future Appointments             In 4 weeks Gildardo Pounds, NP [place]             hydrOXYzine (ATARAX) 10 MG tablet 60 tablet 1    Sig: Take 1-2 tablets (10-20 mg total) by mouth 3 (three) times daily as needed.     Ear, Nose, and Throat:  Antihistamines 2 Passed - 05/20/2022  2:01 PM      Passed - Cr in normal range and within 360 days    Creat  Date Value Ref Range Status  06/23/2014 0.94 0.50 - 1.10 mg/dL Final   Creatinine, Ser  Date Value Ref Range Status  02/06/2022 0.98 0.57 - 1.00 mg/dL Final   Creatinine, POC  Date Value Ref Range Status  10/02/2016 200 mg/dL Final         Passed - Valid encounter within last 12 months    Recent Outpatient Visits           2 months ago Encounter for annual physical exam   [place], Vernia Buff, NP   3 months ago Essential hypertension   [place], Vernia Buff, NP   8 months ago Chronic pain of left thumb   [place], [place], [place]   1  year ago Anxiety and depression   [place], Vernia Buff, NP   1 year ago Anxiety and depression   [place], Vernia Buff, NP       Future Appointments             In 4 weeks Gildardo Pounds, NP [place]             lisinopril-hydrochlorothiazide (ZESTORETIC) 10-12.5 MG tablet 90 tablet 1    Sig: TAKE 1 TABLET BY MOUTH DAILY.     Cardiovascular:  ACEI + Diuretic Combos Passed - 05/20/2022  2:01 PM      Passed - Na in normal range and within 180 days    Sodium  Date Value Ref Range Status  02/06/2022 143 134 - 144 mmol/L Final         Passed - K in normal range and within 180 days    Potassium  Date Value  Ref Range Status  02/06/2022 4.6 3.5 - 5.2 mmol/L Final         Passed - Cr in normal range and within 180 days    Creat  Date Value Ref Range Status  06/23/2014 0.94 0.50 - 1.10 mg/dL Final   Creatinine, Ser  Date Value Ref Range Status  02/06/2022 0.98 0.57 - 1.00 mg/dL Final   Creatinine, POC  Date Value Ref Range Status  10/02/2016 200 mg/dL Final         Passed - eGFR is 30 or above and within 180 days    GFR, Est African American  Date Value Ref Range Status  06/23/2014 81 mL/min Final   GFR calc Af Amer  Date Value Ref Range Status  07/06/2020 69 >59 mL/min/1.73 Final    Comment:    **In accordance with recommendations from the NKF-ASN Task force,**   [facility] is in the process of updating its eGFR calculation to the   2021 CKD-EPI creatinine equation that estimates kidney function   without a race variable.    GFR, Est Non African American  Date Value Ref Range Status  06/23/2014 70 mL/min Final    Comment:      The estimated GFR is a calculation valid for adults (>=72 years old) that uses the CKD-EPI algorithm to adjust for age and sex. It is   not to be used for children, pregnant women, hospitalized patients,    patients on dialysis,  or with rapidly changing kidney function. According to the NKDEP, eGFR >89 is normal, 60-89 shows mild impairment, 30-59 shows moderate impairment, 15-29 shows severe impairment and <15 is ESRD.      GFR, Estimated  Date Value Ref Range Status  01/24/2021 >60 >60 mL/min Final    Comment:    (NOTE) Calculated using the CKD-EPI Creatinine Equation (2021)    eGFR  Date Value Ref Range Status  02/06/2022 66 >59 mL/min/1.73 Final         Passed - Patient is not pregnant      Passed - Last BP in normal range    BP Readings from Last 1 Encounters:  03/18/22 122/79         Passed - Valid encounter within last 6 months    Recent Outpatient Visits           2 months ago Encounter for annual physical exam   [place], Vernia Buff, NP   3 months ago Essential hypertension   [place], Vernia Buff, NP   8 months ago Chronic pain of left thumb   [place], [place], [place]   1 year ago Anxiety and depression   [place], Vernia Buff, NP   1 year ago Anxiety and depression   [place], Vernia Buff, NP       Future Appointments             In 4 weeks Gildardo Pounds, NP [place]             meloxicam (MOBIC) 7.5 MG tablet 30 tablet 0    Sig: Take 1 tablet (7.5 mg total) by mouth daily as needed for pain.     Analgesics:  COX2 Inhibitors Failed - 05/20/2022  2:01 PM      Failed - Manual Review: Labs are only  required if the patient has taken medication for more than 8 weeks.      Passed - HGB in normal range and within 360 days    Hemoglobin  Date Value Ref Range Status  02/06/2022 14.2 11.1 - 15.9 g/dL Final         Passed - Cr in normal range and within 360 days    Creat  Date Value Ref Range Status  06/23/2014 0.94 0.50 - 1.10 mg/dL Final    Creatinine, Ser  Date Value Ref Range Status  02/06/2022 0.98 0.57 - 1.00 mg/dL Final   Creatinine, POC  Date Value Ref Range Status  10/02/2016 200 mg/dL Final         Passed - HCT in normal range and within 360 days    Hematocrit  Date Value Ref Range Status  02/06/2022 44.1 34.0 - 46.6 % Final         Passed - AST in normal range and within 360 days    AST  Date Value Ref Range Status  02/06/2022 19 0 - 40 IU/L Final         Passed - ALT in normal range and within 360 days    ALT  Date Value Ref Range Status  02/06/2022 14 0 - 32 IU/L Final         Passed - eGFR is 30 or above and within 360 days    GFR, Est African American  Date Value Ref Range Status  06/23/2014 81 mL/min Final   GFR calc Af Amer  Date Value Ref Range Status  07/06/2020 69 >59 mL/min/1.73 Final    Comment:    **In accordance with recommendations from the NKF-ASN Task force,**   [facility] is in the process of updating its eGFR calculation to the   2021 CKD-EPI creatinine equation that estimates kidney function   without a race variable.    GFR, Est Non African American  Date Value Ref Range Status  06/23/2014 70 mL/min Final    Comment:      The estimated GFR is a calculation valid for adults (>=65 years old) that uses the CKD-EPI algorithm to adjust for age and sex. It is   not to be used for children, pregnant women, hospitalized patients,    patients on dialysis, or with rapidly changing kidney function. According to the NKDEP, eGFR >89 is normal, 60-89 shows mild impairment, 30-59 shows moderate impairment, 15-29 shows severe impairment and <15 is ESRD.      GFR, Estimated  Date Value Ref Range Status  01/24/2021 >60 >60 mL/min Final    Comment:    (NOTE) Calculated using the CKD-EPI Creatinine Equation (2021)    eGFR  Date Value Ref Range Status  02/06/2022 66 >59 mL/min/1.73 Final         Passed - Patient is not pregnant      Passed - Valid encounter within last 12  months    Recent Outpatient Visits           2 months ago Encounter for annual physical exam   [place], Vernia Buff, NP   3 months ago Essential hypertension   [place], Vernia Buff, NP   8 months ago Chronic pain of left thumb   [place], [place], [place]   1 year ago Anxiety and depression   [place], Vernia Buff, NP  1 year ago Anxiety and depression   [place], Vernia Buff, NP       Future Appointments             In 4 weeks Gildardo Pounds, NP [place]

## 2022-06-12 DIAGNOSIS — Z419 Encounter for procedure for purposes other than remedying health state, unspecified: Secondary | ICD-10-CM | POA: Diagnosis not present

## 2022-06-18 ENCOUNTER — Encounter: Payer: Self-pay | Admitting: Nurse Practitioner

## 2022-06-18 ENCOUNTER — Ambulatory Visit: Payer: Medicaid Other | Attending: Nurse Practitioner | Admitting: Nurse Practitioner

## 2022-06-18 ENCOUNTER — Other Ambulatory Visit: Payer: Self-pay

## 2022-06-18 VITALS — BP 106/73 | HR 78 | Ht 62.0 in | Wt 237.0 lb

## 2022-06-18 DIAGNOSIS — Z6841 Body Mass Index (BMI) 40.0 and over, adult: Secondary | ICD-10-CM

## 2022-06-18 DIAGNOSIS — G8929 Other chronic pain: Secondary | ICD-10-CM

## 2022-06-18 DIAGNOSIS — F32A Depression, unspecified: Secondary | ICD-10-CM | POA: Diagnosis not present

## 2022-06-18 DIAGNOSIS — F419 Anxiety disorder, unspecified: Secondary | ICD-10-CM | POA: Diagnosis not present

## 2022-06-18 DIAGNOSIS — M25512 Pain in left shoulder: Secondary | ICD-10-CM | POA: Diagnosis not present

## 2022-06-18 DIAGNOSIS — I10 Essential (primary) hypertension: Secondary | ICD-10-CM | POA: Diagnosis not present

## 2022-06-18 MED ORDER — CITALOPRAM HYDROBROMIDE 10 MG PO TABS
10.0000 mg | ORAL_TABLET | Freq: Every day | ORAL | 1 refills | Status: DC
  Filled 2022-06-18 – 2022-08-15 (×2): qty 90, 90d supply, fill #0
  Filled 2022-11-10: qty 90, 90d supply, fill #1

## 2022-06-18 MED ORDER — HYDROXYZINE HCL 10 MG PO TABS
10.0000 mg | ORAL_TABLET | Freq: Three times a day (TID) | ORAL | 1 refills | Status: DC | PRN
  Filled 2022-06-18: qty 60, 10d supply, fill #0
  Filled 2022-09-14: qty 60, 10d supply, fill #1

## 2022-06-18 MED ORDER — LISINOPRIL-HYDROCHLOROTHIAZIDE 10-12.5 MG PO TABS
1.0000 | ORAL_TABLET | Freq: Every day | ORAL | 1 refills | Status: DC
  Filled 2022-06-18: qty 30, 30d supply, fill #0
  Filled 2022-07-15 – 2022-07-22 (×2): qty 30, 30d supply, fill #1
  Filled 2022-08-15: qty 30, 30d supply, fill #2
  Filled 2022-09-16: qty 30, 30d supply, fill #3
  Filled 2022-10-17: qty 30, 30d supply, fill #4
  Filled 2022-11-10: qty 30, 30d supply, fill #5

## 2022-06-18 MED ORDER — MELOXICAM 7.5 MG PO TABS
7.5000 mg | ORAL_TABLET | Freq: Every day | ORAL | 0 refills | Status: DC
  Filled 2022-06-18: qty 30, 30d supply, fill #0

## 2022-06-18 NOTE — Progress Notes (Signed)
Assessment & Plan:  Ann [name] was seen today for hypertension.  Diagnoses and all orders for this visit:  Primary hypertension -     CMP14+EGFR -     lisinopril-hydrochlorothiazide (ZESTORETIC) 10-12.5 MG tablet; TAKE 1 TABLET BY MOUTH DAILY. Continue all antihypertensives as prescribed.  Reminded to bring in blood pressure log for follow  up appointment.  RECOMMENDATIONS: DASH/Mediterranean Diets are healthier choices for HTN.    Anxiety and depression -     Ambulatory referral to Behavioral Health -     citalopram (CELEXA) 10 MG tablet; Take 1 tablet (10 mg total) by mouth daily. -     hydrOXYzine (ATARAX) 10 MG tablet; Take 1-2 tablets (10-20 mg total) by mouth 3 (three) times daily as needed.  Chronic left shoulder pain -     meloxicam (MOBIC) 7.5 MG tablet; Take 1 tablet (7.5 mg total) by mouth daily as needed for pain.  Body mass index (BMI) 40.0-44.9, adult [facility]) -     CMP14+EGFR    Patient has been counseled on age-appropriate routine health concerns for screening and prevention. These are reviewed and up-to-date. Referrals have been placed accordingly. Immunizations are up-to-date or declined.    Subjective:   Chief Complaint  Patient presents with   Hypertension   HPI Ann [name] 62 y.o. female presents to office today for follow-up to hypertension.  Unfortunately her father passed in December.  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  She does have a history of depression and with recent increase stressors would like referral to behavioral health for counseling sessions/therapy.  Patient has been counseled on age-appropriate routine health concerns for screening and prevention. These are reviewed and up-to-date. Referrals have been placed accordingly. Immunizations are up-to-date or declined.     PAP SMEAR: Hysterectomy MAMMOGRAM: Up-to-date COLONOSCOPY: Up-to-date   Hypertension Blood pressure is well-controlled with  lisinopril-hydrochlorothiazide 10-12.5 mg daily. BP Readings from Last 3 Encounters:  06/18/22 106/73  03/18/22 122/79  02/06/22 127/83     Review of Systems  Constitutional:  Negative for fever, malaise/fatigue and weight loss.  HENT: Negative.  Negative for nosebleeds.   Eyes: Negative.  Negative for blurred vision, double vision and photophobia.  Respiratory: Negative.  Negative for cough and shortness of breath.   Cardiovascular: Negative.  Negative for chest pain, palpitations and leg swelling.  Gastrointestinal: Negative.  Negative for heartburn, nausea and vomiting.  Musculoskeletal:  Positive for joint pain (chronic). Negative for myalgias.  Neurological: Negative.  Negative for dizziness, focal weakness, seizures and headaches.  Psychiatric/Behavioral:  Positive for depression. Negative for suicidal ideas.     Past Medical History:  Diagnosis Date   Anemia    Anxiety    Dysrhythmia    palpitations   GERD (gastroesophageal reflux disease)    Headache(784.0)    Hypertension    SVD (spontaneous vaginal delivery)    x 3    Past Surgical History:  Procedure Laterality Date   ABDOMINAL HYSTERECTOMY N/A 03/23/2013   Procedure: HYSTERECTOMY ABDOMINAL;  Surgeon: Lavonia Drafts, MD;  Location: [facility];  Service: Gynecology;  Laterality: N/A;   BILATERAL SALPINGECTOMY Bilateral 03/23/2013   Procedure: BILATERAL SALPINGECTOMY;  Surgeon: Lavonia Drafts, MD;  Location: [facility];  Service: Gynecology;  Laterality: Bilateral;   COLONOSCOPY  08/2013   Pyrtle - polyp   COLPOSCOPY     FOOT SURGERY     right 5th toe-bone removed n1997   WISDOM TOOTH EXTRACTION  Family History  Problem Relation Age of Onset   Arthritis Mother    Stroke Mother    Hypertension Mother    Atrial fibrillation Mother    Arthritis Father    Cancer Father        prostate   Stroke Father    Alcohol abuse Brother    Stroke Brother    Cancer Paternal Aunt        breast and uterine    Colon cancer Neg Hx    Rectal cancer Neg Hx    Stomach cancer Neg Hx    Colon polyps Neg Hx    Esophageal cancer Neg Hx     Social History Reviewed with no changes to be made today.   Outpatient Medications Prior to Visit  Medication Sig Dispense Refill   aspirin EC 81 MG tablet Take 81 mg by mouth daily. Swallow whole.     Multiple Vitamin (MULTIVITAMIN PO) Take 1 tablet by mouth daily.     citalopram (CELEXA) 10 MG tablet Take 1 tablet (10 mg total) by mouth daily. 90 tablet 3   hydrOXYzine (ATARAX) 10 MG tablet Take 1-2 tablets (10-20 mg total) by mouth 3 (three) times daily as needed. 60 tablet 3   lisinopril-hydrochlorothiazide (ZESTORETIC) 10-12.5 MG tablet TAKE 1 TABLET BY MOUTH DAILY. 90 tablet 1   meloxicam (MOBIC) 7.5 MG tablet Take 1 tablet (7.5 mg total) by mouth daily as needed for pain. 90 tablet 1   citalopram (CELEXA) 10 MG tablet Take 1 tablet (10 mg total) by mouth daily. 90 tablet 1   hydrOXYzine (ATARAX) 10 MG tablet Take 1-2 tablets (10-20 mg total) by mouth 3 (three) times daily as needed. 60 tablet 1   lisinopril-hydrochlorothiazide (ZESTORETIC) 10-12.5 MG tablet TAKE 1 TABLET BY MOUTH DAILY. 90 tablet 0   meloxicam (MOBIC) 7.5 MG tablet Take 1 tablet (7.5 mg total) by mouth daily as needed for pain. 30 tablet 0   No facility-administered medications prior to visit.    No Known Allergies     Objective:    BP 106/73   Pulse 78   Ht '5\' 2"'$  (1.575 m)   Wt 237 lb (107.5 kg)   LMP  (LMP Unknown)   SpO2 100%   BMI 43.35 kg/m  Wt Readings from Last 3 Encounters:  06/18/22 237 lb (107.5 kg)  03/18/22 240 lb 6.4 oz (109 kg)  02/06/22 237 lb 12.8 oz (107.9 kg)    Physical Exam Vitals and nursing note reviewed.  Constitutional:      Appearance: She is well-developed.  HENT:     Head: Normocephalic and atraumatic.  Cardiovascular:     Rate and Rhythm: Normal rate and regular rhythm.     Heart sounds: Normal heart sounds. No murmur heard.    No  friction rub. No gallop.  Pulmonary:     Effort: Pulmonary effort is normal. No tachypnea or respiratory distress.     Breath sounds: Normal breath sounds. No decreased breath sounds, wheezing, rhonchi or rales.  Chest:     Chest wall: No tenderness.  Abdominal:     General: Bowel sounds are normal.     Palpations: Abdomen is soft.  Musculoskeletal:        General: Normal range of motion.     Cervical back: Normal range of motion.  Skin:    General: Skin is warm and dry.  Neurological:     Mental Status: She is alert and oriented to person, place, and time.  Coordination: Coordination normal.  Psychiatric:        Behavior: Behavior normal. Behavior is cooperative.        Thought Content: Thought content normal.        Judgment: Judgment normal.          Patient has been counseled extensively about nutrition and exercise as well as the importance of adherence with medications and regular follow-up. The patient was given clear instructions to go to ER or return to medical center if symptoms don't improve, worsen or new problems develop. The patient verbalized understanding.   Follow-up: Return in about 3 months (around 09/16/2022).   Gildardo Pounds, FNP-BC [facility] and [facility] [place], [place]   06/18/2022, 6:09 PM

## 2022-06-19 LAB — CMP14+EGFR
ALT: 18 IU/L (ref 0–32)
AST: 23 IU/L (ref 0–40)
Albumin/Globulin Ratio: 1.5 (ref 1.2–2.2)
Albumin: 4.3 g/dL (ref 3.8–4.9)
Alkaline Phosphatase: 73 IU/L (ref 44–121)
BUN/Creatinine Ratio: 15 (ref 12–28)
BUN: 16 mg/dL (ref 8–27)
Bilirubin Total: 0.3 mg/dL (ref 0.0–1.2)
CO2: 24 mmol/L (ref 20–29)
Calcium: 9.5 mg/dL (ref 8.7–10.3)
Chloride: 103 mmol/L (ref 96–106)
Creatinine, Ser: 1.04 mg/dL — ABNORMAL HIGH (ref 0.57–1.00)
Globulin, Total: 2.9 g/dL (ref 1.5–4.5)
Glucose: 88 mg/dL (ref 70–99)
Potassium: 4.2 mmol/L (ref 3.5–5.2)
Sodium: 140 mmol/L (ref 134–144)
Total Protein: 7.2 g/dL (ref 6.0–8.5)
eGFR: 62 mL/min/{1.73_m2} (ref >59)

## 2022-06-23 ENCOUNTER — Other Ambulatory Visit: Payer: Self-pay

## 2022-07-11 DIAGNOSIS — Z419 Encounter for procedure for purposes other than remedying health state, unspecified: Secondary | ICD-10-CM | POA: Diagnosis not present

## 2022-07-22 ENCOUNTER — Other Ambulatory Visit: Payer: Self-pay

## 2022-08-11 DIAGNOSIS — Z419 Encounter for procedure for purposes other than remedying health state, unspecified: Secondary | ICD-10-CM | POA: Diagnosis not present

## 2022-08-16 ENCOUNTER — Other Ambulatory Visit: Payer: Self-pay | Admitting: Nurse Practitioner

## 2022-08-16 DIAGNOSIS — G8929 Other chronic pain: Secondary | ICD-10-CM

## 2022-08-18 ENCOUNTER — Other Ambulatory Visit: Payer: Self-pay

## 2022-08-18 MED ORDER — MELOXICAM 7.5 MG PO TABS
7.5000 mg | ORAL_TABLET | Freq: Every day | ORAL | 1 refills | Status: DC
  Filled 2022-08-18: qty 30, 30d supply, fill #0
  Filled 2022-09-16: qty 30, 30d supply, fill #1

## 2022-08-18 NOTE — Telephone Encounter (Signed)
Requested Prescriptions  Pending Prescriptions Disp Refills   meloxicam (MOBIC) 7.5 MG tablet 30 tablet 1    Sig: Take 1 tablet (7.5 mg total) by mouth daily as needed for pain.     Analgesics:  COX2 Inhibitors Failed - 08/16/2022  2:17 AM      Failed - Manual Review: Labs are only required if the patient has taken medication for more than 8 weeks.      Failed - Cr in normal range and within 360 days    Creat  Date Value Ref Range Status  06/23/2014 0.94 0.50 - 1.10 mg/dL Final   Creatinine, Ser  Date Value Ref Range Status  06/18/2022 1.04 (H) 0.57 - 1.00 mg/dL Final   Creatinine, POC  Date Value Ref Range Status  10/02/2016 200 mg/dL Final         Passed - HGB in normal range and within 360 days    Hemoglobin  Date Value Ref Range Status  02/06/2022 14.2 11.1 - 15.9 g/dL Final         Passed - HCT in normal range and within 360 days    Hematocrit  Date Value Ref Range Status  02/06/2022 44.1 34.0 - 46.6 % Final         Passed - AST in normal range and within 360 days    AST  Date Value Ref Range Status  06/18/2022 23 0 - 40 IU/L Final         Passed - ALT in normal range and within 360 days    ALT  Date Value Ref Range Status  06/18/2022 18 0 - 32 IU/L Final         Passed - eGFR is 30 or above and within 360 days    GFR, Est African American  Date Value Ref Range Status  06/23/2014 81 mL/min Final   GFR calc Af Amer  Date Value Ref Range Status  07/06/2020 69 >59 mL/min/1.73 Final    Comment:    **In accordance with recommendations from the NKF-ASN Task force,**   [facility] is in the process of updating its eGFR calculation to the   2021 CKD-EPI creatinine equation that estimates kidney function   without a race variable.    GFR, Est Non African American  Date Value Ref Range Status  06/23/2014 70 mL/min Final    Comment:      The estimated GFR is a calculation valid for adults (>=73 years old) that uses the CKD-EPI algorithm to adjust for age and  sex. It is   not to be used for children, pregnant women, hospitalized patients,    patients on dialysis, or with rapidly changing kidney function. According to the NKDEP, eGFR >89 is normal, 60-89 shows mild impairment, 30-59 shows moderate impairment, 15-29 shows severe impairment and <15 is ESRD.      GFR, Estimated  Date Value Ref Range Status  01/24/2021 >60 >60 mL/min Final    Comment:    (NOTE) Calculated using the CKD-EPI Creatinine Equation (2021)    eGFR  Date Value Ref Range Status  06/18/2022 62 >59 mL/min/1.73 Final         Passed - Patient is not pregnant      Passed - Valid encounter within last 12 months    Recent Outpatient Visits           2 months ago Primary hypertension   [facility] [place], Shea Stakes, NP   5  months ago Encounter for annual physical exam   [facility] [place], [place], NP   6 months ago Essential hypertension   [facility] [place], Shea Stakes, NP   11 months ago Chronic pain of left thumb   [facility] [place], [place], [place]   1 year ago Anxiety and depression   [facility] [place], Shea Stakes, NP       Future Appointments             In 4 weeks Claiborne Rigg, NP [facility]

## 2022-08-21 ENCOUNTER — Ambulatory Visit (INDEPENDENT_AMBULATORY_CARE_PROVIDER_SITE_OTHER): Payer: 59 | Admitting: Clinical

## 2022-08-21 ENCOUNTER — Encounter (HOSPITAL_COMMUNITY): Payer: Self-pay

## 2022-08-21 DIAGNOSIS — F331 Major depressive disorder, recurrent, moderate: Secondary | ICD-10-CM | POA: Diagnosis not present

## 2022-08-21 NOTE — Progress Notes (Signed)
Comprehensive Clinical Assessment (CCA) Note  08/21/2022 Ann [name] [id number]  Virtual Visit via Video Note  I connected with Ann [name] on 08/21/2022 at 11:00 AM EDT by a video enabled telemedicine application and verified that I am speaking with the correct person using two identifiers.  Location: Patient: home Provider: office   I discussed the limitations of evaluation and management by telemedicine and the availability of in person appointments. The patient expressed understanding and agreed to proceed.   Follow Up Instructions: I discussed the assessment and treatment plan with the patient. The patient was provided an opportunity to ask questions and all were answered. The patient agreed with the plan and demonstrated an understanding of the instructions.   The patient was advised to call back or seek an in-person evaluation if the symptoms worsen or if the condition fails to improve as anticipated.  I provided 30 minutes of non-face-to-face time during this encounter.   Loree Fee, LCSW   Chief Complaint:  Chief Complaint  Patient presents with   Depression   Anxiety   Visit Diagnosis:  Major depressive disorder, recurrent episode, moderate with anxious distress    Interpretive Summary:  Client is a 63 year old female presenting to the [facility] health center for outpatient services. Client reported she is referred by her [facility] PCP for a clinical assessment due to reoccurring symptoms of depression, anxiety and grief. Client reported her symptoms for depression and anxiety have been reoccurring for over a year. Client reported those symptoms are marked by feeling mentally tired, not sleeping well, not making time to care for herself, depressed mood, nervousness, difficulty focusing, stomach pains, heart racing and uncontrollable sweating. Client reported onset of grief following her fathers passing in December 2023. Client reported she and  parents live together and she found her father having a stroke and he passed away in his bed that morning. Client reported she has had to deal with financial stressors since his passing and she has thoughts of wanting to do right by him. Client reported she has a good relationship with her mother, siblings and children but she tends to keep her feelings to herself because she has to be available for them in other ways.  Client reported she has isolated herself over time. Client reported having brief outpatient therapy over a year ago following and ER visit due to debilitating anxiety. Client reported no history of admissions for mental health reasons. Client reported her PCP has her managed on citalopram and hydroxyzine which work well to manage her symptoms. Client denied illicit substance use. Client presented oriented times five, appropriately dressed, and friendly. Client denied hallucinations, delusions, suicidal and homicidal ideations. Client was screened for pain, nutrition, columbia suicide severity and the following SDOH:    08/22/2022    8:07 AM 06/18/2022   10:47 AM 03/18/2022   10:34 AM 02/06/2022   11:08 AM  GAD 7 : Generalized Anxiety Score  Nervous, Anxious, on Edge 2 2 3 1   Control/stop worrying 2 2 3 2   Worry too much - different things 2 2 3 2   Trouble relaxing 2 2 2 2   Restless 1 1 2 1   Easily annoyed or irritable 2 2 3 1   Afraid - awful might happen 1 1 3 2   Total GAD 7 Score 12 12 19 11   Anxiety Difficulty Somewhat difficult        Flowsheet Row Counselor from 08/21/2022 in [facility]  PHQ-9 Total Score  8       Treatment recommendations: individual counseling. Client reported her PCP will continue to fill her medications.   Therapist provided information on format of appointment (virtual or face to face).   The client was advised to call back or seek an in-person evaluation if the symptoms worsen or if the condition fails to improve as  anticipated before the next scheduled appointment. Client was in agreement with treatment recommendations.    CCA Biopsychosocial Intake/Chief Complaint:  client reported she is referred by her [place] PCP for an assessment. client reported episodes of depression, anxiety and grief. Client reported depression/ anxiety has been well over  ayear but the grief began after her father passed in december 2023.  Current Symptoms/Problems: client reported indecivieness, fatigue  Patient Reported Schizophrenia/Schizoaffective Diagnosis in Past: No  Strengths: voluntarily seeking services  Preferences: therapy and psychiatry  Abilities: vocalize problems and needs  Type of Services Patient Feels are Needed: individual counseling  Initial Clinical Notes/Concerns: No data recorded  Mental Health Symptoms Depression:   Change in energy/activity; Difficulty Concentrating; Sleep (too much or little)   Duration of Depressive symptoms:  Greater than two weeks   Mania:   None   Anxiety:    Difficulty concentrating; Fatigue; Sleep; Tension   Psychosis:   None   Duration of Psychotic symptoms: No data recorded  Trauma:   None   Obsessions:   None   Compulsions:   None   Inattention:   None   Hyperactivity/Impulsivity:   None   Oppositional/Defiant Behaviors:   None   Emotional Irregularity:   None   Other Mood/Personality Symptoms:  No data recorded   Mental Status Exam Appearance and self-care  Stature:   Average   Weight:   Average weight   Clothing:   Casual   Grooming:   Normal   Cosmetic use:   Age appropriate   Posture/gait:   Normal   Motor activity:   Not Remarkable   Sensorium  Attention:   Normal   Concentration:   Normal   Orientation:   X5   Recall/memory:   Normal   Affect and Mood  Affect:   Congruent   Mood:   Euthymic   Relating  Eye contact:   Normal   Facial expression:   Responsive   Attitude toward  examiner:   Cooperative   Thought and Language  Speech flow:  Clear and Coherent   Thought content:   Appropriate to Mood and Circumstances   Preoccupation:   None   Hallucinations:   None   Organization:  No data recorded  [employer] of Knowledge:   Good   Intelligence:   Average   Abstraction:   Normal   Judgement:   Good   Reality Testing:   Adequate   Insight:   Good   Decision Making:   Normal   Social Functioning  Social Maturity:   Responsible; Isolates   Social Judgement:   Normal   Stress  Stressors:   Grief/losses; Transitions   Coping Ability:   Normal; Resilient   Skill Deficits:   Activities of daily living   Supports:   Family     Religion: Religion/Spirituality Are You A Religious Person?: Yes What is Your Religious Affiliation?: Christian  Leisure/Recreation: Leisure / Recreation Do You Have Hobbies?: No  Exercise/Diet: Exercise/Diet Do You Exercise?: No Have You Gained or Lost A Significant Amount of Weight in the Past Six Months?: No Do You  Follow a Special Diet?: No Do You Have Any Trouble Sleeping?: Yes   CCA Employment/Education Employment/Work Situation: Employment / Work Situation Employment Situation: Unemployed  Education: Education Did Garment/textile technologist From [employer]?: Yes Did Theme park manager?: Yes What Type of College Degree Do you Have?: some experience at [employer]   CCA Family/Childhood History Family and Relationship History: Family history Marital status: Single Does patient have children?: Yes How many children?: 2 How is patient's relationship with their children?: girls age 62 and she has 1 son (just moved in), 46 y/o daughter (lives in [place])  Childhood History:  Childhood History Additional childhood history information: client reported she was born in [place] and raised by both parents. client reported her childhood was good. client reported her parents were  always avaible and supportive. Does patient have siblings?: Yes Number of Siblings: 5 Description of patient's current relationship with siblings: 4 brothers and 1 sister Did patient suffer any verbal/emotional/physical/sexual abuse as a child?: No Did patient suffer from severe childhood neglect?: No Has patient ever been sexually abused/assaulted/raped as an adolescent or adult?: No Was the patient ever a victim of a crime or a disaster?: No Witnessed domestic violence?: No Has patient been affected by domestic violence as an adult?: No  Child/Adolescent Assessment:     CCA Substance Use Alcohol/Drug Use: Alcohol / Drug Use History of alcohol / drug use?: No history of alcohol / drug abuse                         ASAM's:  Six Dimensions of Multidimensional Assessment  Dimension 1:  Acute Intoxication and/or Withdrawal Potential:      Dimension 2:  Biomedical Conditions and Complications:      Dimension 3:  Emotional, Behavioral, or Cognitive Conditions and Complications:     Dimension 4:  Readiness to Change:     Dimension 5:  Relapse, Continued use, or Continued Problem Potential:     Dimension 6:  Recovery/Living Environment:     ASAM Severity Score:    ASAM Recommended Level of Treatment:     Substance use Disorder (SUD)    Recommendations for Services/Supports/Treatments: Recommendations for Services/Supports/Treatments Recommendations For Services/Supports/Treatments: Medication Management, Individual Therapy  DSM5 Diagnoses: Patient Active Problem List   Diagnosis Date Noted   Body mass index (BMI) 40.0-44.9, adult 06/18/2022   Palpitations 02/06/2014   Anxiety state, unspecified 11/18/2013   Abdominal pain 08/19/2013   Atypical chest pain 02/17/2013   Essential hypertension, benign 12/24/2012   Neck muscle spasm 08/06/2012   Shoulder pain 08/06/2012   Headache(784.0) 07/03/2010   Chest pain 07/03/2010   Elevated BP 07/03/2010    Patient  Centered Plan: Patient is on the following Treatment Plan(s):  Anxiety   Referrals to Alternative Service(s): Referred to Alternative Service(s):   Place:   Date:   Time:    Referred to Alternative Service(s):   Place:   Date:   Time:    Referred to Alternative Service(s):   Place:   Date:   Time:    Referred to Alternative Service(s):   Place:   Date:   Time:      Collaboration of Care: Referral or follow-up with counselor/therapist [facility]  Patient/Guardian was advised Release of Information must be obtained prior to any record release in order to collaborate their care with an outside provider. Patient/Guardian was advised if they have not already done so to contact the registration department to sign all necessary  forms in order for us to release information regarding their care.   Consent: Patient/Guardian gives verbal consent for treatment and assignment of benefits for services provided during this visit. Patient/Guardian expressed understanding and agreed to proceed.   Neena RhymesPaige Y Breeonna Mone, LCSW

## 2022-09-09 ENCOUNTER — Other Ambulatory Visit: Payer: Self-pay | Admitting: Nurse Practitioner

## 2022-09-09 ENCOUNTER — Telehealth: Payer: Self-pay

## 2022-09-09 DIAGNOSIS — Z0100 Encounter for examination of eyes and vision without abnormal findings: Secondary | ICD-10-CM

## 2022-09-09 NOTE — Telephone Encounter (Signed)
Copied from CRM [id number]. Topic: Referral - Request for Referral >> Sep 09, 2022 10:44 AM Clide Dales wrote: Has patient seen PCP for this complaint? Yes.   *If NO, is insurance requiring patient see PCP for this issue before PCP can refer them? Referral for which specialty: Opthalmology  Preferred provider/office: [facility]  Reason for referral: Routine eye exam/frequent headaches

## 2022-09-10 DIAGNOSIS — Z419 Encounter for procedure for purposes other than remedying health state, unspecified: Secondary | ICD-10-CM | POA: Diagnosis not present

## 2022-09-16 ENCOUNTER — Encounter: Payer: Self-pay | Admitting: Nurse Practitioner

## 2022-09-16 ENCOUNTER — Other Ambulatory Visit: Payer: Self-pay

## 2022-09-16 ENCOUNTER — Ambulatory Visit: Payer: 59 | Attending: Nurse Practitioner | Admitting: Nurse Practitioner

## 2022-09-16 VITALS — BP 122/82 | HR 81 | Ht 62.0 in | Wt 236.0 lb

## 2022-09-16 DIAGNOSIS — F419 Anxiety disorder, unspecified: Secondary | ICD-10-CM

## 2022-09-16 DIAGNOSIS — F32A Depression, unspecified: Secondary | ICD-10-CM | POA: Diagnosis not present

## 2022-09-16 DIAGNOSIS — I10 Essential (primary) hypertension: Secondary | ICD-10-CM | POA: Diagnosis not present

## 2022-09-16 MED ORDER — HYDROXYZINE HCL 10 MG PO TABS: 10.0000 mg | ORAL_TABLET | Freq: Three times a day (TID) | ORAL | 1 refills | Status: DC | PRN

## 2022-09-16 NOTE — Progress Notes (Signed)
Assessment & Plan:  Ann [name] was seen today for hypertension.  Diagnoses and all orders for this visit:  Primary hypertension Continue all antihypertensives as prescribed.  Reminded to bring in blood pressure log for follow  up appointment.  RECOMMENDATIONS: DASH/Mediterranean Diets are healthier choices for HTN.   -     CMP14+EGFR  Anxiety and depression Doing well today and has started seeing a counselor once a month -     hydrOXYzine (ATARAX) 10 MG tablet; Take 1-2 tablets (10-20 mg total) by mouth 3 (three) times daily as needed.    Patient has been counseled on age-appropriate routine health concerns for screening and prevention. These are reviewed and up-to-date. Referrals have been placed accordingly. Immunizations are up-to-date or declined.    Subjective:   Chief Complaint  Patient presents with   Hypertension   Hypertension Pertinent negatives include no blurred vision, chest pain, headaches, malaise/fatigue, palpitations or shortness of breath.   Ann [name] 62 y.o. female presents to office today for follow-up to hypertension.  Patient has been counseled on age-appropriate routine health concerns for screening and prevention. These are reviewed and up-to-date. Referrals have been placed accordingly. Immunizations are up-to-date or declined.     PAP SMEAR: Hysterectomy MAMMOGRAM: Up-to-date COLONOSCOPY: Up-to-date     Hypertension Blood pressure is well-controlled with lisinopril-hydrochlorothiazide 10-12.5 mg daily. BP Readings from Last 3 Encounters:  09/16/22 122/82  06/18/22 106/73  03/18/22 122/79     Review of Systems  Constitutional:  Negative for fever, malaise/fatigue and weight loss.  HENT: Negative.  Negative for nosebleeds.   Eyes: Negative.  Negative for blurred vision, double vision and photophobia.  Respiratory: Negative.  Negative for cough and shortness of breath.   Cardiovascular: Negative.  Negative for chest pain, palpitations and leg  swelling.  Gastrointestinal: Negative.  Negative for heartburn, nausea and vomiting.  Musculoskeletal: Negative.  Negative for myalgias.  Neurological: Negative.  Negative for dizziness, focal weakness, seizures and headaches.  Psychiatric/Behavioral: Negative.  Negative for suicidal ideas.     Past Medical History:  Diagnosis Date   Anemia    Anxiety    Dysrhythmia    palpitations   GERD (gastroesophageal reflux disease)    Headache(784.0)    Hypertension    SVD (spontaneous vaginal delivery)    x 3    Past Surgical History:  Procedure Laterality Date   ABDOMINAL HYSTERECTOMY N/A 03/23/2013   Procedure: HYSTERECTOMY ABDOMINAL;  Surgeon: Willodean Rosenthal, MD;  Location: [facility];  Service: Gynecology;  Laterality: N/A;   BILATERAL SALPINGECTOMY Bilateral 03/23/2013   Procedure: BILATERAL SALPINGECTOMY;  Surgeon: Willodean Rosenthal, MD;  Location: [facility];  Service: Gynecology;  Laterality: Bilateral;   COLONOSCOPY  08/2013   Pyrtle - polyp   COLPOSCOPY     FOOT SURGERY     right 5th toe-bone removed n1997   WISDOM TOOTH EXTRACTION      Family History  Problem Relation Age of Onset   Arthritis Mother    Stroke Mother    Hypertension Mother    Atrial fibrillation Mother    Arthritis Father    Cancer Father        prostate   Stroke Father    Alcohol abuse Brother    Stroke Brother    Cancer Paternal Aunt        breast and uterine   Colon cancer Neg Hx    Rectal cancer Neg Hx    Stomach cancer Neg Hx    Colon polyps Neg Hx  Esophageal cancer Neg Hx     Social History Reviewed with no changes to be made today.   Outpatient Medications Prior to Visit  Medication Sig Dispense Refill   aspirin EC 81 MG tablet Take 81 mg by mouth daily. Swallow whole.     citalopram (CELEXA) 10 MG tablet Take 1 tablet (10 mg total) by mouth daily. 90 tablet 1   lisinopril-hydrochlorothiazide (ZESTORETIC) 10-12.5 MG tablet TAKE 1 TABLET BY MOUTH DAILY. 90 tablet 1    meloxicam (MOBIC) 7.5 MG tablet Take 1 tablet (7.5 mg total) by mouth daily as needed for pain. 30 tablet 1   Multiple Vitamin (MULTIVITAMIN PO) Take 1 tablet by mouth daily.     hydrOXYzine (ATARAX) 10 MG tablet Take 1-2 tablets (10-20 mg total) by mouth 3 (three) times daily as needed. 60 tablet 1   No facility-administered medications prior to visit.    No Known Allergies     Objective:    BP 122/82 (BP Location: Left Arm, Patient Position: Sitting, Cuff Size: Large)   Pulse 81   Ht 5\' 2"  (1.575 m)   Wt 236 lb (107 kg)   LMP  (LMP Unknown)   SpO2 100%   BMI 43.16 kg/m  Wt Readings from Last 3 Encounters:  09/16/22 236 lb (107 kg)  06/18/22 237 lb (107.5 kg)  03/18/22 240 lb 6.4 oz (109 kg)    Physical Exam Vitals and nursing note reviewed.  Constitutional:      Appearance: She is well-developed.  HENT:     Head: Normocephalic and atraumatic.  Cardiovascular:     Rate and Rhythm: Normal rate and regular rhythm.     Heart sounds: Normal heart sounds. No murmur heard.    No friction rub. No gallop.  Pulmonary:     Effort: Pulmonary effort is normal. No tachypnea or respiratory distress.     Breath sounds: Normal breath sounds. No decreased breath sounds, wheezing, rhonchi or rales.  Chest:     Chest wall: No tenderness.  Abdominal:     General: Bowel sounds are normal.     Palpations: Abdomen is soft.  Musculoskeletal:        General: Normal range of motion.     Cervical back: Normal range of motion.  Skin:    General: Skin is warm and dry.  Neurological:     Mental Status: She is alert and oriented to person, place, and time.     Coordination: Coordination normal.  Psychiatric:        Behavior: Behavior normal. Behavior is cooperative.        Thought Content: Thought content normal.        Judgment: Judgment normal.          Patient has been counseled extensively about nutrition and exercise as well as the importance of adherence with medications and  regular follow-up. The patient was given clear instructions to go to ER or return to medical center if symptoms don't improve, worsen or new problems develop. The patient verbalized understanding.   Follow-up: Return in about 3 months (around 12/17/2022).   Claiborne Rigg, FNP-BC [facility] [place], [place] [phone]   09/16/2022, 11:34 AM

## 2022-09-17 LAB — CMP14+EGFR
ALT: 15 IU/L (ref 0–32)
AST: 23 IU/L (ref 0–40)
Albumin/Globulin Ratio: 1.5 (ref 1.2–2.2)
Albumin: 4.3 g/dL (ref 3.8–4.9)
Alkaline Phosphatase: 75 IU/L (ref 44–121)
BUN/Creatinine Ratio: 15 (ref 12–28)
BUN: 14 mg/dL (ref 8–27)
Bilirubin Total: 0.5 mg/dL (ref 0.0–1.2)
CO2: 24 mmol/L (ref 20–29)
Calcium: 9.8 mg/dL (ref 8.7–10.3)
Chloride: 100 mmol/L (ref 96–106)
Creatinine, Ser: 0.95 mg/dL (ref 0.57–1.00)
Globulin, Total: 2.9 g/dL (ref 1.5–4.5)
Glucose: 101 mg/dL — ABNORMAL HIGH (ref 70–99)
Potassium: 3.8 mmol/L (ref 3.5–5.2)
Sodium: 140 mmol/L (ref 134–144)
Total Protein: 7.2 g/dL (ref 6.0–8.5)
eGFR: 69 mL/min/{1.73_m2} (ref >59)

## 2022-09-30 ENCOUNTER — Ambulatory Visit (HOSPITAL_COMMUNITY): Payer: 59 | Admitting: Clinical

## 2022-10-11 DIAGNOSIS — Z419 Encounter for procedure for purposes other than remedying health state, unspecified: Secondary | ICD-10-CM | POA: Diagnosis not present

## 2022-10-21 ENCOUNTER — Other Ambulatory Visit: Payer: Self-pay

## 2022-11-04 ENCOUNTER — Encounter (HOSPITAL_COMMUNITY): Payer: Self-pay

## 2022-11-04 ENCOUNTER — Ambulatory Visit (HOSPITAL_COMMUNITY): Payer: 59 | Admitting: Clinical

## 2022-11-10 ENCOUNTER — Other Ambulatory Visit: Payer: Self-pay

## 2022-11-10 ENCOUNTER — Other Ambulatory Visit: Payer: Self-pay | Admitting: Nurse Practitioner

## 2022-11-10 DIAGNOSIS — Z419 Encounter for procedure for purposes other than remedying health state, unspecified: Secondary | ICD-10-CM | POA: Diagnosis not present

## 2022-11-10 DIAGNOSIS — G8929 Other chronic pain: Secondary | ICD-10-CM

## 2022-11-10 MED ORDER — MELOXICAM 7.5 MG PO TABS
7.5000 mg | ORAL_TABLET | Freq: Every day | ORAL | 0 refills | Status: DC
  Filled 2022-11-10: qty 30, 30d supply, fill #0

## 2022-11-12 ENCOUNTER — Ambulatory Visit: Payer: 59 | Attending: Physician Assistant | Admitting: Physician Assistant

## 2022-11-12 ENCOUNTER — Other Ambulatory Visit: Payer: Self-pay

## 2022-11-12 ENCOUNTER — Encounter: Payer: Self-pay | Admitting: Physician Assistant

## 2022-11-12 VITALS — BP 122/81 | HR 72 | Temp 98.6°F | Ht 62.0 in | Wt 235.0 lb

## 2022-11-12 DIAGNOSIS — S8001XA Contusion of right knee, initial encounter: Secondary | ICD-10-CM | POA: Diagnosis not present

## 2022-11-12 DIAGNOSIS — I10 Essential (primary) hypertension: Secondary | ICD-10-CM | POA: Diagnosis not present

## 2022-11-12 DIAGNOSIS — M7661 Achilles tendinitis, right leg: Secondary | ICD-10-CM

## 2022-11-12 DIAGNOSIS — M25662 Stiffness of left knee, not elsewhere classified: Secondary | ICD-10-CM

## 2022-11-12 MED ORDER — LISINOPRIL-HYDROCHLOROTHIAZIDE 10-12.5 MG PO TABS
1.0000 | ORAL_TABLET | Freq: Every day | ORAL | 1 refills | Status: DC
Start: 2022-11-12 — End: 2023-03-30
  Filled 2022-11-12: qty 30, 30d supply, fill #0
  Filled 2022-12-15: qty 30, 30d supply, fill #1
  Filled 2023-01-11 – 2023-01-14 (×2): qty 30, 30d supply, fill #2
  Filled 2023-02-11: qty 30, 30d supply, fill #3
  Filled 2023-03-12: qty 30, 30d supply, fill #4

## 2022-11-12 NOTE — Progress Notes (Signed)
Patient ID: Ann [name], female   DOB: May 09, 1961, 62 y.o.   MRN: [id number]     Ann [name], is a 62 y.o. female  [id number]  [id number]  DOB - 1961/04/01  Chief Complaint  Patient presents with   Fall    Fall X1 mo ago & injured R knee - tender, "popping sound" when walking R achilles heel X1 week - painful when shifting weight  L knee tightness x2 mo Med refills       Subjective:   Ann [name] is a 62 y.o. female here today for B knee pain and R achilles pain.  L knee tightness for about 6 weeks.  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  Sometimes painful and tender when climbing steps.  For the past 2 weeks she has been having pain over the R achilles tendon when she stands or shifts certain ways.    No swelling of knees or tendon.  No redness.  Takes mobic 7.5 daily  No problems updated.  ALLERGIES: No Known Allergies  PAST MEDICAL HISTORY: Past Medical History:  Diagnosis Date   Anemia    Anxiety    Dysrhythmia    palpitations   GERD (gastroesophageal reflux disease)    Headache(784.0)    Hypertension    SVD (spontaneous vaginal delivery)    x 3    MEDICATIONS AT HOME: Prior to Admission medications   Medication Sig Start Date End Date Taking? Authorizing Provider  aspirin EC 81 MG tablet Take 81 mg by mouth daily. Swallow whole.   Yes [provider]  citalopram (CELEXA) 10 MG tablet Take 1 tablet (10 mg total) by mouth daily. 06/18/22  Yes Claiborne Rigg, NP  hydrOXYzine (ATARAX) 10 MG tablet Take 1-2 tablets (10-20 mg total) by mouth 3 (three) times daily as needed. 09/16/22  Yes Claiborne Rigg, NP  meloxicam (MOBIC) 7.5 MG tablet Take 1 tablet (7.5 mg total) by mouth daily as needed for pain. 11/10/22  Yes Hoy Register, MD  Multiple Vitamin (MULTIVITAMIN PO) Take 1 tablet by  mouth daily.   Yes [provider]  lisinopril-hydrochlorothiazide (ZESTORETIC) 10-12.5 MG tablet TAKE 1 TABLET BY MOUTH DAILY. 11/12/22   Huntleigh Doolen, Marzella Schlein, PA-C    ROS: Neg HEENT Neg resp Neg cardiac Neg GI Neg GU Neg MS Neg psych Neg neuro  Objective:   Vitals:   11/12/22 1109  BP: 122/81  Pulse: 72  Temp: 98.6 F (37 C)  TempSrc: Oral  SpO2: 100%  Weight: 235 lb (106.6 kg)  Height: 5\' 2"  (1.575 m)   Exam General appearance : Awake, alert, not in any distress. Speech Clear. Not toxic looking HEENT: Atraumatic and Normocephalic Neck: Supple, no JVD. No cervical lymphadenopathy.  Chest: Good air entry bilaterally, CTAB.  No rales/rhonchi/wheezing CVS: S1 S2 regular, no murmurs.  Extremities: B/L Lower Ext shows no edema, both legs are warm to touch.  R knee with crepitus on extension.  No ballotment or effusion or erythema.  Ligaments stable.   Same on exam for L knee.  R achilles-mildly TTP to palpation/no redness.  Full ROM at ankle passive and against resistance.  Plantar flexion and dorsiflexion WNL Neurology: Awake alert, and oriented X 3, CN II-XII intact, Non focal Skin: No Rash  Data Review Lab Results  Component Value Date   HGBA1C 5.5 10/21/2019   HGBA1C 5.6 08/13/2016   HGBA1C 5.3 11/17/2012    Assessment & Plan   1. Primary hypertension controlled - lisinopril-hydrochlorothiazide (ZESTORETIC) 10-12.5 MG tablet; TAKE 1 TABLET BY MOUTH DAILY.  Dispense: 90 tablet; Refill: 1  2. Contusion of right knee, initial encounter Ligaments stable.  Suspect OA.  Exercises for quads, hamstring, and calves taught and demonstrated(non-weight bearing).   - DG Knee Complete 4 Views Right; Future  3. Knee stiff, left See #2 - DG Knee Complete 4 Views Left; Future  4. Achilles tendinitis of right lower extremity Exercises/stretches/ice.  Increase mobi to 15mg  daily for 2 weeks.  - Ambulatory referral to Orthopedic Surgery    Return if symptoms worsen  or fail to improve.  The patient was given clear instructions to go to ER or return to medical center if symptoms don't improve, worsen or new problems develop. The patient verbalized understanding. The patient was told to call to get lab results if they haven't heard anything in the next week.      Georgian Co, PA-C [facility] [place], [place] [phone]   11/12/2022, 1:01 PM

## 2022-11-12 NOTE — Patient Instructions (Signed)
Chronic Knee Pain, Adult Chronic knee pain is pain in one or both knees that lasts longer than 3 months. Symptoms of chronic knee pain may include swelling, stiffness, and discomfort. Age-related wear and tear (osteoarthritis) of the knee joint is the most common cause of chronic knee pain. Other possible causes include: A long-term immune-related disease that causes inflammation of the knee (rheumatoid arthritis). This usually affects both knees. Inflammatory arthritis, such as gout or pseudogout. An injury to the knee that causes arthritis. An injury to the knee that damages the ligaments. Ligaments are strong tissues that connect bones to each other. Runner's knee or pain behind the kneecap. Treatment for chronic knee pain depends on the cause. The main treatments for chronic knee pain are physical therapy and weight loss. This condition may also be treated with medicines, injections, a knee sleeve or brace, and by using crutches. Rest, ice, pressure (compression), and elevation, also known as RICE therapy, may also be recommended. Follow these instructions at home: If you have a knee sleeve or brace:  Wear the knee sleeve or brace as told by your health care provider. Remove it only as told by your health care provider. Loosen it if your toes tingle, become numb, or turn cold and blue. Keep it clean. If the sleeve or brace is not waterproof: Do not let it get wet. Remove it if allowed by your health care provider, or cover it with a watertight covering when you take a bath or a shower. Managing pain, stiffness, and swelling     If directed, apply heat to the affected area as often as told by your health care provider. Use the heat source that your health care provider recommends, such as a moist heat pack or a heating pad. If you have a removable knee sleeve or brace, remove it as told by your health care provider. Place a towel between your skin and the heat source. Leave the heat on for  20-30 minutes. Remove the heat if your skin turns bright red. This is especially important if you are unable to feel pain, heat, or cold. You may have a greater risk of getting burned. If directed, put ice on the affected area. To do this: If you have a removable knee sleeve or brace, remove it as told by your health care provider. Put ice in a plastic bag. Place a towel between your skin and the bag. Leave the ice on for 20 minutes, 2-3 times a day. Remove the ice if your skin turns bright red. This is very important. If you cannot feel pain, heat, or cold, you have a greater risk of damage to the area. Move your toes often to reduce stiffness and swelling. Raise (elevate) the injured area above the level of your heart while you are sitting or lying down. Activity Avoid high-impact activities or exercises, such as running, jumping rope, or doing jumping jacks. Follow the exercise plan that your health care provider designed for you. Your health care provider may suggest that you: Avoid activities that make knee pain worse. This may require you to change your exercise routines, sport participation, or job duties. Wear shoes with cushioned soles. Avoid sports that require running and sudden changes in direction. Do physical therapy. Physical therapy is planned to match your needs and abilities. It may include exercises for strength, flexibility, stability, and endurance. Do exercises that increase balance and strength, such as tai chi and yoga. Do not use the injured limb to support your   body weight until your health care provider says that you can. Use crutches as told by your health care provider. Return to your normal activities as told by your health care provider. Ask your health care provider what activities are safe for you. General instructions Take over-the-counter and prescription medicines only as told by your health care provider. Lose weight if you are overweight. Losing even a  little weight can reduce knee pain. Ask your health care provider what your ideal weight is, and how to safely lose extra weight. A dietitian may be able to help you plan your meals. Do not use any products that contain nicotine or tobacco, such as cigarettes, e-cigarettes, and chewing tobacco. These can delay healing. If you need help quitting, ask your health care provider. Keep all follow-up visits. This is important. Contact a health care provider if: You have knee pain that is not getting better or gets worse. You are unable to do your physical therapy exercises due to knee pain. Get help right away if: Your knee swells and the swelling becomes worse. You cannot move your knee. You have severe knee pain. Summary Knee pain that lasts more than 3 months is considered chronic knee pain. The main treatments for chronic knee pain are physical therapy and weight loss. You may also need to take medicines, wear a knee sleeve or brace, use crutches, and apply ice or heat. Losing even a little weight can reduce knee pain. Ask your health care provider what your ideal weight is, and how to safely lose extra weight. A dietitian may be able to help you plan your meals. Follow the exercise plan that your health care provider designed for you. This information is not intended to replace advice given to you by your health care provider. Make sure you discuss any questions you have with your health care provider. Document Revised: 10/11/2019 Document Reviewed: 10/12/2019 Elsevier Patient Education  2024 [employer] Achilles Tendinitis  Achilles tendinitis is inflammation of the tough, cord-like band that connects the lower leg muscles to the heel bone (Achilles tendon). This is often caused by using the tendon and ankle joint too much. In most cases, Achilles tendinitis gets better over time with treatment and home care. It can take weeks or months to fully heal. What are the causes? This condition may  be caused by: A sudden increase in exercise or activity, such as running. Doing the same exercises or activities, such as jumping, over and over. Not warming up your calf muscles before you exercise. Exercising in shoes that are worn out or not made for exercise. Having arthritis or a bone growth (spur) on the back of your heel. This can rub against the tendon and hurt it. Age-related wear and tear. Tendons become less flexible with age and are more likely to be injured. What are the signs or symptoms? Common symptoms of this condition include: Pain in your Achilles tendon or in the back of your leg, just above your heel. The pain may get worse when you exercise. Stiffness or soreness in the back of your leg. You may feel it most often in the morning. Swelling of the skin over the Achilles tendon. Thickening of the tendon. Trouble standing on tiptoe. How is this diagnosed? This condition is diagnosed based on your symptoms and a physical exam. You may also have tests, such as: X-rays. MRI. Ultrasound. How is this treated? The goal of treatment is to relieve symptoms and help your injury heal. You may  need to: Decrease or stop activities that caused the tendinitis. You may be told to switch to low-impact exercises like biking or swimming. Ice the injured area. Do physical therapy. This may include strengthening and stretching exercises. Take NSAIDs, such as ibuprofen. These can help with pain and swelling. Use supportive shoes, wraps, heel lifts, or a walking boot (air cast). Have surgery. This may be done if your symptoms do not get better with other treatments. Use high-energy waves to start the healing process (extracorporeal shock wave therapy). This is rare. Get an injection of medicines that help with inflammation (corticosteroids). This is rare. Follow these instructions at home: If you have an air cast: Wear the air cast as told by your health care provider. Remove it only as  told by your provider. Check the skin around the air cast every day. Tell your provider about any concerns. Loosen the air cast if your toes tingle, become numb, or turn cold and blue. Keep the air cast clean. If the air cast is not waterproof: Do not let it get wet. Cover it with a watertight covering when you take a bath or shower. Managing pain, stiffness, and swelling  If told, put ice on the injured area. If you have a removable air cast, remove it as told by your provider. Put ice in a plastic bag. Place a towel between your skin and the bag. Leave the ice on for 20 minutes, 2-3 times a day. If your skin turns bright red, remove the ice right away to prevent skin damage. The risk of damage is higher if you cannot feel pain, heat, or cold. Move your toes often to reduce stiffness and swelling. Raise (elevate) your foot above the level of your heart while you are sitting or lying down. Activity Do not do activities that cause pain. Ask your provider when it is safe to drive if you have an air cast on your foot. If you go to physical therapy, do exercises as told by your provider or therapist. Return to your normal activities as told by your provider. Ask your provider what activities are safe for you. General instructions Take over-the-counter and prescription medicines only as told by your provider. If told, wrap your foot with an elastic bandage or other wrap. This can help to keep your tendon from moving too much while it heals. Your provider will show you how to wrap your foot. Wear supportive shoes or heel lifts only as told by your provider. Contact a health care provider if: Your symptoms get worse. Your pain does not get better with medicine. You have new symptoms that you cannot explain. You have warmth and swelling in your foot. You have a fever. Get help right away if: You hear a sudden popping sound in your Achilles tendon and then have severe pain. You cannot move  your toes or foot. You cannot put any weight on your foot. Your foot or toes become numb and look white or blue even after you loosen your bandage or air cast. This information is not intended to replace advice given to you by your health care provider. Make sure you discuss any questions you have with your health care provider. Document Revised: 01/17/2022 Document Reviewed: 01/17/2022 Elsevier Patient Education  2024 [employer].

## 2022-11-19 ENCOUNTER — Ambulatory Visit
Admission: RE | Admit: 2022-11-19 | Discharge: 2022-11-19 | Disposition: A | Discharge status: Home / Self Care | Payer: Medicaid Other | Source: Ambulatory Visit | Attending: Physician Assistant | Admitting: Physician Assistant

## 2022-11-19 DIAGNOSIS — M25662 Stiffness of left knee, not elsewhere classified: Secondary | ICD-10-CM

## 2022-11-19 DIAGNOSIS — M170 Bilateral primary osteoarthritis of knee: Secondary | ICD-10-CM | POA: Diagnosis not present

## 2022-11-19 DIAGNOSIS — S8001XA Contusion of right knee, initial encounter: Secondary | ICD-10-CM

## 2022-11-24 ENCOUNTER — Telehealth: Payer: Self-pay

## 2022-11-24 NOTE — Telephone Encounter (Signed)
Duplicate message see other attempt

## 2022-11-24 NOTE — Telephone Encounter (Signed)
Pt was called and vm was left, Information has been sent to nurse pool.   

## 2022-11-24 NOTE — Telephone Encounter (Signed)
-----   Message from Georgian Co sent at 11/24/2022 11:08 AM EDT ----- Please call patient.  Both knee xrays show arthritis and the L knee had a small amount of fluid in it.  See orthopedist as planned. (Please check status of referral.) thanks, Georgian Co, PA-C

## 2022-11-24 NOTE — Telephone Encounter (Signed)
-----   Message from Georgian Co sent at 11/24/2022 11:09 AM EDT ----- Duplicate message for xrays done same day-Please call patient.  Both knee xrays show arthritis and the L knee had a small amount of fluid in it.  See orthopedist as planned. (Please check status of referral.) thanks, Georgian Co, PA-C

## 2022-11-25 ENCOUNTER — Ambulatory Visit (INDEPENDENT_AMBULATORY_CARE_PROVIDER_SITE_OTHER): Payer: 59 | Admitting: Physician Assistant

## 2022-11-25 ENCOUNTER — Encounter: Payer: Self-pay | Admitting: Physician Assistant

## 2022-11-25 DIAGNOSIS — M170 Bilateral primary osteoarthritis of knee: Secondary | ICD-10-CM | POA: Diagnosis not present

## 2022-11-25 NOTE — Progress Notes (Signed)
Office Visit Note   Patient: Ann [name]           Date of Birth: 10-Mar-1961           MRN: [id number] Visit Date: 11/25/2022              Requested by: Anders Simmonds, PA-C [address] [place],  [place] [address] PCP: Claiborne Rigg, NP   Assessment & Plan: Visit Diagnoses:  1. Bilateral primary osteoarthritis of knee     Plan: Impression is bilateral knee osteoarthritis left greater than right.  Today, we have discussed various treatment options to include NSAIDs versus activity modification versus steroid injections.  She would like to think about injections for now and will follow-up when she is ready.  Call with concerns or questions.  Follow-Up Instructions: No follow-ups on file.   Orders:  No orders of the defined types were placed in this encounter.  No orders of the defined types were placed in this encounter.     Procedures: No procedures performed   Clinical Data: No additional findings.   Subjective: Chief Complaint  Patient presents with   Right Knee - Pain   Left Knee - Pain    HPI patient is a pleasant 62 year old female who comes in today with bilateral knee pain left greater than right for the past 2 months.  The pain she has in the left knee is primarily to the anteromedial aspect and is described as a stiffness when she has been in a position for a period of time.  She has associated popping as well.  In regards to the right knee, pain is primarily occurring when she is stair climbing.  She has been taking Mobic which helps just a little.  No previous cortisone injection to either knee.  Review of Systems as detailed in HPI.  All others reviewed are negative.   Objective: Vital Signs: LMP  (LMP Unknown)   Physical Exam well-developed well-nourished female in no acute distress.  Alert and oriented x 3.  Ortho Exam bilateral knee exam: Small effusion.  Range of motion 0 to 100 degrees.  Medial and lateral joint line tenderness  on the left, lateral joint line tenderness on the right.  Moderate patellofemoral crepitus both sides.  Ligaments are stable.  She is neurovascular intact distally.  Specialty Comments:  No specialty comments available.  Imaging: X-rays reviewed by me in canopy show moderate degenerative changes to the medial and patellofemoral compartments of the left and mild degenerative changes to the lateral and patellofemoral compartments on the right.   PMFS History: Patient Active Problem List   Diagnosis Date Noted   Body mass index (BMI) 40.0-44.9, adult (HCC) 06/18/2022   Palpitations 02/06/2014   Anxiety state, unspecified 11/18/2013   Abdominal pain 08/19/2013   Atypical chest pain 02/17/2013   Essential hypertension, benign 12/24/2012   Neck muscle spasm 08/06/2012   Shoulder pain 08/06/2012   Headache(784.0) 07/03/2010   Chest pain 07/03/2010   Elevated BP 07/03/2010   Past Medical History:  Diagnosis Date   Anemia    Anxiety    Dysrhythmia    palpitations   GERD (gastroesophageal reflux disease)    Headache(784.0)    Hypertension    SVD (spontaneous vaginal delivery)    x 3    Family History  Problem Relation Age of Onset   Arthritis Mother    Stroke Mother    Hypertension Mother    Atrial fibrillation Mother  Arthritis Father    Cancer Father        prostate   Stroke Father    Alcohol abuse Brother    Stroke Brother    Cancer Paternal Aunt        breast and uterine   Colon cancer Neg Hx    Rectal cancer Neg Hx    Stomach cancer Neg Hx    Colon polyps Neg Hx    Esophageal cancer Neg Hx     Past Surgical History:  Procedure Laterality Date   ABDOMINAL HYSTERECTOMY N/A 03/23/2013   Procedure: HYSTERECTOMY ABDOMINAL;  Surgeon: Willodean Rosenthal, MD;  Location: [facility];  Service: Gynecology;  Laterality: N/A;   BILATERAL SALPINGECTOMY Bilateral 03/23/2013   Procedure: BILATERAL SALPINGECTOMY;  Surgeon: Willodean Rosenthal, MD;  Location: [facility];   Service: Gynecology;  Laterality: Bilateral;   COLONOSCOPY  08/2013   Pyrtle - polyp   COLPOSCOPY     FOOT SURGERY     right 5th toe-bone removed n1997   WISDOM TOOTH EXTRACTION     Social History   Occupational History   Not on file  Tobacco Use   Smoking status: Never   Smokeless tobacco: Never  Vaping Use   Vaping status: Never Used  Substance and Sexual Activity   Alcohol use: Yes    Alcohol/week: 7.0 standard drinks of alcohol    Types: 7 Glasses of wine per week    Comment: social drinker   Drug use: No   Sexual activity: Not Currently    Birth control/protection: Surgical    Comment: HYSTERECTOMY

## 2022-12-11 DIAGNOSIS — Z419 Encounter for procedure for purposes other than remedying health state, unspecified: Secondary | ICD-10-CM | POA: Diagnosis not present

## 2022-12-15 ENCOUNTER — Other Ambulatory Visit: Payer: Self-pay | Admitting: Family Medicine

## 2022-12-15 DIAGNOSIS — G8929 Other chronic pain: Secondary | ICD-10-CM

## 2022-12-16 ENCOUNTER — Other Ambulatory Visit: Payer: Self-pay

## 2022-12-16 MED ORDER — MELOXICAM 7.5 MG PO TABS
7.5000 mg | ORAL_TABLET | Freq: Every day | ORAL | 0 refills | Status: DC
Start: 2022-12-16 — End: 2023-03-12
  Filled 2022-12-16: qty 90, 90d supply, fill #0

## 2022-12-16 NOTE — Telephone Encounter (Signed)
Requested Prescriptions  Pending Prescriptions Disp Refills   meloxicam (MOBIC) 7.5 MG tablet 90 tablet 0    Sig: Take 1 tablet (7.5 mg total) by mouth daily as needed for pain.     Analgesics:  COX2 Inhibitors Failed - 12/15/2022  8:58 PM      Failed - Manual Review: Labs are only required if the patient has taken medication for more than 8 weeks.      Passed - HGB in normal range and within 360 days    Hemoglobin  Date Value Ref Range Status  02/06/2022 14.2 11.1 - 15.9 g/dL Final         Passed - Cr in normal range and within 360 days    Creat  Date Value Ref Range Status  06/23/2014 0.94 0.50 - 1.10 mg/dL Final   Creatinine, Ser  Date Value Ref Range Status  09/16/2022 0.95 0.57 - 1.00 mg/dL Final   Creatinine, POC  Date Value Ref Range Status  10/02/2016 200 mg/dL Final         Passed - HCT in normal range and within 360 days    Hematocrit  Date Value Ref Range Status  02/06/2022 44.1 34.0 - 46.6 % Final         Passed - AST in normal range and within 360 days    AST  Date Value Ref Range Status  09/16/2022 23 0 - 40 IU/L Final         Passed - ALT in normal range and within 360 days    ALT  Date Value Ref Range Status  09/16/2022 15 0 - 32 IU/L Final         Passed - eGFR is 30 or above and within 360 days    GFR, Est African American  Date Value Ref Range Status  06/23/2014 81 mL/min Final   GFR calc Af Amer  Date Value Ref Range Status  07/06/2020 69 >59 mL/min/1.73 Final    Comment:    **In accordance with recommendations from the NKF-ASN Task force,**   [facility] is in the process of updating its eGFR calculation to the   2021 CKD-EPI creatinine equation that estimates kidney function   without a race variable.    GFR, Est Non African American  Date Value Ref Range Status  06/23/2014 70 mL/min Final    Comment:      The estimated GFR is a calculation valid for adults (>=27 years old) that uses the CKD-EPI algorithm to adjust for age and sex.  It is   not to be used for children, pregnant women, hospitalized patients,    patients on dialysis, or with rapidly changing kidney function. According to the NKDEP, eGFR >89 is normal, 60-89 shows mild impairment, 30-59 shows moderate impairment, 15-29 shows severe impairment and <15 is ESRD.      GFR, Estimated  Date Value Ref Range Status  01/24/2021 >60 >60 mL/min Final    Comment:    (NOTE) Calculated using the CKD-EPI Creatinine Equation (2021)    eGFR  Date Value Ref Range Status  09/16/2022 69 >59 mL/min/1.73 Final         Passed - Patient is not pregnant      Passed - Valid encounter within last 12 months    Recent Outpatient Visits           1 month ago Contusion of right knee, initial encounter   [facility] [place], [place], [place]  3 months ago Primary hypertension   [facility] [place], Shea Stakes, NP   6 months ago Primary hypertension   [facility] [place], Shea Stakes, NP   9 months ago Encounter for annual physical exam   [facility] [place], Shea Stakes, NP   10 months ago Essential hypertension   [facility] Claiborne Rigg, NP       Future Appointments             In 1 week Claiborne Rigg, NP [facility]

## 2022-12-23 ENCOUNTER — Ambulatory Visit: Payer: 59 | Attending: Nurse Practitioner | Admitting: Nurse Practitioner

## 2022-12-23 ENCOUNTER — Other Ambulatory Visit: Payer: Self-pay

## 2022-12-23 ENCOUNTER — Encounter: Payer: Self-pay | Admitting: Nurse Practitioner

## 2022-12-23 VITALS — BP 115/76 | HR 77 | Ht 62.0 in | Wt 231.6 lb

## 2022-12-23 DIAGNOSIS — I10 Essential (primary) hypertension: Secondary | ICD-10-CM | POA: Diagnosis not present

## 2022-12-23 DIAGNOSIS — F32A Depression, unspecified: Secondary | ICD-10-CM | POA: Diagnosis not present

## 2022-12-23 DIAGNOSIS — F419 Anxiety disorder, unspecified: Secondary | ICD-10-CM

## 2022-12-23 DIAGNOSIS — Z23 Encounter for immunization: Secondary | ICD-10-CM | POA: Diagnosis not present

## 2022-12-23 MED ORDER — CITALOPRAM HYDROBROMIDE 10 MG PO TABS
10.0000 mg | ORAL_TABLET | Freq: Every day | ORAL | 1 refills | Status: DC
Start: 2022-12-23 — End: 2023-07-28
  Filled 2022-12-23 – 2023-02-11 (×2): qty 90, 90d supply, fill #0
  Filled 2023-05-13: qty 30, 30d supply, fill #1
  Filled 2023-06-11: qty 30, 30d supply, fill #2
  Filled 2023-07-14: qty 30, 30d supply, fill #3

## 2022-12-23 NOTE — Progress Notes (Signed)
Assessment & Plan:  Ann [name] was seen today for medical management of chronic issues.  Diagnoses and all orders for this visit:  Primary hypertension -     Basic metabolic panel Continue all antihypertensives as prescribed.  Reminded to bring in blood pressure log for follow  up appointment.  RECOMMENDATIONS: DASH/Mediterranean Diets are healthier choices for HTN.    Need for diphtheria-tetanus-pertussis (Tdap) vaccine -     Tdap vaccine greater than or equal to 7yo IM    Patient has been counseled on age-appropriate routine health concerns for screening and prevention. These are reviewed and up-to-date. Referrals have been placed accordingly. Immunizations are up-to-date or declined.    Subjective:   Chief Complaint  Patient presents with   Medical Management of Chronic Issues   HPI Ann [name] 62 y.o. female presents to office today for follow up to HTN   Patient has been counseled on age-appropriate routine health concerns for screening and prevention. These are reviewed and up-to-date. Referrals have been placed accordingly. Immunizations are up-to-date or declined.     PAP SMEAR: Hysterectomy MAMMOGRAM: Up-to-date COLONOSCOPY: Up-to-date     Hypertension Blood pressure is well-controlled with lisinopril-hydrochlorothiazide 10-12.5 mg daily BP Readings from Last 3 Encounters:  12/23/22 115/76  11/12/22 122/81  09/16/22 122/82    Experiencing lots of stressors at home.  Currently prescribed Celexa 10 mg daily and hydroxyzine 10 mg 3 times a day as needed for anxiety.  Feels months she feels most of her anxiety and depression are situational and not a clinical mood disorder.   Review of Systems  Constitutional:  Negative for fever, malaise/fatigue and weight loss.  HENT: Negative.  Negative for nosebleeds.   Eyes: Negative.  Negative for blurred vision, double vision and photophobia.  Respiratory: Negative.  Negative for cough and shortness of breath.    Cardiovascular: Negative.  Negative for chest pain, palpitations and leg swelling.  Gastrointestinal: Negative.  Negative for heartburn, nausea and vomiting.  Musculoskeletal: Negative.  Negative for myalgias.  Neurological: Negative.  Negative for dizziness, focal weakness, seizures and headaches.  Psychiatric/Behavioral:  Positive for depression. Negative for suicidal ideas.     Past Medical History:  Diagnosis Date   Anemia    Anxiety    Dysrhythmia    palpitations   GERD (gastroesophageal reflux disease)    Headache(784.0)    Hypertension    SVD (spontaneous vaginal delivery)    x 3    Past Surgical History:  Procedure Laterality Date   ABDOMINAL HYSTERECTOMY N/A 03/23/2013   Procedure: HYSTERECTOMY ABDOMINAL;  Surgeon: Willodean Rosenthal, MD;  Location: [facility];  Service: Gynecology;  Laterality: N/A;   BILATERAL SALPINGECTOMY Bilateral 03/23/2013   Procedure: BILATERAL SALPINGECTOMY;  Surgeon: Willodean Rosenthal, MD;  Location: [facility];  Service: Gynecology;  Laterality: Bilateral;   COLONOSCOPY  08/2013   Pyrtle - polyp   COLPOSCOPY     FOOT SURGERY     right 5th toe-bone removed n1997   WISDOM TOOTH EXTRACTION      Family History  Problem Relation Age of Onset   Arthritis Mother    Stroke Mother    Hypertension Mother    Atrial fibrillation Mother    Arthritis Father    Cancer Father        prostate   Stroke Father    Alcohol abuse Brother    Stroke Brother    Cancer Paternal Aunt        breast and uterine   Colon cancer Neg Hx  Rectal cancer Neg Hx    Stomach cancer Neg Hx    Colon polyps Neg Hx    Esophageal cancer Neg Hx     Social History Reviewed with no changes to be made today.   Outpatient Medications Prior to Visit  Medication Sig Dispense Refill   aspirin EC 81 MG tablet Take 81 mg by mouth daily. Swallow whole.     citalopram (CELEXA) 10 MG tablet Take 1 tablet (10 mg total) by mouth daily. 90 tablet 1   hydrOXYzine (ATARAX) 10  MG tablet Take 1-2 tablets (10-20 mg total) by mouth 3 (three) times daily as needed. 60 tablet 1   lisinopril-hydrochlorothiazide (ZESTORETIC) 10-12.5 MG tablet TAKE 1 TABLET BY MOUTH DAILY. 90 tablet 1   meloxicam (MOBIC) 7.5 MG tablet Take 1 tablet (7.5 mg total) by mouth daily as needed for pain. 90 tablet 0   Multiple Vitamin (MULTIVITAMIN PO) Take 1 tablet by mouth daily.     No facility-administered medications prior to visit.    No Known Allergies     Objective:    BP 115/76 (BP Location: Left Arm, Patient Position: Sitting, Cuff Size: Large)   Pulse 77   Ht 5\' 2"  (1.575 m)   Wt 231 lb 9.6 oz (105.1 kg)   LMP  (LMP Unknown)   SpO2 99%   BMI 42.36 kg/m  Wt Readings from Last 3 Encounters:  12/23/22 231 lb 9.6 oz (105.1 kg)  11/12/22 235 lb (106.6 kg)  09/16/22 236 lb (107 kg)    Physical Exam Vitals and nursing note reviewed.  Constitutional:      Appearance: She is well-developed.  HENT:     Head: Normocephalic and atraumatic.  Cardiovascular:     Rate and Rhythm: Normal rate and regular rhythm.     Heart sounds: Normal heart sounds. No murmur heard.    No friction rub. No gallop.  Pulmonary:     Effort: Pulmonary effort is normal. No tachypnea or respiratory distress.     Breath sounds: Normal breath sounds. No decreased breath sounds, wheezing, rhonchi or rales.  Chest:     Chest wall: No tenderness.  Abdominal:     General: Bowel sounds are normal.     Palpations: Abdomen is soft.  Musculoskeletal:        General: Normal range of motion.     Cervical back: Normal range of motion.  Skin:    General: Skin is warm and dry.  Neurological:     Mental Status: She is alert and oriented to person, place, and time.     Coordination: Coordination normal.  Psychiatric:        Behavior: Behavior normal. Behavior is cooperative.        Thought Content: Thought content normal.        Judgment: Judgment normal.          Patient has been counseled extensively  about nutrition and exercise as well as the importance of adherence with medications and regular follow-up. The patient was given clear instructions to go to ER or return to medical center if symptoms don't improve, worsen or new problems develop. The patient verbalized understanding.   Follow-up: Return in about 3 months (around 03/25/2023).   Claiborne Rigg, FNP-BC [facility] [place], [place] [phone]   12/23/2022, 12:25 PM

## 2023-01-11 DIAGNOSIS — Z419 Encounter for procedure for purposes other than remedying health state, unspecified: Secondary | ICD-10-CM | POA: Diagnosis not present

## 2023-01-13 ENCOUNTER — Other Ambulatory Visit: Payer: Self-pay

## 2023-01-14 ENCOUNTER — Other Ambulatory Visit (HOSPITAL_COMMUNITY): Payer: Self-pay

## 2023-01-14 ENCOUNTER — Other Ambulatory Visit: Payer: Self-pay

## 2023-01-14 ENCOUNTER — Encounter (HOSPITAL_COMMUNITY): Payer: Self-pay

## 2023-01-16 ENCOUNTER — Other Ambulatory Visit: Payer: Self-pay

## 2023-02-10 DIAGNOSIS — Z419 Encounter for procedure for purposes other than remedying health state, unspecified: Secondary | ICD-10-CM | POA: Diagnosis not present

## 2023-02-11 ENCOUNTER — Other Ambulatory Visit: Payer: Self-pay

## 2023-02-11 ENCOUNTER — Other Ambulatory Visit (HOSPITAL_COMMUNITY): Payer: Self-pay

## 2023-02-13 ENCOUNTER — Other Ambulatory Visit: Payer: Self-pay

## 2023-03-10 ENCOUNTER — Ambulatory Visit: Payer: 59 | Admitting: Physician Assistant

## 2023-03-12 ENCOUNTER — Other Ambulatory Visit: Payer: Self-pay | Admitting: Nurse Practitioner

## 2023-03-12 ENCOUNTER — Other Ambulatory Visit: Payer: Self-pay

## 2023-03-12 DIAGNOSIS — G8929 Other chronic pain: Secondary | ICD-10-CM

## 2023-03-12 MED ORDER — MELOXICAM 7.5 MG PO TABS
7.5000 mg | ORAL_TABLET | Freq: Every day | ORAL | 0 refills | Status: DC
  Filled 2023-03-12: qty 30, 30d supply, fill #0
  Filled 2023-04-12: qty 30, 30d supply, fill #1
  Filled 2023-05-13: qty 30, 30d supply, fill #2

## 2023-03-13 DIAGNOSIS — Z419 Encounter for procedure for purposes other than remedying health state, unspecified: Secondary | ICD-10-CM | POA: Diagnosis not present

## 2023-03-16 ENCOUNTER — Other Ambulatory Visit: Payer: Self-pay

## 2023-03-30 ENCOUNTER — Encounter: Payer: Self-pay | Admitting: Nurse Practitioner

## 2023-03-30 ENCOUNTER — Other Ambulatory Visit: Payer: Self-pay

## 2023-03-30 ENCOUNTER — Ambulatory Visit: Payer: 59 | Attending: Nurse Practitioner | Admitting: Nurse Practitioner

## 2023-03-30 ENCOUNTER — Ambulatory Visit: Payer: 59 | Admitting: Nurse Practitioner

## 2023-03-30 VITALS — BP 129/80 | HR 80 | Ht 62.0 in | Wt 238.4 lb

## 2023-03-30 DIAGNOSIS — Z1231 Encounter for screening mammogram for malignant neoplasm of breast: Secondary | ICD-10-CM | POA: Diagnosis not present

## 2023-03-30 DIAGNOSIS — I10 Essential (primary) hypertension: Secondary | ICD-10-CM | POA: Diagnosis not present

## 2023-03-30 DIAGNOSIS — Z139 Encounter for screening, unspecified: Secondary | ICD-10-CM

## 2023-03-30 MED ORDER — LISINOPRIL-HYDROCHLOROTHIAZIDE 10-12.5 MG PO TABS
1.0000 | ORAL_TABLET | Freq: Every day | ORAL | 1 refills | Status: DC
Start: 2023-03-30 — End: 2023-07-28
  Filled 2023-03-30: qty 90, 90d supply, fill #0
  Filled 2023-04-12: qty 30, 30d supply, fill #0
  Filled 2023-05-13: qty 30, 30d supply, fill #1
  Filled 2023-06-11: qty 30, 30d supply, fill #2
  Filled 2023-07-14: qty 30, 30d supply, fill #3

## 2023-03-30 NOTE — Progress Notes (Signed)
Assessment & Plan:  [name] was seen today for medical management of chronic issues.  Diagnoses and all orders for this visit:  Primary hypertension -     lisinopril-hydrochlorothiazide (ZESTORETIC) 10-12.5 MG tablet; TAKE 1 TABLET BY MOUTH DAILY. Continue all antihypertensives as prescribed.  Reminded to bring in blood pressure log for follow  up appointment.  RECOMMENDATIONS: DASH/Mediterranean Diets are healthier choices for HTN.    Encounter for screening mammogram for malignant neoplasm of breast -     MM 3D SCREENING MAMMOGRAM BILATERAL BREAST; Future  Encounter for screening involving social determinants of health (SDoH) -     AMB Referral [facility] Care Management    Patient has been counseled on age-appropriate routine health concerns for screening and prevention. These are reviewed and up-to-date. Referrals have been placed accordingly. Immunizations are up-to-date or declined.    Subjective:   Chief Complaint  Patient presents with   Medical Management of Chronic Issues    Ann [name] 62 y.o. female presents to office today for for follow-up to hypertension.  She is doing well physically today.  Having some trouble sleeping at night.  We did discuss natural alternatives including chamomile, ashwaghanda, melatonin.  She has a past medical history of Anemia, Anxiety, Dysrhythmia, GERD, Headache(784.0), Hypertension, and SVD    Blood pressure is well-controlled with Zestoretic 10-12.5 mg daily BP Readings from Last 3 Encounters:  03/30/23 129/80  12/23/22 115/76  11/12/22 122/81     Review of Systems  Constitutional:  Negative for fever, malaise/fatigue and weight loss.  HENT: Negative.  Negative for nosebleeds.   Eyes: Negative.  Negative for blurred vision, double vision and photophobia.  Respiratory: Negative.  Negative for cough and shortness of breath.   Cardiovascular: Negative.  Negative for chest pain, palpitations and leg swelling.  Gastrointestinal:  Negative.  Negative for heartburn, nausea and vomiting.  Musculoskeletal: Negative.  Negative for myalgias.  Neurological: Negative.  Negative for dizziness, focal weakness, seizures and headaches.  Psychiatric/Behavioral: Negative.  Negative for suicidal ideas.     Past Medical History:  Diagnosis Date   Anemia    Anxiety    Dysrhythmia    palpitations   GERD (gastroesophageal reflux disease)    Headache(784.0)    Hypertension    SVD (spontaneous vaginal delivery)    x 3    Past Surgical History:  Procedure Laterality Date   ABDOMINAL HYSTERECTOMY N/A 03/23/2013   Procedure: HYSTERECTOMY ABDOMINAL;  Surgeon: Willodean Rosenthal, MD;  Location: [facility];  Service: Gynecology;  Laterality: N/A;   BILATERAL SALPINGECTOMY Bilateral 03/23/2013   Procedure: BILATERAL SALPINGECTOMY;  Surgeon: Willodean Rosenthal, MD;  Location: [facility];  Service: Gynecology;  Laterality: Bilateral;   COLONOSCOPY  08/2013   Pyrtle - polyp   COLPOSCOPY     FOOT SURGERY     right 5th toe-bone removed n1997   WISDOM TOOTH EXTRACTION      Family History  Problem Relation Age of Onset   Arthritis Mother    Stroke Mother    Hypertension Mother    Atrial fibrillation Mother    Arthritis Father    Cancer Father        prostate   Stroke Father    Alcohol abuse Brother    Stroke Brother    Cancer Paternal Aunt        breast and uterine   Colon cancer Neg Hx    Rectal cancer Neg Hx    Stomach cancer Neg Hx    Colon polyps Neg  Hx    Esophageal cancer Neg Hx     Social History Reviewed with no changes to be made today.   Outpatient Medications Prior to Visit  Medication Sig Dispense Refill   aspirin EC 81 MG tablet Take 81 mg by mouth daily. Swallow whole.     citalopram (CELEXA) 10 MG tablet Take 1 tablet (10 mg total) by mouth daily. 90 tablet 1   hydrOXYzine (ATARAX) 10 MG tablet Take 1-2 tablets (10-20 mg total) by mouth 3 (three) times daily as needed. 60 tablet 1   meloxicam (MOBIC)  7.5 MG tablet Take 1 tablet (7.5 mg total) by mouth daily as needed for pain. 90 tablet 0   Multiple Vitamin (MULTIVITAMIN PO) Take 1 tablet by mouth daily.     lisinopril-hydrochlorothiazide (ZESTORETIC) 10-12.5 MG tablet TAKE 1 TABLET BY MOUTH DAILY. 90 tablet 1   No facility-administered medications prior to visit.    No Known Allergies     Objective:    BP 129/80   Pulse 80   Ht 5\' 2"  (1.575 m)   Wt 238 lb 6.4 oz (108.1 kg)   LMP  (LMP Unknown)   SpO2 100%   BMI 43.60 kg/m  Wt Readings from Last 3 Encounters:  03/30/23 238 lb 6.4 oz (108.1 kg)  12/23/22 231 lb 9.6 oz (105.1 kg)  11/12/22 235 lb (106.6 kg)    Physical Exam Vitals and nursing note reviewed.  Constitutional:      Appearance: She is well-developed.  HENT:     Head: Normocephalic and atraumatic.  Cardiovascular:     Rate and Rhythm: Normal rate and regular rhythm.     Heart sounds: Normal heart sounds. No murmur heard.    No friction rub. No gallop.  Pulmonary:     Effort: Pulmonary effort is normal. No tachypnea or respiratory distress.     Breath sounds: Normal breath sounds. No decreased breath sounds, wheezing, rhonchi or rales.  Chest:     Chest wall: No tenderness.  Abdominal:     General: Bowel sounds are normal.     Palpations: Abdomen is soft.  Musculoskeletal:        General: Normal range of motion.     Cervical back: Normal range of motion.  Skin:    General: Skin is warm and dry.  Neurological:     Mental Status: She is alert and oriented to person, place, and time.     Coordination: Coordination normal.  Psychiatric:        Behavior: Behavior normal. Behavior is cooperative.        Thought Content: Thought content normal.        Judgment: Judgment normal.          Patient has been counseled extensively about nutrition and exercise as well as the importance of adherence with medications and regular follow-up. The patient was given clear instructions to go to ER or return to  medical center if symptoms don't improve, worsen or new problems develop. The patient verbalized understanding.   Follow-up: Return in about 3 months (around 06/30/2023) for labs HTN.   Claiborne Rigg, FNP-BC [facility] [place], [place] [phone]   03/30/2023, 12:05 PM

## 2023-04-12 DIAGNOSIS — Z419 Encounter for procedure for purposes other than remedying health state, unspecified: Secondary | ICD-10-CM | POA: Diagnosis not present

## 2023-04-13 ENCOUNTER — Other Ambulatory Visit: Payer: Self-pay

## 2023-04-14 ENCOUNTER — Ambulatory Visit: Payer: 59 | Admitting: Physician Assistant

## 2023-04-15 ENCOUNTER — Other Ambulatory Visit: Payer: Self-pay | Admitting: Nurse Practitioner

## 2023-04-15 DIAGNOSIS — Z1231 Encounter for screening mammogram for malignant neoplasm of breast: Secondary | ICD-10-CM

## 2023-04-16 ENCOUNTER — Other Ambulatory Visit: Payer: Self-pay

## 2023-04-17 DIAGNOSIS — Z1231 Encounter for screening mammogram for malignant neoplasm of breast: Secondary | ICD-10-CM

## 2023-04-21 ENCOUNTER — Other Ambulatory Visit: Payer: Self-pay

## 2023-04-21 ENCOUNTER — Ambulatory Visit: Payer: 59 | Admitting: Physician Assistant

## 2023-04-21 NOTE — Patient Instructions (Signed)
Visit Information  Ann [name] was given information about Medicaid Managed Care team care coordination services as a part of their [facility] Medicaid benefit. Ann [name] verbally consented to engagement with the [facility] Managed Care team.   If you are experiencing a medical emergency, please call 911 or report to your local emergency department or urgent care.   If you have a non-emergency medical problem during routine business hours, please contact your provider's office and ask to speak with a nurse.   For questions related to your [facility] health plan, please call: [phone] or go here:[email or web address]  If you would like to schedule transportation through your [facility] plan, please call the following number at least 2 days in advance of your appointment: [phone].   You can also use the MTM portal or MTM mobile app to manage your rides. Reimbursement for transportation is available through [facility]! For the portal, please go to [email or web address]  Call the [facility] Crisis Line at [phone], at any time, 24 hours a day, 7 days a week. If you are in danger or need immediate medical attention call 911.  If you would like help to quit smoking, call 1-800-QUIT-NOW (618-634-4100) OR Espaol: 1-855-Djelo-Ya (4-132-440-1027) o para ms informacin haga clic aqu or Text READY to 253-664 to register via text  Ann [name] - following are the goals we discussed in your visit today:   Goals Addressed   None     Social Worker will follow up in 30 days.   [name], [name], MHA [facility]  Managed Medicaid Social Worker [id number]   Following is a copy of your plan of care:  There are no care plans that you recently modified to display for this patient.

## 2023-04-21 NOTE — Patient Outreach (Signed)
Medicaid Managed Care Social Work Note  04/21/2023 Name:  Ann [name] MRN:  [id number] DOB:  25-May-1960  Ann [name] is an 62 y.o. year old female who is a primary patient of Ann Rigg, NP.  The [facility] Managed Care Coordination team was consulted for assistance with:   housing  Ann [name] was given information about Medicaid Managed Care Coordination team services today. Ann [name] Patient agreed to services and verbal consent obtained.  Engaged with patient  for by telephone forinitial visit in response to referral for case management and/or care coordination services.   Assessments/Interventions:  Review of past medical history, allergies, medications, health status, including review of consultants reports, laboratory and other test data, was performed as part of comprehensive evaluation and provision of chronic care management services.  SDOH: (Social Determinant of Health) assessments and interventions performed: SDOH Interventions    Flowsheet Row Office Visit from 03/30/2023 in [facility] [place] - A Dept Of [place]. [facility] Office Visit from 12/23/2022 in [facility] - A Dept Of Eligha Bridegroom. [facility] Office Visit from 03/18/2022 in [facility] [place] - A Dept Of Eligha Bridegroom. [facility] Video Visit from 02/26/2021 in [facility] - A Dept Of Eligha Bridegroom. [facility] Office Visit from 01/25/2021 in [facility] [place] - A Dept Of Eligha Bridegroom. [facility] Office Visit from 02/22/2019 in [facility] [place] - A Dept Of Eligha Bridegroom. [facility]  SDOH Interventions        Housing Interventions AMB Referral -- -- -- -- --  Depression Interventions/Treatment  -- Medication Medication Referral to Psychiatry Medication Counseling  Financial Strain Interventions Intervention Not Indicated -- -- -- -- --  Physical Activity Interventions  Intervention Not Indicated -- -- -- -- --  Stress Interventions Other (Comment)  [AMB Referral] -- -- -- -- --  Social Connections Interventions Intervention Not Indicated -- -- -- -- --     BSW completed a telephone outreach with patient, she states she is currently living with her mother, disabled brother, daughter and grandson. Patient is not working and does not have any income. Patient states she has not worked in 2 years due to taking care of her parents but wants to go back to work. Patient states she would like housing information for her mother and brother. She states they want to live in an apartment together. Patient would like job board resources as well. BSW and patient agreed for resources to be emailed to email on file.   Advanced Directives Status:  Not addressed in this encounter.  Care Plan                 No Known Allergies  Medications Reviewed Today   Medications were not reviewed in this encounter     Patient Active Problem List   Diagnosis Date Noted   Body mass index (BMI) 40.0-44.9, adult (HCC) 06/18/2022   Palpitations 02/06/2014   Anxiety state 11/18/2013   Abdominal pain 08/19/2013   Atypical chest pain 02/17/2013   Essential hypertension, benign 12/24/2012   Neck muscle spasm 08/06/2012   Shoulder pain 08/06/2012   Headache 07/03/2010   Chest pain 07/03/2010   Elevated BP 07/03/2010    Conditions to be addressed/monitored per PCP order:   housing  There are no care plans that you recently modified to display for this patient.   Follow up:  Patient agrees to Care Plan and Follow-up.  Plan: The Managed Medicaid care management team will reach out to the patient again over the next 30 days.  Date/time of next scheduled Social Work care management/care coordination outreach:  05/22/23  [name], [name], [facility]  Managed [facility] Social Worker [phone]

## 2023-05-13 DIAGNOSIS — Z419 Encounter for procedure for purposes other than remedying health state, unspecified: Secondary | ICD-10-CM | POA: Diagnosis not present

## 2023-05-14 ENCOUNTER — Other Ambulatory Visit: Payer: Self-pay

## 2023-05-14 ENCOUNTER — Other Ambulatory Visit: Payer: Self-pay | Admitting: Nurse Practitioner

## 2023-05-14 DIAGNOSIS — Z1231 Encounter for screening mammogram for malignant neoplasm of breast: Secondary | ICD-10-CM

## 2023-05-15 ENCOUNTER — Other Ambulatory Visit: Payer: Self-pay

## 2023-05-22 ENCOUNTER — Other Ambulatory Visit: Payer: Self-pay

## 2023-05-22 NOTE — Patient Instructions (Signed)
  Medicaid Managed Care   Unsuccessful Outreach Note  05/22/2023 Name: Ann [name] MRN: [id number] DOB: 1960/07/22  Referred by: Theotis Haze ORN, NP Reason for referral : High Risk Managed Medicaid (MM social work unsuccessful telephone outreach )   An unsuccessful telephone outreach was attempted today. The patient was referred to the case management team for assistance with care management and care coordination.   Follow Up Plan: A HIPAA compliant phone message was left for the patient providing contact information and requesting a return call.   Thersia Delene ROBINS, MHA [facility]  Managed [facility] Social Worker [phone]

## 2023-05-22 NOTE — Patient Outreach (Signed)
  Medicaid Managed Care   Unsuccessful Outreach Note  05/22/2023 Name: Ann [name] MRN: [id number] DOB: 1960/07/22  Referred by: Theotis Haze ORN, NP Reason for referral : High Risk Managed Medicaid (MM social work unsuccessful telephone outreach )   An unsuccessful telephone outreach was attempted today. The patient was referred to the case management team for assistance with care management and care coordination.   Follow Up Plan: A HIPAA compliant phone message was left for the patient providing contact information and requesting a return call.   Thersia Delene ROBINS, MHA [facility]  Managed [facility] Social Worker [phone]

## 2023-05-28 ENCOUNTER — Ambulatory Visit
Admission: RE | Admit: 2023-05-28 | Discharge: 2023-05-28 | Disposition: A | Discharge status: Home / Self Care | Payer: Medicaid Other | Source: Ambulatory Visit

## 2023-05-28 DIAGNOSIS — Z1231 Encounter for screening mammogram for malignant neoplasm of breast: Secondary | ICD-10-CM | POA: Diagnosis not present

## 2023-06-02 ENCOUNTER — Encounter: Payer: Self-pay | Admitting: Nurse Practitioner

## 2023-06-11 ENCOUNTER — Other Ambulatory Visit: Payer: Self-pay

## 2023-06-13 DIAGNOSIS — Z419 Encounter for procedure for purposes other than remedying health state, unspecified: Secondary | ICD-10-CM | POA: Diagnosis not present

## 2023-06-15 ENCOUNTER — Other Ambulatory Visit: Payer: Self-pay

## 2023-06-22 ENCOUNTER — Telehealth: Payer: Self-pay

## 2023-06-22 NOTE — Progress Notes (Signed)
.   Medicaid Managed Care   Unsuccessful Outreach Note  06/22/2023 Name: Ann [name] MRN: [id number] DOB: 04/08/1961  Referred by: Collins Dean, NP Reason for referral : High Risk Managed Medicaid   A second unsuccessful telephone outreach was attempted today. The patient was referred to the case management team for assistance with care management and care coordination.   Follow Up Plan: A HIPAA compliant phone message was left for the patient providing contact information and requesting a return call.  The care management team will reach out to the patient again over the next 7 days.   Emily Harding [facility], [facility] Guide Direct Dial: [phone]  Fax: [phone]

## 2023-06-27 ENCOUNTER — Other Ambulatory Visit: Payer: Self-pay | Admitting: Family Medicine

## 2023-06-27 DIAGNOSIS — G8929 Other chronic pain: Secondary | ICD-10-CM

## 2023-06-29 ENCOUNTER — Other Ambulatory Visit: Payer: Self-pay

## 2023-06-29 MED ORDER — MELOXICAM 7.5 MG PO TABS
7.5000 mg | ORAL_TABLET | Freq: Every day | ORAL | 0 refills | Status: DC
Start: 2023-06-29 — End: 2023-07-28
  Filled 2023-06-29: qty 30, 30d supply, fill #0

## 2023-06-30 ENCOUNTER — Other Ambulatory Visit: Payer: Self-pay

## 2023-06-30 ENCOUNTER — Ambulatory Visit: Payer: 59 | Admitting: Nurse Practitioner

## 2023-07-02 ENCOUNTER — Telehealth: Payer: Self-pay

## 2023-07-02 NOTE — Progress Notes (Signed)
..   Medicaid Managed Care   Unsuccessful Outreach Note  07/02/2023 Name: Ann [name] MRN: [id number] DOB: [birth year]  Referred by: Claiborne Rigg, NP Reason for referral : High Risk Managed Medicaid   Third unsuccessful telephone outreach was attempted today. The patient was referred to the case management team for assistance with care management and care coordination. The patient's primary care provider has been notified of our unsuccessful attempts to make or maintain contact with the patient. The care management team is pleased to engage with this patient at any time in the future should he/she be interested in assistance from the care management team.   Follow Up Plan: We have been unable to make contact with the patient for follow up. The care management team is available to follow up with the patient after provider conversation with the patient regarding recommendation for care management engagement and subsequent re-referral to the care management team.   Weston Settle [facility], [facility] Guide Direct Dial: [phone]  Fax: [phone]

## 2023-07-11 DIAGNOSIS — Z419 Encounter for procedure for purposes other than remedying health state, unspecified: Secondary | ICD-10-CM | POA: Diagnosis not present

## 2023-07-17 ENCOUNTER — Other Ambulatory Visit: Payer: Self-pay

## 2023-07-28 ENCOUNTER — Encounter: Payer: Self-pay | Admitting: Nurse Practitioner

## 2023-07-28 ENCOUNTER — Ambulatory Visit: Payer: 59 | Attending: Nurse Practitioner | Admitting: Nurse Practitioner

## 2023-07-28 ENCOUNTER — Other Ambulatory Visit: Payer: Self-pay

## 2023-07-28 ENCOUNTER — Other Ambulatory Visit: Payer: Self-pay | Admitting: Nurse Practitioner

## 2023-07-28 VITALS — BP 115/77 | HR 68 | Resp 20 | Ht 62.0 in | Wt 242.0 lb

## 2023-07-28 DIAGNOSIS — R7309 Other abnormal glucose: Secondary | ICD-10-CM

## 2023-07-28 DIAGNOSIS — F32A Depression, unspecified: Secondary | ICD-10-CM | POA: Diagnosis not present

## 2023-07-28 DIAGNOSIS — I10 Essential (primary) hypertension: Secondary | ICD-10-CM

## 2023-07-28 DIAGNOSIS — Z6841 Body Mass Index (BMI) 40.0 and over, adult: Secondary | ICD-10-CM | POA: Diagnosis not present

## 2023-07-28 DIAGNOSIS — M25561 Pain in right knee: Secondary | ICD-10-CM | POA: Diagnosis not present

## 2023-07-28 DIAGNOSIS — M25562 Pain in left knee: Secondary | ICD-10-CM

## 2023-07-28 DIAGNOSIS — R7989 Other specified abnormal findings of blood chemistry: Secondary | ICD-10-CM | POA: Diagnosis not present

## 2023-07-28 DIAGNOSIS — G8929 Other chronic pain: Secondary | ICD-10-CM

## 2023-07-28 DIAGNOSIS — E785 Hyperlipidemia, unspecified: Secondary | ICD-10-CM

## 2023-07-28 DIAGNOSIS — F419 Anxiety disorder, unspecified: Secondary | ICD-10-CM

## 2023-07-28 MED ORDER — WEGOVY 0.25 MG/0.5ML ~~LOC~~ SOAJ
0.2500 mg | SUBCUTANEOUS | 1 refills | Status: DC
  Filled 2023-07-28: qty 2, 28d supply, fill #0
  Filled 2023-08-23: qty 2, 28d supply, fill #1

## 2023-07-28 MED ORDER — CITALOPRAM HYDROBROMIDE 10 MG PO TABS
10.0000 mg | ORAL_TABLET | Freq: Every day | ORAL | 1 refills | Status: DC
  Filled 2023-07-28 – 2023-08-13 (×2): qty 90, 90d supply, fill #0
  Filled 2023-11-10: qty 90, 90d supply, fill #1

## 2023-07-28 MED ORDER — LISINOPRIL-HYDROCHLOROTHIAZIDE 10-12.5 MG PO TABS
1.0000 | ORAL_TABLET | Freq: Every day | ORAL | 1 refills | Status: DC
  Filled 2023-07-28 – 2023-08-13 (×2): qty 90, 90d supply, fill #0
  Filled 2023-11-10: qty 90, 90d supply, fill #1

## 2023-07-28 MED ORDER — MELOXICAM 7.5 MG PO TABS
7.5000 mg | ORAL_TABLET | Freq: Every day | ORAL | 1 refills | Status: AC
  Filled 2023-07-28: qty 30, 30d supply, fill #0
  Filled 2023-08-27: qty 30, 30d supply, fill #1
  Filled 2024-01-25: qty 30, 30d supply, fill #2
  Filled 2024-02-05: qty 30, 30d supply, fill #0
  Filled 2024-02-05: qty 30, 30d supply, fill #2
  Filled 2024-04-21: qty 30, 30d supply, fill #1

## 2023-07-28 NOTE — Addendum Note (Signed)
 Addended by: Bertram Denver on: 07/28/2023 05:06 PM   Modules accepted: Orders

## 2023-07-28 NOTE — Progress Notes (Addendum)
 Assessment & Plan:  Ann [name] was seen today for medical management of chronic issues and weight loss.  Diagnoses and all orders for this visit:  Primary hypertension -     lisinopril-hydrochlorothiazide (ZESTORETIC) 10-12.5 MG tablet; TAKE 1 TABLET BY MOUTH DAILY. -     CMP14+EGFR Continue all antihypertensives as prescribed.  Reminded to bring in blood pressure log for follow  up appointment.  RECOMMENDATIONS: DASH/Mediterranean Diets are healthier choices for HTN.    Chronic pain of both knees -     meloxicam (MOBIC) 7.5 MG tablet; Take 1 tablet (7.5 mg total) by mouth daily. Always take with food Work on losing weight to help reduce joint pain. May alternate with heat and ice application for pain relief. May also alternate with acetaminophen and Ibuprofen as prescribed pain relief. Other alternatives include massage, acupuncture and water aerobics.    Anxiety and depression She notes improved mood stability with taking celexa -     citalopram (CELEXA) 10 MG tablet; Take 1 tablet (10 mg total) by mouth daily.  Elevated glucose -     Hemoglobin A1c  Abnormal CBC -     CBC with Differential  Dyslipidemia, goal LDL below 100 -     Lipid panel    Patient has been counseled on age-appropriate routine health concerns for screening and prevention. These are reviewed and up-to-date. Referrals have been placed accordingly. Immunizations are up-to-date or declined.    Subjective:   Chief Complaint  Patient presents with   Medical Management of Chronic Issues   Weight Loss    Ann [name] 63 y.o. female presents to office today for follow up to HTN  She has a past medical history of Anemia, Anxiety, Dysrhythmia, GERD, Headache Hypertension, and Hysterectomy(2015)   HTN Blood pressure is well controlled today. She takes her blood pressure medication daily as prescribed BP Readings from Last 3 Encounters:  07/28/23 115/77  03/30/23 129/80  12/23/22 115/76      Morbid  Obesity Ms. Ann [name] is morbidly obese with a BMI of 44. She is requesting weight loss medication today. She has been trying to lose weight by increasing her raw vegetable intake. She endorses issues with craving unhealthy foods and eating late at night. Does not have much energy to walk due to depression and lack of motivation. She also has B/L knee OA for which she takes meloxicam.  We are requesting WEGOVY for weight management today.  The patient is new to therapy and is 63 years old. She has a co morbid history of : Primary hypertension for which we are managing with an antihypertensive.  The patient is currently on and will continue lifestyle modification including structured nutrition at the time GLP1 therapy commences and she will NOT be using the requested agent in combination with another GLP-1  She does NOT have any FDA-labeled contraindications to the requested agent, including history of medullary thyroid cancer or  multiple endocrine neoplasia type II Current weight loss activities she has implemented include: Reduced caloric intake and eating more raw vegetables.    Wt Readings from Last 3 Encounters:  07/28/23 242 lb (109.8 kg)  03/30/23 238 lb 6.4 oz (108.1 kg)  12/23/22 231 lb 9.6 oz (105.1 kg)    Review of Systems  Constitutional:  Negative for fever, malaise/fatigue and weight loss.  HENT: Negative.  Negative for nosebleeds.   Eyes: Negative.  Negative for blurred vision, double vision and photophobia.  Respiratory: Negative.  Negative for cough and shortness  of breath.   Cardiovascular: Negative.  Negative for chest pain, palpitations and leg swelling.  Gastrointestinal: Negative.  Negative for heartburn, nausea and vomiting.  Musculoskeletal: Negative.  Negative for myalgias.  Neurological: Negative.  Negative for dizziness, focal weakness, seizures and headaches.  Psychiatric/Behavioral:  Positive for depression. Negative for suicidal ideas.     Past Medical  History:  Diagnosis Date   Anemia    Anxiety    Dysrhythmia    palpitations   GERD (gastroesophageal reflux disease)    Headache(784.0)    Hypertension    SVD (spontaneous vaginal delivery)    x 3    Past Surgical History:  Procedure Laterality Date   ABDOMINAL HYSTERECTOMY N/A 03/23/2013   Procedure: HYSTERECTOMY ABDOMINAL;  Surgeon: Willodean Rosenthal, MD;  Location: [facility];  Service: Gynecology;  Laterality: N/A;   BILATERAL SALPINGECTOMY Bilateral 03/23/2013   Procedure: BILATERAL SALPINGECTOMY;  Surgeon: Willodean Rosenthal, MD;  Location: [facility];  Service: Gynecology;  Laterality: Bilateral;   COLONOSCOPY  08/2013   Pyrtle - polyp   COLPOSCOPY     FOOT SURGERY     right 5th toe-bone removed n1997   WISDOM TOOTH EXTRACTION      Family History  Problem Relation Age of Onset   Arthritis Mother    Stroke Mother    Hypertension Mother    Atrial fibrillation Mother    Arthritis Father    Cancer Father        prostate   Stroke Father    Alcohol abuse Brother    Stroke Brother    Cancer Paternal Aunt        breast and uterine   Colon cancer Neg Hx    Rectal cancer Neg Hx    Stomach cancer Neg Hx    Colon polyps Neg Hx    Esophageal cancer Neg Hx     Social History Reviewed with no changes to be made today.   Outpatient Medications Prior to Visit  Medication Sig Dispense Refill   aspirin EC 81 MG tablet Take 81 mg by mouth daily. Swallow whole.     hydrOXYzine (ATARAX) 10 MG tablet Take 1-2 tablets (10-20 mg total) by mouth 3 (three) times daily as needed. 60 tablet 1   Multiple Vitamin (MULTIVITAMIN PO) Take 1 tablet by mouth daily.     citalopram (CELEXA) 10 MG tablet Take 1 tablet (10 mg total) by mouth daily. 90 tablet 1   lisinopril-hydrochlorothiazide (ZESTORETIC) 10-12.5 MG tablet TAKE 1 TABLET BY MOUTH DAILY. 90 tablet 1   meloxicam (MOBIC) 7.5 MG tablet Take 1 tablet (7.5 mg total) by mouth daily as needed for pain. 90 tablet 0   No  facility-administered medications prior to visit.    No Known Allergies     Objective:    BP 115/77 (BP Location: Left Arm, Patient Position: Sitting, Cuff Size: Large)   Pulse 68   Resp 20   Ht 5\' 2"  (1.575 m)   Wt 242 lb (109.8 kg)   LMP  (LMP Unknown)   SpO2 100%   BMI 44.26 kg/m  Wt Readings from Last 3 Encounters:  07/28/23 242 lb (109.8 kg)  03/30/23 238 lb 6.4 oz (108.1 kg)  12/23/22 231 lb 9.6 oz (105.1 kg)    Physical Exam Vitals and nursing note reviewed.  Constitutional:      Appearance: She is well-developed.  HENT:     Head: Normocephalic and atraumatic.  Cardiovascular:     Rate and Rhythm: Normal rate and  regular rhythm.     Heart sounds: Normal heart sounds. No murmur heard.    No friction rub. No gallop.  Pulmonary:     Effort: Pulmonary effort is normal. No tachypnea or respiratory distress.     Breath sounds: Normal breath sounds. No decreased breath sounds, wheezing, rhonchi or rales.  Chest:     Chest wall: No tenderness.  Abdominal:     General: Bowel sounds are normal.     Palpations: Abdomen is soft.  Musculoskeletal:        General: Normal range of motion.     Cervical back: Normal range of motion.  Skin:    General: Skin is warm and dry.  Neurological:     Mental Status: She is alert and oriented to person, place, and time.     Coordination: Coordination normal.  Psychiatric:        Behavior: Behavior normal. Behavior is cooperative.        Thought Content: Thought content normal.        Judgment: Judgment normal.          Patient has been counseled extensively about nutrition and exercise as well as the importance of adherence with medications and regular follow-up. The patient was given clear instructions to go to ER or return to medical center if symptoms don't improve, worsen or new problems develop. The patient verbalized understanding.   Follow-up: Return in about 3 months (around 10/28/2023).   Claiborne Rigg,  FNP-BC [facility] and [facility] [place], [place] [phone]   07/28/2023, 10:25 AM

## 2023-07-29 ENCOUNTER — Other Ambulatory Visit: Payer: Self-pay

## 2023-07-29 LAB — CMP14+EGFR
ALT: 15 IU/L (ref 0–32)
AST: 19 IU/L (ref 0–40)
Albumin: 4.2 g/dL (ref 3.9–4.9)
Alkaline Phosphatase: 93 IU/L (ref 44–121)
BUN/Creatinine Ratio: 19 (ref 12–28)
BUN: 16 mg/dL (ref 8–27)
Bilirubin Total: 0.3 mg/dL (ref 0.0–1.2)
CO2: 23 mmol/L (ref 20–29)
Calcium: 9.7 mg/dL (ref 8.7–10.3)
Chloride: 101 mmol/L (ref 96–106)
Creatinine, Ser: 0.86 mg/dL (ref 0.57–1.00)
Globulin, Total: 2.8 g/dL (ref 1.5–4.5)
Glucose: 96 mg/dL (ref 70–99)
Potassium: 4.8 mmol/L (ref 3.5–5.2)
Sodium: 138 mmol/L (ref 134–144)
Total Protein: 7 g/dL (ref 6.0–8.5)
eGFR: 77 mL/min/{1.73_m2} (ref >59)

## 2023-07-29 LAB — CBC WITH DIFFERENTIAL/PLATELET
Basophils Absolute: 0 10*3/uL (ref 0.0–0.2)
Basos: 0 %
EOS (ABSOLUTE): 0.2 10*3/uL (ref 0.0–0.4)
Eos: 4 %
Hematocrit: 45.1 % (ref 34.0–46.6)
Hemoglobin: 14.6 g/dL (ref 11.1–15.9)
Immature Grans (Abs): 0 10*3/uL (ref 0.0–0.1)
Immature Granulocytes: 0 %
Lymphocytes Absolute: 2 10*3/uL (ref 0.7–3.1)
Lymphs: 35 %
MCH: 29 pg (ref 26.6–33.0)
MCHC: 32.4 g/dL (ref 31.5–35.7)
MCV: 90 fL (ref 79–97)
Monocytes Absolute: 0.5 10*3/uL (ref 0.1–0.9)
Monocytes: 9 %
Neutrophils Absolute: 3 10*3/uL (ref 1.4–7.0)
Neutrophils: 52 %
Platelets: 226 10*3/uL (ref 150–450)
RBC: 5.04 x10E6/uL (ref 3.77–5.28)
RDW: 14 % (ref 11.7–15.4)
WBC: 5.8 10*3/uL (ref 3.4–10.8)

## 2023-07-29 LAB — HEMOGLOBIN A1C
Est. average glucose Bld gHb Est-mCnc: 114 mg/dL
Hgb A1c MFr Bld: 5.6 % (ref 4.8–5.6)

## 2023-07-29 LAB — LIPID PANEL
Chol/HDL Ratio: 2.2 ratio (ref 0.0–4.4)
Cholesterol, Total: 181 mg/dL (ref 100–199)
HDL: 81 mg/dL (ref >39)
LDL Chol Calc (NIH): 86 mg/dL (ref 0–99)
Triglycerides: 73 mg/dL (ref 0–149)
VLDL Cholesterol Cal: 14 mg/dL (ref 5–40)

## 2023-07-30 ENCOUNTER — Other Ambulatory Visit: Payer: Self-pay

## 2023-07-31 ENCOUNTER — Other Ambulatory Visit: Payer: Self-pay

## 2023-08-02 ENCOUNTER — Encounter: Payer: Self-pay | Admitting: Nurse Practitioner

## 2023-08-13 ENCOUNTER — Other Ambulatory Visit: Payer: Self-pay

## 2023-08-13 ENCOUNTER — Other Ambulatory Visit (HOSPITAL_COMMUNITY): Payer: Self-pay

## 2023-08-14 ENCOUNTER — Other Ambulatory Visit (HOSPITAL_COMMUNITY): Payer: Self-pay

## 2023-08-22 DIAGNOSIS — Z419 Encounter for procedure for purposes other than remedying health state, unspecified: Secondary | ICD-10-CM | POA: Diagnosis not present

## 2023-08-23 ENCOUNTER — Encounter (HOSPITAL_COMMUNITY): Payer: Self-pay

## 2023-08-24 ENCOUNTER — Other Ambulatory Visit (HOSPITAL_COMMUNITY): Payer: Self-pay

## 2023-08-25 ENCOUNTER — Other Ambulatory Visit: Payer: Self-pay | Admitting: Nurse Practitioner

## 2023-08-25 ENCOUNTER — Other Ambulatory Visit: Payer: Self-pay

## 2023-08-25 MED ORDER — WEGOVY 0.5 MG/0.5ML ~~LOC~~ SOAJ
0.5000 mg | SUBCUTANEOUS | 2 refills | Status: DC
  Filled 2023-08-25: qty 2, 28d supply, fill #0
  Filled 2023-08-26: qty 2, fill #0
  Filled 2023-08-27: qty 2, 28d supply, fill #0

## 2023-08-25 NOTE — Telephone Encounter (Signed)
 Will forward to provider

## 2023-08-26 ENCOUNTER — Other Ambulatory Visit: Payer: Self-pay

## 2023-08-27 ENCOUNTER — Other Ambulatory Visit (HOSPITAL_COMMUNITY): Payer: Self-pay

## 2023-08-27 ENCOUNTER — Other Ambulatory Visit: Payer: Self-pay

## 2023-08-28 ENCOUNTER — Other Ambulatory Visit: Payer: Self-pay

## 2023-09-21 DIAGNOSIS — Z419 Encounter for procedure for purposes other than remedying health state, unspecified: Secondary | ICD-10-CM | POA: Diagnosis not present

## 2023-10-22 DIAGNOSIS — Z419 Encounter for procedure for purposes other than remedying health state, unspecified: Secondary | ICD-10-CM | POA: Diagnosis not present

## 2023-10-28 ENCOUNTER — Ambulatory Visit: Admitting: Nurse Practitioner

## 2023-11-04 ENCOUNTER — Ambulatory Visit: Admitting: Nurse Practitioner

## 2023-11-11 ENCOUNTER — Other Ambulatory Visit: Payer: Self-pay

## 2023-11-21 DIAGNOSIS — Z419 Encounter for procedure for purposes other than remedying health state, unspecified: Secondary | ICD-10-CM | POA: Diagnosis not present

## 2023-12-02 ENCOUNTER — Ambulatory Visit: Admitting: Nurse Practitioner

## 2023-12-22 DIAGNOSIS — Z419 Encounter for procedure for purposes other than remedying health state, unspecified: Secondary | ICD-10-CM | POA: Diagnosis not present

## 2023-12-28 ENCOUNTER — Ambulatory Visit: Admitting: Nurse Practitioner

## 2024-01-05 ENCOUNTER — Telehealth: Payer: Self-pay | Admitting: Nurse Practitioner

## 2024-01-05 NOTE — Telephone Encounter (Signed)
 Pt confirmed appt 8/26

## 2024-01-06 ENCOUNTER — Ambulatory Visit: Attending: Nurse Practitioner | Admitting: Nurse Practitioner

## 2024-01-06 ENCOUNTER — Other Ambulatory Visit: Payer: Self-pay

## 2024-01-06 DIAGNOSIS — F419 Anxiety disorder, unspecified: Secondary | ICD-10-CM

## 2024-01-06 DIAGNOSIS — Z6841 Body Mass Index (BMI) 40.0 and over, adult: Secondary | ICD-10-CM | POA: Diagnosis not present

## 2024-01-06 DIAGNOSIS — F32A Depression, unspecified: Secondary | ICD-10-CM | POA: Diagnosis not present

## 2024-01-06 DIAGNOSIS — I10 Essential (primary) hypertension: Secondary | ICD-10-CM | POA: Diagnosis not present

## 2024-01-06 MED ORDER — WEGOVY 0.5 MG/0.5ML ~~LOC~~ SOAJ
0.5000 mg | SUBCUTANEOUS | 2 refills | Status: DC
Start: 2024-02-05 — End: 2024-03-16
  Filled 2024-01-06 – 2024-02-05 (×2): qty 2, 28d supply, fill #0

## 2024-01-06 MED ORDER — LISINOPRIL-HYDROCHLOROTHIAZIDE 10-12.5 MG PO TABS
1.0000 | ORAL_TABLET | Freq: Every day | ORAL | 1 refills | Status: AC
  Filled 2024-01-06 – 2024-02-05 (×3): qty 90, 90d supply, fill #0
  Filled 2024-05-09: qty 90, 90d supply, fill #1

## 2024-01-06 MED ORDER — CITALOPRAM HYDROBROMIDE 10 MG PO TABS
10.0000 mg | ORAL_TABLET | Freq: Every day | ORAL | 1 refills | Status: DC
  Filled 2024-01-06 – 2024-02-05 (×3): qty 90, 90d supply, fill #0

## 2024-01-06 MED ORDER — WEGOVY 0.25 MG/0.5ML ~~LOC~~ SOAJ
0.2500 mg | SUBCUTANEOUS | 0 refills | Status: AC
  Filled 2024-01-06: qty 2, 28d supply, fill #0

## 2024-01-06 NOTE — Progress Notes (Signed)
 Assessment & Plan:  Ann [name] was seen today for hypertension.  Diagnoses and all orders for this visit:  Primary hypertension -     lisinopril -hydrochlorothiazide  (ZESTORETIC ) 10-12.5 MG tablet; TAKE 1 TABLET BY MOUTH DAILY. Continue all antihypertensives as prescribed.  Reminded to bring in blood pressure log for follow  up appointment.  RECOMMENDATIONS: DASH/Mediterranean Diets are healthier choices for HTN.    Anxiety and depression -     Ambulatory referral to Behavioral Health -     citalopram  (CELEXA ) 10 MG tablet; Take 1 tablet (10 mg total) by mouth daily.  Morbid obesity with BMI of 40.0-44.9, adult (HCC) -     semaglutide -weight management (WEGOVY ) 0.5 MG/0.5ML SOAJ SQ injection; Inject 0.5 mg into the skin once a week. -     semaglutide -weight management (WEGOVY ) 0.25 MG/0.5ML SOAJ SQ injection; Inject 0.25 mg into the skin once a week.    Patient has been counseled on age-appropriate routine health concerns for screening and prevention. These are reviewed and up-to-date. Referrals have been placed accordingly. Immunizations are up-to-date or declined.    Subjective:   Chief Complaint  Patient presents with   Hypertension    Ann [name] 63 y.o. female presents to office today for follow up to HTN  She has a past medical history of Anemia, Anxiety, Dysrhythmia, GERD (gastroesophageal reflux disease), Headache(784.0), Hypertension, and SVD   HTN Blood pressure is well controlled today. She takes zestoretic  10-12.5mg  daily.  BP Readings from Last 3 Encounters:  01/06/24 136/80  07/28/23 115/77  03/30/23 129/80     She is currently taking celexa  10 mg daily for anxiety and depression. Requesting referral to behavioral health for integrative therapy     Morbid Obesity Ms. Ann [name] is morbidly obese with a BMI of 44.92. She is requesting weight loss medication today. She has been trying to lose weight by increasing her raw vegetable intake. She endorses  issues with craving unhealthy foods and eating late at night. Does not have much energy to walk due to depression and lack of motivation. She also has B/L knee OA for which she takes meloxicam .  We are requesting WEGOVY  for weight management today.  The patient is new to therapy and is 63 years old. She has a co morbid history of : Primary hypertension for which we are managing with an antihypertensive.  The patient is currently on and will continue lifestyle modification including structured nutrition at the time GLP1 therapy commences and she will NOT be using the requested agent in combination with another GLP-1  She does NOT have any FDA-labeled contraindications to the requested agent, including history of medullary thyroid cancer or  multiple endocrine neoplasia type II Current weight loss activities she has implemented include: Reduced caloric intake and eating more raw vegetables.  Wt Readings from Last 3 Encounters:  01/06/24 245 lb 9.6 oz (111.4 kg)  07/28/23 242 lb (109.8 kg)  03/30/23 238 lb 6.4 oz (108.1 kg)      Review of Systems  Constitutional:  Negative for fever, malaise/fatigue and weight loss.  HENT: Negative.  Negative for nosebleeds.   Eyes: Negative.  Negative for blurred vision, double vision and photophobia.  Respiratory: Negative.  Negative for cough and shortness of breath.   Cardiovascular: Negative.  Negative for chest pain, palpitations and leg swelling.  Gastrointestinal: Negative.  Negative for heartburn, nausea and vomiting.  Musculoskeletal: Negative.  Negative for myalgias.  Neurological: Negative.  Negative for dizziness, focal weakness, seizures and headaches.  Psychiatric/Behavioral:  Positive for depression. Negative for suicidal ideas. The patient is nervous/anxious.     Past Medical History:  Diagnosis Date   Anemia    Anxiety    Dysrhythmia    palpitations   GERD (gastroesophageal reflux disease)    Headache(784.0)    Hypertension    SVD  (spontaneous vaginal delivery)    x 3    Past Surgical History:  Procedure Laterality Date   ABDOMINAL HYSTERECTOMY N/A 03/23/2013   Procedure: HYSTERECTOMY ABDOMINAL;  Surgeon: Elveria Mungo, MD;  Location: [facility];  Service: Gynecology;  Laterality: N/A;   BILATERAL SALPINGECTOMY Bilateral 03/23/2013   Procedure: BILATERAL SALPINGECTOMY;  Surgeon: Elveria Mungo, MD;  Location: [facility];  Service: Gynecology;  Laterality: Bilateral;   COLONOSCOPY  08/2013   Pyrtle - polyp   COLPOSCOPY     FOOT SURGERY     right 5th toe-bone removed n1997   WISDOM TOOTH EXTRACTION      Family History  Problem Relation Age of Onset   Arthritis Mother    Stroke Mother    Hypertension Mother    Atrial fibrillation Mother    Arthritis Father    Cancer Father        prostate   Stroke Father    Alcohol abuse Brother    Stroke Brother    Cancer Paternal Aunt        breast and uterine   Colon cancer Neg Hx    Rectal cancer Neg Hx    Stomach cancer Neg Hx    Colon polyps Neg Hx    Esophageal cancer Neg Hx     Social History Reviewed with no changes to be made today.   Outpatient Medications Prior to Visit  Medication Sig Dispense Refill   aspirin  EC 81 MG tablet Take 81 mg by mouth daily. Swallow whole.     hydrOXYzine  (ATARAX ) 10 MG tablet Take 1-2 tablets (10-20 mg total) by mouth 3 (three) times daily as needed. 60 tablet 1   meloxicam  (MOBIC ) 7.5 MG tablet Take 1 tablet (7.5 mg total) by mouth daily. Always take with food 90 tablet 1   Multiple Vitamin (MULTIVITAMIN PO) Take 1 tablet by mouth daily.     citalopram  (CELEXA ) 10 MG tablet Take 1 tablet (10 mg total) by mouth daily. 90 tablet 1   lisinopril -hydrochlorothiazide  (ZESTORETIC ) 10-12.5 MG tablet TAKE 1 TABLET BY MOUTH DAILY. 90 tablet 1   Semaglutide -Weight Management (WEGOVY ) 0.5 MG/0.5ML SOAJ Inject 0.5 mg into the skin once a week. 2 mL 2   No facility-administered medications prior to visit.    No Known  Allergies     Objective:    BP 136/80 (BP Location: Left Arm, Patient Position: Sitting, Cuff Size: Normal)   Pulse 71   Resp 18   Ht 5' 2 (1.575 m)   Wt 245 lb 9.6 oz (111.4 kg)   LMP  (LMP Unknown)   SpO2 99%   BMI 44.92 kg/m  Wt Readings from Last 3 Encounters:  01/06/24 245 lb 9.6 oz (111.4 kg)  07/28/23 242 lb (109.8 kg)  03/30/23 238 lb 6.4 oz (108.1 kg)    Physical Exam Vitals and nursing note reviewed.  Constitutional:      Appearance: She is well-developed.  HENT:     Head: Normocephalic and atraumatic.  Cardiovascular:     Rate and Rhythm: Normal rate and regular rhythm.     Heart sounds: Normal heart sounds. No murmur heard.    No friction rub. No  gallop.  Pulmonary:     Effort: Pulmonary effort is normal. No tachypnea or respiratory distress.     Breath sounds: Normal breath sounds. No decreased breath sounds, wheezing, rhonchi or rales.  Chest:     Chest wall: No tenderness.  Musculoskeletal:        General: Normal range of motion.     Cervical back: Normal range of motion.  Skin:    General: Skin is warm and dry.  Neurological:     Mental Status: She is alert and oriented to person, place, and time.     Coordination: Coordination normal.  Psychiatric:        Behavior: Behavior normal. Behavior is cooperative.        Thought Content: Thought content normal.        Judgment: Judgment normal.          Patient has been counseled extensively about nutrition and exercise as well as the importance of adherence with medications and regular follow-up. The patient was given clear instructions to go to ER or return to medical center if symptoms don't improve, worsen or new problems develop. The patient verbalized understanding.   Follow-up: Return in about 3 months (around 04/18/2024).   Haze LELON Servant, FNP-BC [facility] and [facility] [place], [place] [phone]   01/06/2024, 5:04 PM

## 2024-01-08 ENCOUNTER — Other Ambulatory Visit: Payer: Self-pay

## 2024-01-18 ENCOUNTER — Other Ambulatory Visit: Payer: Self-pay

## 2024-01-22 DIAGNOSIS — Z419 Encounter for procedure for purposes other than remedying health state, unspecified: Secondary | ICD-10-CM | POA: Diagnosis not present

## 2024-01-25 ENCOUNTER — Encounter: Payer: Self-pay | Admitting: Nurse Practitioner

## 2024-01-26 ENCOUNTER — Encounter: Payer: Self-pay | Admitting: Pharmacist

## 2024-01-26 ENCOUNTER — Other Ambulatory Visit: Payer: Self-pay

## 2024-01-27 NOTE — Telephone Encounter (Signed)
 Please give patient the office number for [facility] behavioral health.  If she would like to be sent to a different office we will be glad to send another referral.  She was previously seeing a Veterinary surgeon at [facility] behavioral health.

## 2024-01-29 ENCOUNTER — Other Ambulatory Visit: Payer: Self-pay

## 2024-02-05 ENCOUNTER — Encounter: Payer: Self-pay | Admitting: Nurse Practitioner

## 2024-02-05 ENCOUNTER — Encounter: Payer: Self-pay | Admitting: Pharmacist

## 2024-02-05 ENCOUNTER — Other Ambulatory Visit (HOSPITAL_COMMUNITY): Payer: Self-pay

## 2024-02-05 ENCOUNTER — Other Ambulatory Visit: Payer: Self-pay | Admitting: Nurse Practitioner

## 2024-02-05 ENCOUNTER — Other Ambulatory Visit: Payer: Self-pay

## 2024-02-08 ENCOUNTER — Other Ambulatory Visit: Payer: Self-pay

## 2024-02-10 ENCOUNTER — Other Ambulatory Visit: Payer: Self-pay

## 2024-02-16 ENCOUNTER — Other Ambulatory Visit: Payer: Self-pay

## 2024-03-07 NOTE — Progress Notes (Signed)
 Psychiatric Initial Adult Assessment  Patient Identification: Ann [name] MRN:  [id number] Date of Evaluation:  03/07/2024  Assessment: Patient presents to the [facility] due to ongoing symptoms of depression and generalized anxiety.  She has been taking Celexa  and as needed hydroxyzine  for the past 3 years to aid with her symptoms.  She does meet criteria for MDD with her persistently low mood, poor sleep, feelings of guilt, poor energy, poor concentration, and increased appetite.  A big cause of her symptoms stems from her being the primary caregiver of her mother.  She did notice benefit since being on the Celexa .  I discussed switching from Celexa  to Zoloft  due to risks of QTc prolongation with Celexa  and with that risk comes the limitation of further increasing the Celexa  past 20 mg if needed.   She also meets criteria for generalized anxiety disorder due to uncontrollable generalized anxiety with poor focus, irritability, muscle tension, and poor energy and sleep.  We discussed switching from hydroxyzine  to propranolol due to oversedation and hydroxyzine 's risk of cognitive impairment due to strong anticholinergic properties in older patients.  Discussed propranolol's mechanism of action and we will closely monitor her blood pressure.  Instructed patient to take the propranolol 30 minutes prior to the anxiety inducing event.  I have low concern that she will be intolerant of propranolol as she also has essential hypertension.  Patient can also benefit from establishing care with therapist however her time is limited as she has responsibilities caring for her mother.  Follow-up in 4-6 weeks.  Plan:  # MDD - Start Zoloft  25 mg daily - D/c Celexa  10 mg  # GAD - Start propranolol 10 mg TID PRN - D/c Atarax  10-20 mg TID PRN   Patient was given contact information for behavioral health clinic and was instructed to call 911 for emergencies.   Identifying Information: Ann [name] is a 63  y.o. female with a history of depression and anxiety who presents in person to [facility] for depression and anxiety.    Subjective:  History of Present Illness:    Patient seen alone. She states her PCP recommended for her to come to the clinic. She states her stressors are living with her mother. She states her father passed in 2023. She states she mother had a stroke in 2014 and has been a caregiver since. She has been out of work since 2022 due to caring for her mother and father. 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 She states her daughter also stresses her out and her daughter feels that she (the daughter) is bipolar. Pt doesn't like how daughter treats her grandson. She states her other daughter lives in [place]  and has not seen her since February and she feels guilty for not seeing her. She also feels that she should be closer to her siblings. She notes there being financial struggles at this time. She states she is coping through playing games on her phone. She states that she is an introverted so she does not hang out with her friends as much.   She feels like she has been feeling more sad for the past two years. She reports poor sleep, reporting dozing during the day - taking 30 minute naps twice in the day and sleeping 3 hours in the night. She states her mind is racing at night with worries. She notes drinking coffee as late as late as 3 PM.  She states that she is the one setting up her mother's health. She states her daughter doesn't help with her mother. She feels part of her issue is having to be on at night for her mother. She notes finding enjoyment in her hobbies of doing puzzles, listening to jazz music, and watching TV. She notes poor energy and states it is because she is always caring for her mother. She notes poor focus. She notes high appetite, stating she gained weight. She denies SI now and in the  past.  Patient denies current SI, HI, and AVH.    I discussed the risks/benefits/possible adverse effects of starting Zoloft  and propranolol.  She denies cocaine use and medical history of asthma.   Anxiety Symptoms: generalized uncontrollable anxiety; reporting poor focus, denies restlessness, irritability, muscle tension in the neck, poor energy and poor sleep Manic Symptoms: denies Psychosis Symptoms: denies  She notes starting psychotropic medications in 2022. She notes her Celexa  and Atarax  are fairly helpful and she states she can tell a difference with taking the medications daily. She states taking the hydroxyzine  about once a week but she notices it makes her sleepy. She states taking 5 mg of hydroxyzine .   Past Psychiatric History:  Diagnoses: depression and anxiety by PCP in 2022 Previous medications: denies Previous psychiatrist: denies Previous therapist: previously in 2022- virtual  Hospitalizations: denies Suicide attempts: denies SIB: denies Current access to guns: denies  Hx of violence towards others: denies Hx of trauma/abuse: denies  Substance use:  Tobacco: denies Alcohol: occasionally like every other week, 2 glass of wine at a time Marijuana: denies Other illicit substances: denies  Past Medical History:  Medications: ASA, Zestoretic  PCP: [facility]  Family Psychiatric History: none diagnosed  Social History:  Living: [place], living with mother and daughter and grandson Occupation: unemployed Relationship: single Children: 2 children Support: mother and children Legal History: denies  Past Medical History:  Past Medical History:  Diagnosis Date   Anemia    Anxiety    Dysrhythmia    palpitations   GERD (gastroesophageal reflux disease)    Headache(784.0)    Hypertension    SVD (spontaneous vaginal delivery)    x 3    Past Surgical History:  Procedure Laterality Date   ABDOMINAL HYSTERECTOMY N/A 03/23/2013   Procedure:  HYSTERECTOMY ABDOMINAL;  Surgeon: Elveria Mungo, MD;  Location: [facility];  Service: Gynecology;  Laterality: N/A;   BILATERAL SALPINGECTOMY Bilateral 03/23/2013   Procedure: BILATERAL SALPINGECTOMY;  Surgeon: Elveria Mungo, MD;  Location: [facility];  Service: Gynecology;  Laterality: Bilateral;   COLONOSCOPY  08/2013   Pyrtle - polyp   COLPOSCOPY     FOOT SURGERY     right 5th toe-bone removed n1997   WISDOM TOOTH EXTRACTION      Family History:  Family History  Problem Relation Age of Onset   Arthritis Mother    Stroke Mother    Hypertension Mother    Atrial fibrillation Mother    Arthritis Father    Cancer Father        prostate   Stroke Father    Alcohol abuse Brother    Stroke Brother    Cancer Paternal Aunt        breast and uterine   Colon cancer Neg Hx    Rectal cancer Neg Hx    Stomach cancer Neg Hx    Colon polyps Neg Hx    Esophageal cancer Neg Hx     Social History   Socioeconomic  History   Marital status: Single    Spouse name: Not on file   Number of children: Not on file   Years of education: Not on file   Highest education level: Some college, no degree  Occupational History   Not on file  Tobacco Use   Smoking status: Never   Smokeless tobacco: Never  Vaping Use   Vaping status: Never Used  Substance and Sexual Activity   Alcohol use: Yes    Alcohol/week: 7.0 standard drinks of alcohol    Types: 7 Glasses of wine per week    Comment: social drinker   Drug use: No   Sexual activity: Not Currently    Birth control/protection: Surgical    Comment: HYSTERECTOMY  Other Topics Concern   Not on file  Social History Narrative   Not on file   Social Drivers of Health   Financial Resource Strain: High Risk (01/06/2024)   Overall Financial Resource Strain (CARDIA)    Difficulty of Paying Living Expenses: Very hard  Food Insecurity: Food Insecurity Present (01/06/2024)   Hunger Vital Sign    Worried About Running Out of Food in the  Last Year: Often true    Ran Out of Food in the Last Year: Often true  Transportation Needs: No Transportation Needs (01/06/2024)   PRAPARE - Administrator, Civil Service (Medical): No    Lack of Transportation (Non-Medical): No  Physical Activity: Inactive (01/06/2024)   Exercise Vital Sign    Days of Exercise per Week: 0 days    Minutes of Exercise per Session: Not on file  Stress: Stress Concern Present (01/06/2024)   [employer] of Occupational Health - Occupational Stress Questionnaire    Feeling of Stress: Rather much  Social Connections: Socially Isolated (01/06/2024)   Social Connection and Isolation Panel    Frequency of Communication with Friends and Family: More than three times a week    Frequency of Social Gatherings with Friends and Family: Never    Attends Religious Services: Never    Database Administrator or Organizations: No    Attends Engineer, Structural: Not on file    Marital Status: Never married    Allergies: No Known Allergies  Current Medications: Current Outpatient Medications  Medication Sig Dispense Refill   aspirin  EC 81 MG tablet Take 81 mg by mouth daily. Swallow whole.     citalopram  (CELEXA ) 10 MG tablet Take 1 tablet (10 mg total) by mouth daily. 90 tablet 1   hydrOXYzine  (ATARAX ) 10 MG tablet Take 1-2 tablets (10-20 mg total) by mouth 3 (three) times daily as needed. 60 tablet 1   lisinopril -hydrochlorothiazide  (ZESTORETIC ) 10-12.5 MG tablet TAKE 1 TABLET BY MOUTH DAILY. 90 tablet 1   meloxicam  (MOBIC ) 7.5 MG tablet Take 1 tablet (7.5 mg total) by mouth daily. Always take with food 90 tablet 1   Multiple Vitamin (MULTIVITAMIN PO) Take 1 tablet by mouth daily.     semaglutide -weight management (WEGOVY ) 0.5 MG/0.5ML SOAJ SQ injection Inject 0.5 mg into the skin once a week. 2 mL 2   No current facility-administered medications for this visit.    Objective:  Psychiatric Specialty Exam General Appearance: appears at  stated age, casually dressed and groomed   Behavior: pleasant and cooperative   Psychomotor Activity: no psychomotor agitation or retardation noted   Eye Contact: fair  Speech: normal amount, tone, volume and fluency    Mood: euthymic  Affect: congruent, pleasant and interactive   Thought  Process: linear, goal directed, no circumstantial or tangential thought process noted, no racing thoughts or flight of ideas  Descriptions of Associations: intact   Thought Content Hallucinations: denies AH, VH , does not appear responding to stimuli  Delusions: no paranoia, delusions of control, grandeur, ideas of reference, thought broadcasting, and magical thinking  Suicidal Thoughts: denies SI, intention, plan  Homicidal Thoughts: denies HI, intention, plan   Alertness/Orientation: alert and fully oriented   Insight: fair Judgment: fair  Memory: intact   Executive Functions  Concentration: intact  Attention Span: fair  Recall: intact  Fund of Knowledge: fair   Physical Exam  General: Pleasant, well-appearing. No acute distress. Pulmonary: Normal effort. No wheezing or rales. Skin: No obvious rash or lesions. Neuro: A&Ox3.No focal deficit.   Review of Systems  No reported symptoms  Metabolic Disorder Labs: Lab Results  Component Value Date   HGBA1C 5.6 07/28/2023   MPG 105 11/17/2012   No results found for: PROLACTIN Lab Results  Component Value Date   CHOL 181 07/28/2023   TRIG 73 07/28/2023   HDL 81 07/28/2023   CHOLHDL 2.2 07/28/2023   VLDL 16 11/17/2012   LDLCALC 86 07/28/2023   LDLCALC 96 02/06/2022   Lab Results  Component Value Date   TSH 0.514 01/14/2018    Therapeutic Level Labs: No results found for: LITHIUM No results found for: CBMZ No results found for: VALPROATE  Screenings:  GAD-7    Flowsheet Row Office Visit from 07/28/2023 in [facility] [place] - A Dept Of Rodriguez Hevia. [facility] Office Visit from 03/30/2023  in [facility] [place] - A Dept Of Jolynn DEL. [facility] Office Visit from 12/23/2022 in [facility] - A Dept Of Jolynn DEL. [facility] Office Visit from 11/12/2022 in [facility] [place] - A Dept Of Jolynn DEL. [facility] Office Visit from 09/16/2022 in [facility] [place] - A Dept Of Jolynn DEL. [facility]  Total GAD-7 Score 13 11 11 16 12    PHQ2-9    Flowsheet Row Office Visit from 07/28/2023 in [facility] - A Dept Of Jolynn DEL. [facility] Office Visit from 03/30/2023 in [facility] [place] - A Dept Of Jolynn DEL. [facility] Office Visit from 12/23/2022 in [facility] [place] - A Dept Of Jolynn DEL. [facility] Office Visit from 11/12/2022 in [facility] - A Dept Of Jolynn DEL. [facility] Office Visit from 09/16/2022 in [facility] [place] - A Dept Of Jolynn DEL. [facility]  PHQ-2 Total Score 4 4 2 4 3   PHQ-9 Total Score 13 16 12 17 13    Flowsheet Row ED from 01/24/2021 in [facility] Emergency Department at [facility]  C-SSRS RISK CATEGORY No Risk    Collaboration of Care: Case discussed with attending, see attending's attestation for additional information.  Ismael Franco, MD PGY-3 Psychiatry Resident

## 2024-03-14 ENCOUNTER — Encounter: Payer: Self-pay | Admitting: Radiology

## 2024-03-16 ENCOUNTER — Other Ambulatory Visit (HOSPITAL_COMMUNITY): Payer: Self-pay

## 2024-03-16 ENCOUNTER — Other Ambulatory Visit: Payer: Self-pay

## 2024-03-16 ENCOUNTER — Ambulatory Visit (HOSPITAL_COMMUNITY): Admitting: Psychiatry

## 2024-03-16 VITALS — BP 131/77 | Wt 239.6 lb

## 2024-03-16 DIAGNOSIS — F411 Generalized anxiety disorder: Secondary | ICD-10-CM | POA: Diagnosis not present

## 2024-03-16 DIAGNOSIS — F331 Major depressive disorder, recurrent, moderate: Secondary | ICD-10-CM | POA: Diagnosis not present

## 2024-03-16 MED ORDER — PROPRANOLOL HCL 10 MG PO TABS
10.0000 mg | ORAL_TABLET | Freq: Two times a day (BID) | ORAL | 0 refills | Status: DC | PRN
  Filled 2024-03-16 (×2): qty 90, 45d supply, fill #0

## 2024-03-16 MED ORDER — SERTRALINE HCL 25 MG PO TABS
25.0000 mg | ORAL_TABLET | Freq: Every day | ORAL | 1 refills | Status: DC
  Filled 2024-03-16 (×2): qty 30, 30d supply, fill #0
  Filled 2024-04-21: qty 30, 30d supply, fill #1

## 2024-03-16 NOTE — Patient Instructions (Signed)
 Thank you for attending your appointment today.  -- stop celexa  and atarax  -- start zoloft  at 25 mg daily -- start propranolol 10 mg TID PRN -- Continue other medications as prescribed.  Please do not make any changes to medications without first discussing with your provider. If you are experiencing a psychiatric emergency, please call 911 or present to your nearest emergency department. Additional crisis, medication management, and therapy resources are included below.  [facility]  [address], [place], [place] [address] [phone] WALK-IN URGENT CARE 24/7 FOR ANYONE [address] [place], [place]  [phone] Fax: [phone] [email or web address] *Interpreters available *Accepts all insurance and uninsured for Urgent Care needs *Accepts Medicaid and uninsured for outpatient treatment (below)      ONLY FOR [facility]  Below:    Outpatient New Patient Assessment/Therapy Walk-ins:        Monday, Wednesday, and Thursday 8am until slots are full (first come, first served)                   New Patient Psychiatry/Medication Management        Monday-Friday 8am-11am (first come, first served)               For all walk-ins we ask that you arrive by 7:15am, because patients will be seen in the order of arrival.

## 2024-04-11 ENCOUNTER — Telehealth: Payer: Self-pay | Admitting: Nurse Practitioner

## 2024-04-11 ENCOUNTER — Ambulatory Visit: Admitting: Nurse Practitioner

## 2024-04-11 NOTE — Progress Notes (Deleted)
 Psychiatric Adult Assessment Progress Note  Patient Identification: Ann [name] MRN:  [id number] Date of Evaluation:  04/11/2024  Assessment: Patient presents for a follow-up appointment. In the prior appointment, the following changes were made: Celexa  discontinued and Zoloft  was started. Today, ***   Plan:  # MDD - Zoloft  25 mg daily  # GAD - Start propranolol  10 mg TID PRN  Patient was given contact information for behavioral health clinic and was instructed to call 911 for emergencies.   Identifying Information: Ann [name] is a 63 y.o. female with a history of depression and anxiety who presents in person to [facility] for depression and anxiety.    Subjective:  History of Present Illness:    Patient seen ***.  Patient reports feeling *** today. Since the previous visit, ***. Stressors include ***.   Regarding psychiatric symptoms, ***. Patient reports the medications are ***. Patient reports the following adverse effects: ***.   Patient reports *** sleep, ***. Patient reports *** appetite, ***.   Patient denies current SI, HI, and AVH. ***   Past Psychiatric History:  Diagnoses: depression and anxiety by PCP in 2022 Previous medications: hydroxyzine  (oversedation on 5 mg), Celexa  (did find  Previous psychiatrist: denies Previous therapist: previously in 2022- virtual  Hospitalizations: denies Suicide attempts: denies SIB: denies Current access to guns: denies  Hx of violence towards others: denies Hx of trauma/abuse: denies  Substance use:  Tobacco: denies Alcohol: occasionally like every other week, 2 glass of wine at a time Marijuana: denies Other illicit substances: denies  Past Medical History:  Medications: ASA, Zestoretic  PCP: Haze Servant  Family Psychiatric History: none diagnosed  Social History:  Living: [place], living with mother and daughter and grandson Occupation: unemployed Relationship:  single Children: 2 children Support: mother and children Legal History: denies  Past Medical History:  Past Medical History:  Diagnosis Date   Anemia    Anxiety    Dysrhythmia    palpitations   GERD (gastroesophageal reflux disease)    Headache(784.0)    Hypertension    SVD (spontaneous vaginal delivery)    x 3    Past Surgical History:  Procedure Laterality Date   ABDOMINAL HYSTERECTOMY N/A 03/23/2013   Procedure: HYSTERECTOMY ABDOMINAL;  Surgeon: Elveria Mungo, MD;  Location: [facility];  Service: Gynecology;  Laterality: N/A;   BILATERAL SALPINGECTOMY Bilateral 03/23/2013   Procedure: BILATERAL SALPINGECTOMY;  Surgeon: Elveria Mungo, MD;  Location: [facility];  Service: Gynecology;  Laterality: Bilateral;   COLONOSCOPY  08/2013   Pyrtle - polyp   COLPOSCOPY     FOOT SURGERY     right 5th toe-bone removed n1997   WISDOM TOOTH EXTRACTION      Family History:  Family History  Problem Relation Age of Onset   Arthritis Mother    Stroke Mother    Hypertension Mother    Atrial fibrillation Mother    Arthritis Father    Cancer Father        prostate   Stroke Father    Alcohol abuse Brother    Stroke Brother    Cancer Paternal Aunt        breast and uterine   Colon cancer Neg Hx    Rectal cancer Neg Hx    Stomach cancer Neg Hx    Colon polyps Neg Hx    Esophageal cancer Neg Hx     Social History   Socioeconomic History   Marital status: Single    Spouse name: Not on file  Number of children: Not on file   Years of education: Not on file   Highest education level: Some college, no degree  Occupational History   Not on file  Tobacco Use   Smoking status: Never   Smokeless tobacco: Never  Vaping Use   Vaping status: Never Used  Substance and Sexual Activity   Alcohol use: Yes    Alcohol/week: 7.0 standard drinks of alcohol    Types: 7 Glasses of wine per week    Comment: social drinker   Drug use: No   Sexual activity: Not Currently     Birth control/protection: Surgical    Comment: HYSTERECTOMY  Other Topics Concern   Not on file  Social History Narrative   Not on file   Social Drivers of Health   Financial Resource Strain: High Risk (01/06/2024)   Overall Financial Resource Strain (CARDIA)    Difficulty of Paying Living Expenses: Very hard  Food Insecurity: Food Insecurity Present (01/06/2024)   Hunger Vital Sign    Worried About Running Out of Food in the Last Year: Often true    Ran Out of Food in the Last Year: Often true  Transportation Needs: No Transportation Needs (01/06/2024)   PRAPARE - Administrator, Civil Service (Medical): No    Lack of Transportation (Non-Medical): No  Physical Activity: Inactive (01/06/2024)   Exercise Vital Sign    Days of Exercise per Week: 0 days    Minutes of Exercise per Session: Not on file  Stress: Stress Concern Present (01/06/2024)   [employer] of Occupational Health - Occupational Stress Questionnaire    Feeling of Stress: Rather much  Social Connections: Socially Isolated (01/06/2024)   Social Connection and Isolation Panel    Frequency of Communication with Friends and Family: More than three times a week    Frequency of Social Gatherings with Friends and Family: Never    Attends Religious Services: Never    Database Administrator or Organizations: No    Attends Engineer, Structural: Not on file    Marital Status: Never married    Allergies: No Known Allergies  Current Medications: Current Outpatient Medications  Medication Sig Dispense Refill   aspirin  EC 81 MG tablet Take 81 mg by mouth daily. Swallow whole.     lisinopril -hydrochlorothiazide  (ZESTORETIC ) 10-12.5 MG tablet TAKE 1 TABLET BY MOUTH DAILY. 90 tablet 1   meloxicam  (MOBIC ) 7.5 MG tablet Take 1 tablet (7.5 mg total) by mouth daily. Always take with food 90 tablet 1   Multiple Vitamin (MULTIVITAMIN PO) Take 1 tablet by mouth daily.     propranolol  (INDERAL ) 10 MG tablet Take 1  tablet (10 mg total) by mouth 2 (two) times daily as needed (anxiety, panic attacks, sleep). 90 tablet 0   sertraline  (ZOLOFT ) 25 MG tablet Take 1 tablet (25 mg total) by mouth daily. 30 tablet 1   No current facility-administered medications for this visit.    Objective: Psychiatric Specialty Exam: General Appearance: appears at stated age, casually dressed and groomed ***  Behavior: pleasant and cooperative ***  Psychomotor Activity: no psychomotor agitation or retardation noted ***  Eye Contact: fair *** Speech: normal amount, volume and fluency ***   Mood: euthymic *** Affect: congruent, pleasant and interactive ***  Thought Process: linear, goal directed, no circumstantial or tangential thought process noted, no racing thoughts or flight of ideas *** Descriptions of Associations: intact ***  Thought Content Hallucinations: denies AH, VH , does not appear responding  to stimuli *** Delusions: no paranoia, delusions of control, grandeur, ideas of reference, thought broadcasting, and magical thinking *** Suicidal Thoughts: denies SI, intention, plan *** Homicidal Thoughts: denies HI, intention, plan ***  Alertness/Orientation: alert and fully oriented ***  Insight: fair*** Judgment: fair***  Memory: intact ***  Executive Functions  Concentration: intact *** Attention Span: fair *** Recall: intact *** Fund of Knowledge: fair ***  Physical Exam *** General: Pleasant, well-appearing. No acute distress. Pulmonary: Normal effort. No wheezing or rales. Skin: No obvious rash or lesions.*** Neuro: A&Ox3.No focal deficit.  Review of Systems *** No reported symptoms   Metabolic Disorder Labs: Lab Results  Component Value Date   HGBA1C 5.6 07/28/2023   MPG 105 11/17/2012   No results found for: PROLACTIN Lab Results  Component Value Date   CHOL 181 07/28/2023   TRIG 73 07/28/2023   HDL 81 07/28/2023   CHOLHDL 2.2 07/28/2023   VLDL 16 11/17/2012   LDLCALC 86  07/28/2023   LDLCALC 96 02/06/2022   Lab Results  Component Value Date   TSH 0.514 01/14/2018    Therapeutic Level Labs: No results found for: LITHIUM No results found for: CBMZ No results found for: VALPROATE  Screenings:  GAD-7    Flowsheet Row Office Visit from 07/28/2023 in [facility] [place] - A Dept Of [place]. [facility] Office Visit from 03/30/2023 in [facility] [place] - A Dept Of [place]. [facility] Office Visit from 12[facility]23/2022 in [facility] [place] - A Dept Of [place]. [facility] Office Visit from 11/12/2022 in [facility] - A Dept Of [place]. [facility] Office Visit from 09/16/2022 in [facility] - A Dept Of [place]. [facility]  Total GAD-7 Score 13 11 11 16 12    PHQ2-9    Flowsheet Row Office Visit from 07/28/2023 in [facility] [place] - A Dept Of [place]. [facility] Office Visit from 03/30/2023 in [facility] [place] - A Dept Of [place]. [facility] Office Visit from 12/23/2022 in [facility] [place] - A Dept Of [place]. [facility] Office Visit from 11/12/2022 in [facility] [place] - A Dept Of [place]. [facility] Office Visit from 09/16/2022 in [facility] [place] - A Dept Of [place]. [facility]  PHQ-2 Total Score 4 4 2 4 3   PHQ-9 Total Score 13 16 12 17 13    Flowsheet Row ED from 01/24/2021 in [facility] Emergency Department at [facility]  C-SSRS RISK CATEGORY No Risk    Collaboration of Care: Case discussed with attending, see attending's attestation for additional information.  Ismael Franco, MD PGY-3 Psychiatry Resident

## 2024-04-11 NOTE — Telephone Encounter (Signed)
 Copied from CRM #[id number]. Topic: Appointments - Scheduling Inquiry for Clinic >> Apr 11, 2024  2:31 PM Gustabo D wrote:  Pt won't make appt today due to no transportation

## 2024-04-11 NOTE — Telephone Encounter (Signed)
 Noted

## 2024-04-11 NOTE — Telephone Encounter (Signed)
 Noted, Patient appointment rescheduled to 12/3 at 9:50 AM.

## 2024-04-13 ENCOUNTER — Encounter: Payer: Self-pay | Admitting: Nurse Practitioner

## 2024-04-13 ENCOUNTER — Ambulatory Visit: Attending: Nurse Practitioner | Admitting: Nurse Practitioner

## 2024-04-13 VITALS — BP 129/80 | HR 80 | Resp 19 | Ht 62.0 in | Wt 244.0 lb

## 2024-04-13 DIAGNOSIS — I10 Essential (primary) hypertension: Secondary | ICD-10-CM

## 2024-04-13 DIAGNOSIS — R7989 Other specified abnormal findings of blood chemistry: Secondary | ICD-10-CM

## 2024-04-13 DIAGNOSIS — R7309 Other abnormal glucose: Secondary | ICD-10-CM

## 2024-04-13 DIAGNOSIS — Z6841 Body Mass Index (BMI) 40.0 and over, adult: Secondary | ICD-10-CM

## 2024-04-13 MED ORDER — PHENTERMINE HCL 15 MG PO CAPS: 15.0000 mg | ORAL_CAPSULE | ORAL | 1 refills | Status: AC

## 2024-04-13 NOTE — Progress Notes (Signed)
 Assessment & Plan:  Ann [name] was seen today for flank pain, hypertension and hemorrhoids.  Diagnoses and all orders for this visit:  Primary hypertension -     CMP14+EGFR Continue all antihypertensives as prescribed.  Reminded to bring in blood pressure log for follow  up appointment.  RECOMMENDATIONS: DASH/Mediterranean Diets are healthier choices for HTN.     Morbid obesity with BMI of 40.0-44.9, adult (HCC) -     phentermine 15 MG capsule; Take 1 capsule (15 mg total) by mouth every morning. Discussed phentermine for weight loss. Noted potential side effects and interactions with current medications. Emphasized consistent use for 90 days. - Prescribed phentermine 15 mg capsule once daily in the morning. - Scheduled follow-up in 4 weeks for EKG and blood pressure check. - Advised against caffeine  intake while on phentermine.   Abnormal CBC -     CBC with Differential/Platelet  Elevated glucose -     Hemoglobin A1c    Patient has been counseled on age-appropriate routine health concerns for screening and prevention. These are reviewed and up-to-date. Referrals have been placed accordingly. Immunizations are up-to-date or declined.    Subjective:   Chief Complaint  Patient presents with   Flank Pain    Left sided.   Hypertension   Hemorrhoids    Ann [name] 63 y.o. female presents to office today for follow up to HTN and with left sided flank pain  She has a past medical history of Anemia, Anxiety, Dysrhythmia, GERD, Headache, Hypertension, and SVD    HTN Blood pressure is well controlled today. She takes zestoretic  10-12.5mg  daily.  BP Readings from Last 3 Encounters:  04/13/24 129/80  01/06/24 136/80  07/28/23 115/77     She has been experiencing left-sided lower back and leg pain since the Tuesday before Thanksgiving. The pain is sometimes alleviated by passing gas or changing positions, such as lying on her side.   She also experiences what she believes  are hemorrhoids, which she associates with frequent bowel movements. No constipation, and her bowel movements are not hard or large. She uses suppositories and cream for relief, which she finds helpful.   She has been trying to lose weight but food cravings and overeating with lack of exercise are contributing to her weight gain.  Wt Readings from Last 3 Encounters:  04/13/24 244 lb (110.7 kg)  01/06/24 245 lb 9.6 oz (111.4 kg)  07/28/23 242 lb (109.8 kg)       Review of Systems  Constitutional: Negative.   HENT: Negative.    Respiratory: Negative.    Cardiovascular: Negative.   Gastrointestinal:  Negative for abdominal pain, blood in stool, constipation, diarrhea and melena.       SEE HPI  Genitourinary:  Positive for flank pain.  Musculoskeletal:  Positive for back pain, joint pain and myalgias.  Neurological: Negative.   Psychiatric/Behavioral: Negative.      Past Medical History:  Diagnosis Date   Anemia    Anxiety    Dysrhythmia    palpitations   GERD (gastroesophageal reflux disease)    Headache(784.0)    Hypertension    SVD (spontaneous vaginal delivery)    x 3    Past Surgical History:  Procedure Laterality Date   ABDOMINAL HYSTERECTOMY N/A 03/23/2013   Procedure: HYSTERECTOMY ABDOMINAL;  Surgeon: Elveria Mungo, MD;  Location: [facility];  Service: Gynecology;  Laterality: N/A;   BILATERAL SALPINGECTOMY Bilateral 03/23/2013   Procedure: BILATERAL SALPINGECTOMY;  Surgeon: Elveria Mungo, MD;  Location: [facility];  Service: Gynecology;  Laterality: Bilateral;   COLONOSCOPY  08/2013   Pyrtle - polyp   COLPOSCOPY     FOOT SURGERY     right 5th toe-bone removed n1997   WISDOM TOOTH EXTRACTION      Family History  Problem Relation Age of Onset   Arthritis Mother    Stroke Mother    Hypertension Mother    Atrial fibrillation Mother    Arthritis Father    Cancer Father        prostate   Stroke Father    Alcohol abuse Brother    Stroke Brother     Cancer Paternal Aunt        breast and uterine   Colon cancer Neg Hx    Rectal cancer Neg Hx    Stomach cancer Neg Hx    Colon polyps Neg Hx    Esophageal cancer Neg Hx     Social History Reviewed with no changes to be made today.   Outpatient Medications Prior to Visit  Medication Sig Dispense Refill   aspirin  EC 81 MG tablet Take 81 mg by mouth daily. Swallow whole.     lisinopril -hydrochlorothiazide  (ZESTORETIC ) 10-12.5 MG tablet TAKE 1 TABLET BY MOUTH DAILY. 90 tablet 1   meloxicam  (MOBIC ) 7.5 MG tablet Take 1 tablet (7.5 mg total) by mouth daily. Always take with food 90 tablet 1   Multiple Vitamin (MULTIVITAMIN PO) Take 1 tablet by mouth daily.     propranolol  (INDERAL ) 10 MG tablet Take 1 tablet (10 mg total) by mouth 2 (two) times daily as needed (anxiety, panic attacks, sleep). 90 tablet 0   sertraline  (ZOLOFT ) 25 MG tablet Take 1 tablet (25 mg total) by mouth daily. 30 tablet 1   No facility-administered medications prior to visit.    No Known Allergies     Objective:    BP 129/80 (BP Location: Left Arm, Patient Position: Sitting, Cuff Size: Normal)   Pulse 80   Resp 19   Ht 5' 2 (1.575 m)   Wt 244 lb (110.7 kg)   LMP  (LMP Unknown)   SpO2 99%   BMI 44.63 kg/m  Wt Readings from Last 3 Encounters:  04/13/24 244 lb (110.7 kg)  01/06/24 245 lb 9.6 oz (111.4 kg)  07/28/23 242 lb (109.8 kg)    Physical Exam Vitals and nursing note reviewed.  Constitutional:      Appearance: She is well-developed.  HENT:     Head: Normocephalic and atraumatic.  Cardiovascular:     Rate and Rhythm: Normal rate and regular rhythm.     Heart sounds: Normal heart sounds. No murmur heard.    No friction rub. No gallop.  Pulmonary:     Effort: Pulmonary effort is normal. No tachypnea or respiratory distress.     Breath sounds: Normal breath sounds. No decreased breath sounds, wheezing, rhonchi or rales.  Chest:     Chest wall: No tenderness.  Musculoskeletal:         General: Normal range of motion.     Cervical back: Normal range of motion.  Skin:    General: Skin is warm and dry.  Neurological:     Mental Status: She is alert and oriented to person, place, and time.     Coordination: Coordination normal.  Psychiatric:        Behavior: Behavior normal. Behavior is cooperative.        Thought Content: Thought content normal.        Judgment:  Judgment normal.          Patient has been counseled extensively about nutrition and exercise as well as the importance of adherence with medications and regular follow-up. The patient was given clear instructions to go to ER or return to medical center if symptoms don't improve, worsen or new problems develop. The patient verbalized understanding.   Follow-up: Return in about 4 weeks (around 05/11/2024) for EKG/HTN/WEIGHTGAIN.   Haze LELON Servant, FNP-BC [facility] [place], [place] [phone]   04/13/2024, 5:49 PM

## 2024-04-14 LAB — CBC WITH DIFFERENTIAL/PLATELET
Basophils Absolute: 0 x10E3/uL (ref 0.0–0.2)
Basos: 1 %
EOS (ABSOLUTE): 0.2 x10E3/uL (ref 0.0–0.4)
Eos: 3 %
Hematocrit: 45.2 % (ref 34.0–46.6)
Hemoglobin: 14.8 g/dL (ref 11.1–15.9)
Immature Grans (Abs): 0 x10E3/uL (ref 0.0–0.1)
Immature Granulocytes: 0 %
Lymphocytes Absolute: 2.3 x10E3/uL (ref 0.7–3.1)
Lymphs: 39 %
MCH: 29.3 pg (ref 26.6–33.0)
MCHC: 32.7 g/dL (ref 31.5–35.7)
MCV: 90 fL (ref 79–97)
Monocytes Absolute: 0.5 x10E3/uL (ref 0.1–0.9)
Monocytes: 9 %
Neutrophils Absolute: 2.9 x10E3/uL (ref 1.4–7.0)
Neutrophils: 48 %
Platelets: 258 x10E3/uL (ref 150–450)
RBC: 5.05 x10E6/uL (ref 3.77–5.28)
RDW: 13.6 % (ref 11.7–15.4)
WBC: 5.9 x10E3/uL (ref 3.4–10.8)

## 2024-04-14 LAB — CMP14+EGFR
ALT: 15 IU/L (ref 0–32)
AST: 25 IU/L (ref 0–40)
Albumin: 4.5 g/dL (ref 3.9–4.9)
Alkaline Phosphatase: 89 IU/L (ref 49–135)
BUN/Creatinine Ratio: 14 (ref 12–28)
BUN: 13 mg/dL (ref 8–27)
Bilirubin Total: 0.5 mg/dL (ref 0.0–1.2)
CO2: 26 mmol/L (ref 20–29)
Calcium: 9.9 mg/dL (ref 8.7–10.3)
Chloride: 101 mmol/L (ref 96–106)
Creatinine, Ser: 0.94 mg/dL (ref 0.57–1.00)
Globulin, Total: 3 g/dL (ref 1.5–4.5)
Glucose: 90 mg/dL (ref 70–99)
Potassium: 4.1 mmol/L (ref 3.5–5.2)
Sodium: 141 mmol/L (ref 134–144)
Total Protein: 7.5 g/dL (ref 6.0–8.5)
eGFR: 69 mL/min/1.73 (ref >59)

## 2024-04-14 LAB — HEMOGLOBIN A1C
Est. average glucose Bld gHb Est-mCnc: 114 mg/dL
Hgb A1c MFr Bld: 5.6 % (ref 4.8–5.6)

## 2024-04-17 ENCOUNTER — Ambulatory Visit: Payer: Self-pay | Admitting: Nurse Practitioner

## 2024-04-20 ENCOUNTER — Ambulatory Visit (HOSPITAL_COMMUNITY): Admitting: Psychiatry

## 2024-04-22 ENCOUNTER — Other Ambulatory Visit: Payer: Self-pay

## 2024-04-26 ENCOUNTER — Other Ambulatory Visit (HOSPITAL_COMMUNITY): Payer: Self-pay

## 2024-05-09 ENCOUNTER — Other Ambulatory Visit (HOSPITAL_COMMUNITY): Payer: Self-pay

## 2024-05-23 ENCOUNTER — Other Ambulatory Visit: Payer: Self-pay | Admitting: Nurse Practitioner

## 2024-05-23 DIAGNOSIS — Z1231 Encounter for screening mammogram for malignant neoplasm of breast: Secondary | ICD-10-CM

## 2024-05-23 NOTE — Progress Notes (Unsigned)
 " Psychiatric Adult Assessment Progress Note  Patient Identification: Ann [name] MRN:  [id number] Date of Evaluation:  05/23/2024  Assessment: [facility] due to ongoing symptoms of depression and generalized anxiety.  In the prior visit, we started Zoloft  and propranolol . Today, ***   Plan:  # MDD - Start Zoloft  25 mg daily - D/c Celexa  10 mg  # GAD - Start propranolol  10 mg TID PRN - D/c Atarax  10-20 mg TID PRN   Patient was given contact information for behavioral health clinic and was instructed to call 911 for emergencies.   Identifying Information: Ann [name] is a 64 y.o. female with a history of depression and anxiety who presents in person to [facility] for depression and anxiety.    Subjective:  History of Present Illness:    Patient seen ***.  Patient reports feeling *** today. Since the previous visit, ***. Stressors include ***.   Regarding psychiatric symptoms, ***. Patient reports the medications are ***. Patient reports the following adverse effects: ***.   Patient reports *** sleep, ***. Patient reports *** appetite, ***.   Patient denies current SI, HI, and AVH. ***  Substance use: *** Tobacco: *** Alcohol: *** Illicit substances: ***   Past Psychiatric History:  Diagnoses: depression and anxiety by PCP in 2022 Previous medications: denies Previous psychiatrist: denies Previous therapist: previously in 2022- virtual  Hospitalizations: denies Suicide attempts: denies SIB: denies Current access to guns: denies  Hx of violence towards others: denies Hx of trauma/abuse: denies  Substance use:  Tobacco: denies Alcohol: occasionally like every other week, 2 glass of wine at a time Marijuana: denies Other illicit substances: denies  Past Medical History:  Medications: ASA, Zestoretic  PCP: H[facility] Servant  Family Psychiatric History: none diagnosed  Social History:  Living: [place],  living with mother and daughter and grandson Occupation: unemployed Relationship: single Children: 2 children Support: mother and children Legal History: denies  Past Medical History:  Past Medical History:  Diagnosis Date   Anemia    Anxiety    Dysrhythmia    palpitations   GERD (gastroesophageal reflux disease)    Headache(784.0)    Hypertension    SVD (spontaneous vaginal delivery)    x 3    Past Surgical History:  Procedure Laterality Date   ABDOMINAL HYSTERECTOMY N/A 03/23/2013   Procedure: HYSTERECTOMY ABDOMINAL;  Surgeon: Elveria Mungo, MD;  Location: [facility];  Service: Gynecology;  Laterality: N/A;   BILATERAL SALPINGECTOMY Bilateral 03/23/2013   Procedure: BILATERAL SALPINGECTOMY;  Surgeon: Elveria Mungo, MD;  Location: [facility];  Service: Gynecology;  Laterality: Bilateral;   COLONOSCOPY  08/2013   Pyrtle - polyp   COLPOSCOPY     FOOT SURGERY     right 5th toe-bone removed n1997   WISDOM TOOTH EXTRACTION      Family History:  Family History  Problem Relation Age of Onset   Arthritis Mother    Stroke Mother    Hypertension Mother    Atrial fibrillation Mother    Arthritis Father    Cancer Father        prostate   Stroke Father    Alcohol abuse Brother    Stroke Brother    Cancer Paternal Aunt        breast and uterine   Colon cancer Neg Hx    Rectal cancer Neg Hx    Stomach cancer Neg Hx    Colon polyps Neg Hx    Esophageal cancer Neg Hx  Social History   Socioeconomic History   Marital status: Single    Spouse name: Not on file   Number of children: Not on file   Years of education: Not on file   Highest education level: Some college, no degree  Occupational History   Not on file  Tobacco Use   Smoking status: Never   Smokeless tobacco: Never  Vaping Use   Vaping status: Never Used  Substance and Sexual Activity   Alcohol use: Yes    Alcohol/week: 7.0 standard drinks of alcohol    Types: 7 Glasses of wine per week     Comment: social drinker   Drug use: No   Sexual activity: Not Currently    Birth control/protection: Surgical    Comment: HYSTERECTOMY  Other Topics Concern   Not on file  Social History Narrative   Not on file   Social Drivers of Health   Tobacco Use: Low Risk (04/13/2024)   Patient History    Smoking Tobacco Use: Never    Smokeless Tobacco Use: Never    Passive Exposure: Not on file  Financial Resource Strain: High Risk (01/06/2024)   Overall Financial Resource Strain (CARDIA)    Difficulty of Paying Living Expenses: Very hard  Food Insecurity: Food Insecurity Present (01/06/2024)   Epic    Worried About Programme Researcher, Broadcasting/film/video in the Last Year: Often true    Ran Out of Food in the Last Year: Often true  Transportation Needs: No Transportation Needs (01/06/2024)   Epic    Lack of Transportation (Medical): No    Lack of Transportation (Non-Medical): No  Physical Activity: Inactive (01/06/2024)   Exercise Vital Sign    Days of Exercise per Week: 0 days    Minutes of Exercise per Session: Not on file  Stress: Stress Concern Present (01/06/2024)   [employer] of Occupational Health - Occupational Stress Questionnaire    Feeling of Stress: Rather much  Social Connections: Socially Isolated (01/06/2024)   Social Connection and Isolation Panel    Frequency of Communication with Friends and Family: More than three times a week    Frequency of Social Gatherings with Friends and Family: Never    Attends Religious Services: Never    Database Administrator or Organizations: No    Attends Engineer, Structural: Not on file    Marital Status: Never married  Depression (PHQ2-9): High Risk (04/13/2024)   Depression (PHQ2-9)    PHQ-2 Score: 12  Alcohol Screen: Low Risk (01/06/2024)   Alcohol Screen    Last Alcohol Screening Score (AUDIT): 1  Housing: High Risk (01/06/2024)   Epic    Unable to Pay for Housing in the Last Year: Yes    Number of Times Moved in the Last Year: Not  on file    Homeless in the Last Year: No  Utilities: Not At Risk (03/30/2023)   AHC Utilities    Threatened with loss of utilities: No  Health Literacy: Adequate Health Literacy (03/30/2023)   B1300 Health Literacy    Frequency of need for help with medical instructions: Never    Allergies: No Known Allergies  Current Medications: Current Outpatient Medications  Medication Sig Dispense Refill   aspirin  EC 81 MG tablet Take 81 mg by mouth daily. Swallow whole.     lisinopril -hydrochlorothiazide  (ZESTORETIC ) 10-12.5 MG tablet TAKE 1 TABLET BY MOUTH DAILY. 90 tablet 1   meloxicam  (MOBIC ) 7.5 MG tablet Take 1 tablet (7.5 mg total) by mouth daily. Always  take with food 90 tablet 1   Multiple Vitamin (MULTIVITAMIN PO) Take 1 tablet by mouth daily.     phentermine  15 MG capsule Take 1 capsule (15 mg total) by mouth every morning. 30 capsule 1   propranolol  (INDERAL ) 10 MG tablet Take 1 tablet (10 mg total) by mouth 2 (two) times daily as needed (anxiety, panic attacks, sleep). 90 tablet 0   sertraline  (ZOLOFT ) 25 MG tablet Take 1 tablet (25 mg total) by mouth daily. 30 tablet 1   No current facility-administered medications for this visit.    Objective: ***  Psychiatric Specialty Exam General Appearance: appears at stated age, casually dressed and groomed   Behavior: pleasant and cooperative   Psychomotor Activity: no psychomotor agitation or retardation noted   Eye Contact: fair  Speech: normal amount, tone, volume and fluency    Mood: euthymic  Affect: congruent, pleasant and interactive   Thought Process: linear, goal directed, no circumstantial or tangential thought process noted, no racing thoughts or flight of ideas  Descriptions of Associations: intact   Thought Content Hallucinations: denies AH, VH , does not appear responding to stimuli  Delusions: no paranoia, delusions of control, grandeur, ideas of reference, thought broadcasting, and magical thinking  Suicidal  Thoughts: denies SI, intention, plan  Homicidal Thoughts: denies HI, intention, plan   Alertness/Orientation: alert and fully oriented   Insight: fair Judgment: fair  Memory: intact   Executive Functions  Concentration: intact  Attention Span: fair  Recall: intact  Fund of Knowledge: fair   Physical Exam  General: Pleasant, well-appearing. No acute distress. Pulmonary: Normal effort. No wheezing or rales. Skin: No obvious rash or lesions. Neuro: A&Ox3.No focal deficit.   Review of Systems  No reported symptoms  Metabolic Disorder Labs: Lab Results  Component Value Date   HGBA1C 5.6 04/13/2024   MPG 105 11/17/2012   No results found for: PROLACTIN Lab Results  Component Value Date   CHOL 181 07/28/2023   TRIG 73 07/28/2023   HDL 81 07/28/2023   CHOLHDL 2.2 07/28/2023   VLDL 16 11/17/2012   LDLCALC 86 07/28/2023   LDLCALC 96 02/06/2022   Lab Results  Component Value Date   TSH 0.514 01/14/2018    Therapeutic Level Labs: No results found for: LITHIUM No results found for: CBMZ No results found for: VALPROATE  Screenings:  GAD-7    Flowsheet Row Office Visit from 04/13/2024 in [facility] [place] - A Dept Of [place]. [facility] Office Visit from 07/28/2023 in [facility] [place] - A Dept Of [facility] [facility]. [facility] Office Visit from 03/30/2023 in [facility] [place] - A Dept Of [place] [facility]. [facility] Office Visit from 12/23/2022 in [facility] [place] - A Dept Of [place] [facility]. [facility] Office Visit from 11/12/2022 in [facility] [place] - A Dept Of [place] [facility]. [facility]  Total GAD-7 Score 13 13 11 11 16    PHQ2-9    Flowsheet Row Office Visit from 04/13/2024 in [facility] [place] - A [facility] [place]. [facility] Office Visit from 07/28/2023 in [facility] [place] - A Dept Of [place] [facility]. [facility] Office Visit  from 03/30/2023 in [facility] [place] - A Dept Of [place] [facility]. [facility] Office Visit from 12/23/2022 in [facility] [place] - A Dept Of [place] [facility]. [facility] Office Visit  from 11/12/2022 in [facility] [place] - A Dept Of [facility]. [facility]  PHQ-2 Total Score 3 4 4 2 4   PHQ-9 Total Score 12 13 16 12 17    Flowsheet Row ED from 01/24/2021 in [facility] Emergency Department at [facility]  C-SSRS RISK CATEGORY No Risk    Collaboration of Care: Case discussed with attending, see attending's attestation for additional information.  Ismael Franco, MD PGY-3 Psychiatry Resident  "

## 2024-05-25 ENCOUNTER — Other Ambulatory Visit (HOSPITAL_COMMUNITY): Payer: Self-pay

## 2024-05-25 ENCOUNTER — Ambulatory Visit: Admitting: Nurse Practitioner

## 2024-05-25 ENCOUNTER — Other Ambulatory Visit (HOSPITAL_COMMUNITY): Payer: Self-pay | Admitting: Psychiatry

## 2024-05-25 DIAGNOSIS — F331 Major depressive disorder, recurrent, moderate: Secondary | ICD-10-CM

## 2024-05-25 DIAGNOSIS — F411 Generalized anxiety disorder: Secondary | ICD-10-CM

## 2024-05-25 MED ORDER — SERTRALINE HCL 25 MG PO TABS
25.0000 mg | ORAL_TABLET | Freq: Every day | ORAL | 1 refills | Status: DC
  Filled 2024-05-25 – 2024-05-27 (×2): qty 30, 30d supply, fill #0

## 2024-05-27 ENCOUNTER — Other Ambulatory Visit: Payer: Self-pay

## 2024-05-30 ENCOUNTER — Other Ambulatory Visit (HOSPITAL_COMMUNITY): Payer: Self-pay

## 2024-05-30 ENCOUNTER — Ambulatory Visit (HOSPITAL_COMMUNITY): Admitting: Psychiatry

## 2024-05-30 ENCOUNTER — Other Ambulatory Visit: Payer: Self-pay

## 2024-05-30 VITALS — BP 133/83 | Wt 242.0 lb

## 2024-05-30 DIAGNOSIS — F411 Generalized anxiety disorder: Secondary | ICD-10-CM

## 2024-05-30 DIAGNOSIS — F331 Major depressive disorder, recurrent, moderate: Secondary | ICD-10-CM | POA: Diagnosis not present

## 2024-05-30 MED ORDER — PROPRANOLOL HCL 10 MG PO TABS
10.0000 mg | ORAL_TABLET | Freq: Two times a day (BID) | ORAL | 0 refills | Status: AC | PRN
  Filled 2024-05-30 (×2): qty 90, 45d supply, fill #0

## 2024-05-30 MED ORDER — SERTRALINE HCL 50 MG PO TABS
50.0000 mg | ORAL_TABLET | Freq: Every day | ORAL | 0 refills | Status: AC
  Filled 2024-05-30 (×2): qty 60, 60d supply, fill #0

## 2024-05-30 NOTE — Progress Notes (Signed)
 " Psychiatric Adult Assessment Progress Note  Patient Identification: Ann [name] MRN:  [id number] Date of Evaluation:  05/30/2024  Assessment: [facility] due to ongoing symptoms of depression and generalized anxiety.  In the prior visit, we started Zoloft  and propranolol .   Today, patient presents with ongoing depression and neurovegetative symptoms of insomnia and low energy. Patient has multiple psychosocial stressors including caregiver burden and unemployement which is likely also impacting her symptomatology.  She tolerated starting Zoloft  so to aid with her ongoing depressive symptoms, will increase her Zoloft  to 50 mg. Reassuringly, her propranolol  seems to be aiding with situational anxiety.   Behavioral activation also completed and patient created SMART goals regarding physical activity along with the plan to get re-established with her therapist as she can greatly benefit from reframing thoughts and grounding techniques. Will reassess depression symptoms and followup on therapy and SMART goal in the following appt.    Plan:  # MDD - Increase Zoloft  to 50 mg daily - Plan to get re-established with therapy with Paige Cozart  # GAD - Continue propranolol  10 mg TID PRN  Patient was given contact information for behavioral health clinic and was instructed to call 911 for emergencies.   Identifying Information: Ann [name] is a 64 y.o. female with a history of depression and anxiety who presents in person to [facility] for depression and anxiety.    Subjective:  History of Present Illness:    Patient reports feeling okay today. Regarding psychiatric symptoms, she notes depression is still very present. Patient reports poor sleep, reporting 4-5 hours nightly. She reports difficulty staying asleep and attributes this to having to care for her mother in the middle of the night sometimes. Patient reports good appetite.  She reports  anxiety when she has to plan tasks or interview and notes benefit from propranolol  and reports taking this once a day. She notes low energy caring for her mother. Patient denies HA, dizziness, and nausea with starting Zoloft .   When asked about her relaxation techniques, she states that she eats but she is motivated to start walking. We discussed the goal of walking three days in the week for 20 minutes in the mornings. We discussed barriers to exercise like cold weather and discussed alternate plan of exercising indoors. We discussed the option of getting back into therapy and she is agreeable.  Since the previous visit, she notes still caring for her mother and she does note this being stressful. 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  Patient denies current SI, HI, and AVH.   Past Psychiatric History:  Diagnoses: depression and anxiety by PCP in 2022 Previous medications: denies Previous psychiatrist: denies Previous therapist: previously in 2022- virtual  Hospitalizations: denies Suicide attempts: denies SIB: denies Current access to guns: denies  Hx of violence towards others: denies Hx of trauma/abuse: denies  Substance use:  Tobacco: denies Alcohol: occasionally like every other week, 2 glass of wine at a time Marijuana: denies Other illicit substances: denies  Past Medical History:  Medications: ASA, Zestoretic  PCP: [facility] Servant  Family Psychiatric History: none diagnosed  Social History:  Living: , living with mother and daughter and grandson Occupation: unemployed, since 2022 Relationship: single Children: 2 children Support: mother and children Legal History: denies  Past Medical History:  Past Medical History:  Diagnosis Date   Anemia    Anxiety    Dysrhythmia    palpitations  GERD (gastroesophageal reflux disease)    Headache(784.0)    Hypertension    SVD (spontaneous vaginal  delivery)    x 3    Past Surgical History:  Procedure Laterality Date   ABDOMINAL HYSTERECTOMY N/A 03/23/2013   Procedure: HYSTERECTOMY ABDOMINAL;  Surgeon: Elveria Mungo, MD;  Location: [facility];  Service: Gynecology;  Laterality: N/A;   BILATERAL SALPINGECTOMY Bilateral 03/23/2013   Procedure: BILATERAL SALPINGECTOMY;  Surgeon: Elveria Mungo, MD;  Location: [facility];  Service: Gynecology;  Laterality: Bilateral;   COLONOSCOPY  08/2013   Pyrtle - polyp   COLPOSCOPY     FOOT SURGERY     right 5th toe-bone removed n1997   WISDOM TOOTH EXTRACTION      Family History:  Family History  Problem Relation Age of Onset   Arthritis Mother    Stroke Mother    Hypertension Mother    Atrial fibrillation Mother    Arthritis Father    Cancer Father        prostate   Stroke Father    Alcohol abuse Brother    Stroke Brother    Cancer Paternal Aunt        breast and uterine   Colon cancer Neg Hx    Rectal cancer Neg Hx    Stomach cancer Neg Hx    Colon polyps Neg Hx    Esophageal cancer Neg Hx     Social History   Socioeconomic History   Marital status: Single    Spouse name: Not on file   Number of children: Not on file   Years of education: Not on file   Highest education level: Some college, no degree  Occupational History   Not on file  Tobacco Use   Smoking status: Never   Smokeless tobacco: Never  Vaping Use   Vaping status: Never Used  Substance and Sexual Activity   Alcohol use: Yes    Alcohol/week: 7.0 standard drinks of alcohol    Types: 7 Glasses of wine per week    Comment: social drinker   Drug use: No   Sexual activity: Not Currently    Birth control/protection: Surgical    Comment: HYSTERECTOMY  Other Topics Concern   Not on file  Social History Narrative   Not on file   Social Drivers of Health   Tobacco Use: Low Risk (04/13/2024)   Patient History    Smoking Tobacco Use: Never    Smokeless Tobacco Use: Never    Passive Exposure:  Not on file  Financial Resource Strain: High Risk (01/06/2024)   Overall Financial Resource Strain (CARDIA)    Difficulty of Paying Living Expenses: Very hard  Food Insecurity: Food Insecurity Present (01/06/2024)   Epic    Worried About Programme Researcher, Broadcasting/film/video in the Last Year: Often true    Ran Out of Food in the Last Year: Often true  Transportation Needs: No Transportation Needs (01/06/2024)   Epic    Lack of Transportation (Medical): No    Lack of Transportation (Non-Medical): No  Physical Activity: Inactive (01/06/2024)   Exercise Vital Sign    Days of Exercise per Week: 0 days    Minutes of Exercise per Session: Not on file  Stress: Stress Concern Present (01/06/2024)   [employer] of Occupational Health - Occupational Stress Questionnaire    Feeling of Stress: Rather much  Social Connections: Socially Isolated (01/06/2024)   Social Connection and Isolation Panel    Frequency of Communication with Friends and Family: More  than three times a week    Frequency of Social Gatherings with Friends and Family: Never    Attends Religious Services: Never    Database Administrator or Organizations: No    Attends Engineer, Structural: Not on file    Marital Status: Never married  Depression (PHQ2-9): High Risk (04/13/2024)   Depression (PHQ2-9)    PHQ-2 Score: 12  Alcohol Screen: Low Risk (01/06/2024)   Alcohol Screen    Last Alcohol Screening Score (AUDIT): 1  Housing: High Risk (01/06/2024)   Epic    Unable to Pay for Housing in the Last Year: Yes    Number of Times Moved in the Last Year: Not on file    Homeless in the Last Year: No  Utilities: Not At Risk (03/30/2023)   AHC Utilities    Threatened with loss of utilities: No  Health Literacy: Adequate Health Literacy (03/30/2023)   B1300 Health Literacy    Frequency of need for help with medical instructions: Never    Allergies: No Known Allergies  Current Medications: Current Outpatient Medications  Medication Sig  Dispense Refill   aspirin  EC 81 MG tablet Take 81 mg by mouth daily. Swallow whole.     lisinopril -hydrochlorothiazide  (ZESTORETIC ) 10-12.5 MG tablet TAKE 1 TABLET BY MOUTH DAILY. 90 tablet 1   meloxicam  (MOBIC ) 7.5 MG tablet Take 1 tablet (7.5 mg total) by mouth daily. Always take with food 90 tablet 1   Multiple Vitamin (MULTIVITAMIN PO) Take 1 tablet by mouth daily.     phentermine  15 MG capsule Take 1 capsule (15 mg total) by mouth every morning. 30 capsule 1   propranolol  (INDERAL ) 10 MG tablet Take 1 tablet (10 mg total) by mouth 2 (two) times daily as needed (anxiety, panic attacks, sleep). 90 tablet 0   sertraline  (ZOLOFT ) 25 MG tablet Take 1 tablet (25 mg total) by mouth daily. 30 tablet 1   No current facility-administered medications for this visit.    Objective:   Psychiatric Specialty Exam General Appearance: appears at stated age, casually dressed and groomed   Behavior: pleasant and cooperative   Psychomotor Activity: no psychomotor agitation or retardation noted   Eye Contact: fair  Speech: normal amount, tone, volume and fluency    Mood: euthymic  Affect: congruent, pleasant and interactive   Thought Process: linear, goal directed, no circumstantial or tangential thought process noted, no racing thoughts or flight of ideas  Descriptions of Associations: intact   Thought Content Hallucinations: denies AH, VH , does not appear responding to stimuli  Delusions: no paranoia, delusions of control, grandeur, ideas of reference, thought broadcasting, and magical thinking  Suicidal Thoughts: denies SI, intention, plan  Homicidal Thoughts: denies HI, intention, plan   Alertness/Orientation: alert and fully oriented   Insight: fair Judgment: fair  Memory: intact   Executive Functions  Concentration: intact  Attention Span: fair  Recall: intact  Fund of Knowledge: fair   Physical Exam  General: Pleasant, well-appearing. No acute distress. Pulmonary: Normal  effort. No wheezing or rales. Skin: No obvious rash or lesions. Neuro: A&Ox3.No focal deficit.   Review of Systems  No reported symptoms  Metabolic Disorder Labs: Lab Results  Component Value Date   HGBA1C 5.6 04/13/2024   MPG 105 11/17/2012   No results found for: PROLACTIN Lab Results  Component Value Date   CHOL 181 07/28/2023   TRIG 73 07/28/2023   HDL 81 07/28/2023   CHOLHDL 2.2 07/28/2023   VLDL 16 11/17/2012  LDLCALC 86 07/28/2023   LDLCALC 96 02/06/2022   Lab Results  Component Value Date   TSH 0.514 01/14/2018    Therapeutic Level Labs: No results found for: LITHIUM No results found for: CBMZ No results found for: VALPROATE  Screenings:  GAD-7    Flowsheet Row Office Visit from 04/13/2024 in [facility] [place] - A Dept Of [place]. [facility] Office Visit from 07/28/2023 in [facility] [place] - A Dept Of [place] [facility]. [facility] Office Visit from 03/30/2023 in [facility] [place] - A Dept Of [place] [facility]. [facility] Office Visit from 12/23/2022 in [facility] [place] - A Dept Of [place]. [facility] Office Visit from 11/12/2022 in [facility] [place] - A Dept Of [place]. [facility]  Total GAD-7 Score 13 13 11 11 16    PHQ2-9    Flowsheet Row Office Visit from 04/13/2024 in [facility] [place] - A Dept Of [place]. [facility] Office Visit from 07/28/2023 in [facility] [place] - A Dept Of [place] [facility]. [facility] Office Visit from 03/30/2023 in [facility] [place] - A Dept Of [place] [facility]. [facility] Office Visit from 12/23/2022 in [facility] [place] - A Dept Of [place] [facility]. [facility] Office Visit from 11/12/2022 in [facility] [place] - A Dept Of [place]. [facility]  PHQ-2 Total Score 3 4 4 2 4   PHQ-9 Total Score 12 13 16 12 17    Flowsheet Row ED  from 01/24/2021 in [facility] Department at [facility]  C-SSRS RISK CATEGORY No Risk    Collaboration of Care: Case discussed with attending, see attending's attestation for additional information.  Ismael Franco, MD PGY-3 Psychiatry Resident  "

## 2024-05-31 NOTE — Addendum Note (Signed)
 Addended by: CARVIN CROCK on: 05/31/2024 11:38 AM   Modules accepted: Level of Service

## 2024-06-03 ENCOUNTER — Ambulatory Visit

## 2024-06-03 DIAGNOSIS — Z1231 Encounter for screening mammogram for malignant neoplasm of breast: Secondary | ICD-10-CM

## 2024-06-08 ENCOUNTER — Ambulatory Visit: Admitting: Nurse Practitioner

## 2024-06-15 ENCOUNTER — Ambulatory Visit

## 2024-06-21 ENCOUNTER — Ambulatory Visit: Admitting: Nurse Practitioner

## 2024-06-22 ENCOUNTER — Ambulatory Visit

## 2024-07-11 ENCOUNTER — Ambulatory Visit (HOSPITAL_COMMUNITY): Admitting: Psychiatry
